# Patient Record
Sex: Male | Born: 1935 | Race: White | Hispanic: No | Smoking: Former smoker
Health system: Southern US, Community
[De-identification: ages and names within clinical notes are randomized; demographics above are authoritative.]

## PROBLEM LIST (undated history)

## (undated) DIAGNOSIS — Z87442 Personal history of urinary calculi: Secondary | ICD-10-CM

## (undated) DIAGNOSIS — M5030 Other cervical disc degeneration, unspecified cervical region: Secondary | ICD-10-CM

## (undated) DIAGNOSIS — J189 Pneumonia, unspecified organism: Secondary | ICD-10-CM

## (undated) DIAGNOSIS — Z9289 Personal history of other medical treatment: Secondary | ICD-10-CM

## (undated) DIAGNOSIS — I499 Cardiac arrhythmia, unspecified: Secondary | ICD-10-CM

## (undated) DIAGNOSIS — T4145XA Adverse effect of unspecified anesthetic, initial encounter: Secondary | ICD-10-CM

## (undated) DIAGNOSIS — R0981 Nasal congestion: Secondary | ICD-10-CM

## (undated) DIAGNOSIS — M1990 Unspecified osteoarthritis, unspecified site: Secondary | ICD-10-CM

## (undated) DIAGNOSIS — K5903 Drug induced constipation: Secondary | ICD-10-CM

## (undated) DIAGNOSIS — Z8719 Personal history of other diseases of the digestive system: Secondary | ICD-10-CM

## (undated) DIAGNOSIS — G5600 Carpal tunnel syndrome, unspecified upper limb: Secondary | ICD-10-CM

## (undated) DIAGNOSIS — T402X5A Adverse effect of other opioids, initial encounter: Secondary | ICD-10-CM

## (undated) DIAGNOSIS — E785 Hyperlipidemia, unspecified: Secondary | ICD-10-CM

## (undated) DIAGNOSIS — G629 Polyneuropathy, unspecified: Secondary | ICD-10-CM

## (undated) DIAGNOSIS — K219 Gastro-esophageal reflux disease without esophagitis: Secondary | ICD-10-CM

## (undated) DIAGNOSIS — N400 Enlarged prostate without lower urinary tract symptoms: Secondary | ICD-10-CM

## (undated) DIAGNOSIS — C801 Malignant (primary) neoplasm, unspecified: Secondary | ICD-10-CM

## (undated) DIAGNOSIS — M8580 Other specified disorders of bone density and structure, unspecified site: Secondary | ICD-10-CM

## (undated) DIAGNOSIS — I10 Essential (primary) hypertension: Secondary | ICD-10-CM

## (undated) DIAGNOSIS — M353 Polymyalgia rheumatica: Secondary | ICD-10-CM

## (undated) DIAGNOSIS — D649 Anemia, unspecified: Secondary | ICD-10-CM

## (undated) HISTORY — PX: FINGER SURGERY: SHX640

## (undated) HISTORY — PX: TONSILLECTOMY: SUR1361

## (undated) HISTORY — PX: HERNIA REPAIR: SHX51

## (undated) HISTORY — PX: EYE SURGERY: SHX253

## (undated) HISTORY — PX: CARPAL TUNNEL RELEASE: SHX101

## (undated) HISTORY — PX: COLONOSCOPY W/ POLYPECTOMY: SHX1380

## (undated) HISTORY — PX: CARDIAC CATHETERIZATION: SHX172

## (undated) HISTORY — PX: BACK SURGERY: SHX140

---
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
## 1972-05-02 HISTORY — PX: KNEE SURGERY: SHX244

## 2004-05-02 HISTORY — PX: REPLACEMENT TOTAL KNEE: SUR1224

## 2009-05-02 DIAGNOSIS — T8859XA Other complications of anesthesia, initial encounter: Secondary | ICD-10-CM

## 2009-05-02 HISTORY — PX: REPLACEMENT TOTAL KNEE: SUR1224

## 2009-05-02 HISTORY — DX: Other complications of anesthesia, initial encounter: T88.59XA

## 2015-03-09 ENCOUNTER — Other Ambulatory Visit (HOSPITAL_COMMUNITY): Payer: Self-pay | Admitting: Neurosurgery

## 2015-03-17 ENCOUNTER — Encounter (HOSPITAL_COMMUNITY): Payer: Self-pay

## 2015-03-17 ENCOUNTER — Encounter (HOSPITAL_COMMUNITY)
Admission: RE | Admit: 2015-03-17 | Discharge: 2015-03-17 | Disposition: A | Discharge status: Home / Self Care | Payer: Medicare Other | Source: Ambulatory Visit | Attending: Neurosurgery | Admitting: Neurosurgery

## 2015-03-17 HISTORY — DX: Essential (primary) hypertension: I10

## 2015-03-17 HISTORY — DX: Drug induced constipation: K59.03

## 2015-03-17 HISTORY — DX: Adverse effect of unspecified anesthetic, initial encounter: T41.45XA

## 2015-03-17 HISTORY — DX: Nasal congestion: R09.81

## 2015-03-17 HISTORY — DX: Personal history of other diseases of the digestive system: Z87.19

## 2015-03-17 HISTORY — DX: Carpal tunnel syndrome, unspecified upper limb: G56.00

## 2015-03-17 HISTORY — DX: Other cervical disc degeneration, unspecified cervical region: M50.30

## 2015-03-17 HISTORY — DX: Benign prostatic hyperplasia without lower urinary tract symptoms: N40.0

## 2015-03-17 HISTORY — DX: Gastro-esophageal reflux disease without esophagitis: K21.9

## 2015-03-17 HISTORY — DX: Unspecified osteoarthritis, unspecified site: M19.90

## 2015-03-17 HISTORY — DX: Anemia, unspecified: D64.9

## 2015-03-17 HISTORY — DX: Cardiac arrhythmia, unspecified: I49.9

## 2015-03-17 HISTORY — DX: Malignant (primary) neoplasm, unspecified: C80.1

## 2015-03-17 HISTORY — DX: Hyperlipidemia, unspecified: E78.5

## 2015-03-17 HISTORY — DX: Adverse effect of other opioids, initial encounter: T40.2X5A

## 2015-03-17 HISTORY — DX: Polyneuropathy, unspecified: G62.9

## 2015-03-17 HISTORY — DX: Other specified disorders of bone density and structure, unspecified site: M85.80

## 2015-03-17 HISTORY — DX: Polymyalgia rheumatica: M35.3

## 2015-03-17 LAB — BASIC METABOLIC PANEL
ANION GAP: 6 (ref 5–15)
BUN: 14 mg/dL (ref 6–20)
CO2: 27 mmol/L (ref 22–32)
Calcium: 9.4 mg/dL (ref 8.9–10.3)
Chloride: 107 mmol/L (ref 101–111)
Creatinine, Ser: 1.06 mg/dL (ref 0.61–1.24)
GFR calc non Af Amer: >60 mL/min (ref >60)
Glucose, Bld: 95 mg/dL (ref 65–99)
POTASSIUM: 4.4 mmol/L (ref 3.5–5.1)
SODIUM: 140 mmol/L (ref 135–145)

## 2015-03-17 LAB — CBC
HCT: 39.5 % (ref 39.0–52.0)
Hemoglobin: 13 g/dL (ref 13.0–17.0)
MCH: 33.5 pg (ref 26.0–34.0)
MCHC: 32.9 g/dL (ref 30.0–36.0)
MCV: 101.8 fL — AB (ref 78.0–100.0)
PLATELETS: 198 10*3/uL (ref 150–400)
RBC: 3.88 MIL/uL — AB (ref 4.22–5.81)
RDW: 15.8 % — ABNORMAL HIGH (ref 11.5–15.5)
WBC: 10.9 10*3/uL — ABNORMAL HIGH (ref 4.0–10.5)

## 2015-03-17 LAB — SURGICAL PCR SCREEN
MRSA, PCR: NEGATIVE
STAPHYLOCOCCUS AUREUS: NEGATIVE

## 2015-03-17 NOTE — Pre-Procedure Instructions (Signed)
    Kevin Carlson.  03/17/2015    Your procedure is scheduled on Friday, November 18.  Report to [facility] Admitting at 9:30 A.M.  Call this number if you have problems the morning of surgery:  [phone]   Remember:  Do not eat food or drink liquids after midnight Thursday, November 17.  Take these medicines the morning of surgery with A SIP OF WATER : gabapentin (NEURONTIN), lansoprazole (PREVACID), tamsulosin (FLOMAX).                  May take if needed: HYDROcodone-acetaminophen [facility])   Do not wear jewelry, make-up or nail polish.   Do not wear lotions, powders, or perfumes.               Men may shave face and neck.   Do not bring valuables to the hospital.   [facility] is not responsible for any belongings or valuables.  Contacts, dentures or bridgework may not be worn into surgery.  Leave your suitcase in the car.  After surgery it may be brought to your room.  For patients admitted to the hospital, discharge time will be determined by your treatment team.  Patients discharged the day of surgery will not be allowed to drive home.   Name and phone number of your driver:    Special instructions:  Review  [place] - Preparing For Surgery.  Please read over the following fact sheets that you were given. Pain Booklet, Coughing and Deep Breathing and Surgical Site Infection Prevention

## 2015-03-17 NOTE — Progress Notes (Signed)
Kevin Carlson reports that he had bleeding from esophagus after "having tube taken out of my throat in 2011 at [facility]."  I requested anesthesia records and D/C notes from that surgery.  I also requested EKG, Stress Test, Echo from Dr Alroy Dust, patients cardiologist.

## 2015-03-19 MED ORDER — CEFAZOLIN SODIUM-DEXTROSE 2-3 GM-% IV SOLR
2.0000 g | INTRAVENOUS | Status: AC
  Administered 2015-03-20: 2 g via INTRAVENOUS
  Filled 2015-03-19: qty 50

## 2015-03-19 NOTE — Discharge Instructions (Signed)

## 2015-03-20 ENCOUNTER — Ambulatory Visit (HOSPITAL_COMMUNITY): Payer: Medicare Other

## 2015-03-20 ENCOUNTER — Inpatient Hospital Stay (HOSPITAL_COMMUNITY)
Admission: AD | Admit: 2015-03-20 | Discharge: 2015-03-21 | DRG: 515 | Disposition: A | Discharge status: Home / Self Care | Payer: Medicare Other | Source: Ambulatory Visit | Attending: Neurosurgery | Admitting: Neurosurgery

## 2015-03-20 ENCOUNTER — Encounter (HOSPITAL_COMMUNITY): Payer: Self-pay | Admitting: Neurosurgery

## 2015-03-20 ENCOUNTER — Ambulatory Visit (HOSPITAL_COMMUNITY): Payer: Medicare Other | Admitting: Anesthesiology

## 2015-03-20 ENCOUNTER — Encounter (HOSPITAL_COMMUNITY)
Admission: AD | Disposition: A | Discharge status: Home / Self Care | Payer: Self-pay | Source: Ambulatory Visit | Attending: Neurosurgery

## 2015-03-20 DIAGNOSIS — M353 Polymyalgia rheumatica: Secondary | ICD-10-CM | POA: Diagnosis present

## 2015-03-20 DIAGNOSIS — R5082 Postprocedural fever: Secondary | ICD-10-CM | POA: Diagnosis not present

## 2015-03-20 DIAGNOSIS — M8580 Other specified disorders of bone density and structure, unspecified site: Secondary | ICD-10-CM | POA: Diagnosis present

## 2015-03-20 DIAGNOSIS — Z7952 Long term (current) use of systemic steroids: Secondary | ICD-10-CM | POA: Diagnosis not present

## 2015-03-20 DIAGNOSIS — I482 Chronic atrial fibrillation: Secondary | ICD-10-CM | POA: Diagnosis not present

## 2015-03-20 DIAGNOSIS — M1990 Unspecified osteoarthritis, unspecified site: Secondary | ICD-10-CM | POA: Diagnosis present

## 2015-03-20 DIAGNOSIS — M4806 Spinal stenosis, lumbar region: Secondary | ICD-10-CM | POA: Diagnosis present

## 2015-03-20 DIAGNOSIS — M5116 Intervertebral disc disorders with radiculopathy, lumbar region: Secondary | ICD-10-CM | POA: Diagnosis present

## 2015-03-20 DIAGNOSIS — Z885 Allergy status to narcotic agent status: Secondary | ICD-10-CM | POA: Diagnosis not present

## 2015-03-20 DIAGNOSIS — Z79899 Other long term (current) drug therapy: Secondary | ICD-10-CM | POA: Diagnosis not present

## 2015-03-20 DIAGNOSIS — M48061 Spinal stenosis, lumbar region without neurogenic claudication: Secondary | ICD-10-CM | POA: Diagnosis present

## 2015-03-20 DIAGNOSIS — Z85828 Personal history of other malignant neoplasm of skin: Secondary | ICD-10-CM | POA: Diagnosis not present

## 2015-03-20 DIAGNOSIS — I10 Essential (primary) hypertension: Secondary | ICD-10-CM | POA: Diagnosis present

## 2015-03-20 DIAGNOSIS — D62 Acute posthemorrhagic anemia: Secondary | ICD-10-CM | POA: Diagnosis not present

## 2015-03-20 DIAGNOSIS — K219 Gastro-esophageal reflux disease without esophagitis: Secondary | ICD-10-CM | POA: Diagnosis present

## 2015-03-20 DIAGNOSIS — M5030 Other cervical disc degeneration, unspecified cervical region: Secondary | ICD-10-CM | POA: Diagnosis present

## 2015-03-20 DIAGNOSIS — M47896 Other spondylosis, lumbar region: Secondary | ICD-10-CM | POA: Diagnosis present

## 2015-03-20 DIAGNOSIS — K5909 Other constipation: Secondary | ICD-10-CM | POA: Diagnosis present

## 2015-03-20 DIAGNOSIS — E882 Lipomatosis, not elsewhere classified: Secondary | ICD-10-CM | POA: Diagnosis present

## 2015-03-20 DIAGNOSIS — N400 Enlarged prostate without lower urinary tract symptoms: Secondary | ICD-10-CM | POA: Diagnosis present

## 2015-03-20 DIAGNOSIS — Z8249 Family history of ischemic heart disease and other diseases of the circulatory system: Secondary | ICD-10-CM | POA: Diagnosis not present

## 2015-03-20 DIAGNOSIS — Z96653 Presence of artificial knee joint, bilateral: Secondary | ICD-10-CM | POA: Diagnosis present

## 2015-03-20 DIAGNOSIS — K5900 Constipation, unspecified: Secondary | ICD-10-CM | POA: Diagnosis not present

## 2015-03-20 DIAGNOSIS — I4891 Unspecified atrial fibrillation: Secondary | ICD-10-CM | POA: Diagnosis not present

## 2015-03-20 DIAGNOSIS — Z79891 Long term (current) use of opiate analgesic: Secondary | ICD-10-CM | POA: Diagnosis not present

## 2015-03-20 DIAGNOSIS — E785 Hyperlipidemia, unspecified: Secondary | ICD-10-CM | POA: Diagnosis present

## 2015-03-20 DIAGNOSIS — G629 Polyneuropathy, unspecified: Secondary | ICD-10-CM | POA: Diagnosis present

## 2015-03-20 DIAGNOSIS — A419 Sepsis, unspecified organism: Secondary | ICD-10-CM | POA: Diagnosis not present

## 2015-03-20 DIAGNOSIS — Z419 Encounter for procedure for purposes other than remedying health state, unspecified: Secondary | ICD-10-CM

## 2015-03-20 HISTORY — PX: LUMBAR LAMINECTOMY/DECOMPRESSION MICRODISCECTOMY: SHX5026

## 2015-03-20 SURGERY — LUMBAR LAMINECTOMY/DECOMPRESSION MICRODISCECTOMY 2 LEVELS
Anesthesia: General

## 2015-03-20 MED ORDER — ONDANSETRON HCL 4 MG/2ML IJ SOLN
INTRAMUSCULAR | Status: DC | PRN
  Administered 2015-03-20: 4 mg via INTRAVENOUS

## 2015-03-20 MED ORDER — BUPIVACAINE HCL (PF) 0.5 % IJ SOLN
INTRAMUSCULAR | Status: DC | PRN
  Administered 2015-03-20: 5 mL
  Administered 2015-03-20: 10 mL

## 2015-03-20 MED ORDER — SODIUM CHLORIDE 0.9 % IJ SOLN
3.0000 mL | Freq: Two times a day (BID) | INTRAMUSCULAR | Status: DC
  Administered 2015-03-20 (×2): 3 mL via INTRAVENOUS

## 2015-03-20 MED ORDER — SUGAMMADEX SODIUM 200 MG/2ML IV SOLN
INTRAVENOUS | Status: AC
  Filled 2015-03-20: qty 2

## 2015-03-20 MED ORDER — ONDANSETRON HCL 4 MG/2ML IJ SOLN
INTRAMUSCULAR | Status: AC
  Filled 2015-03-20: qty 2

## 2015-03-20 MED ORDER — 0.9 % SODIUM CHLORIDE (POUR BTL) OPTIME
TOPICAL | Status: DC | PRN
Start: 2015-03-20 — End: 2015-03-20
  Administered 2015-03-20: 1000 mL

## 2015-03-20 MED ORDER — KCL IN DEXTROSE-NACL 20-5-0.45 MEQ/L-%-% IV SOLN
INTRAVENOUS | Status: DC
  Filled 2015-03-20 (×3): qty 1000

## 2015-03-20 MED ORDER — GLYCOPYRROLATE 0.2 MG/ML IJ SOLN
INTRAMUSCULAR | Status: DC | PRN
  Administered 2015-03-20: 0.2 mg via INTRAVENOUS

## 2015-03-20 MED ORDER — HYDROMORPHONE HCL 1 MG/ML IJ SOLN
INTRAMUSCULAR | Status: AC
  Filled 2015-03-20: qty 1

## 2015-03-20 MED ORDER — BENAZEPRIL HCL 10 MG PO TABS
10.0000 mg | ORAL_TABLET | Freq: Every day | ORAL | Status: DC
  Administered 2015-03-20: 10 mg via ORAL
  Filled 2015-03-20 (×2): qty 1

## 2015-03-20 MED ORDER — ARTIFICIAL TEARS OP OINT
TOPICAL_OINTMENT | OPHTHALMIC | Status: DC | PRN
  Administered 2015-03-20: 1 via OPHTHALMIC

## 2015-03-20 MED ORDER — POLYETHYLENE GLYCOL 3350 17 G PO PACK: 17.0000 g | PACK | Freq: Every day | ORAL | Status: DC | PRN

## 2015-03-20 MED ORDER — LACTATED RINGERS IV SOLN
INTRAVENOUS | Status: DC
  Administered 2015-03-20: 12:00:00 via INTRAVENOUS
  Administered 2015-03-20: 50 mL/h via INTRAVENOUS

## 2015-03-20 MED ORDER — PHENYLEPHRINE HCL 10 MG/ML IJ SOLN
INTRAMUSCULAR | Status: AC
  Filled 2015-03-20: qty 1

## 2015-03-20 MED ORDER — ALUM & MAG HYDROXIDE-SIMETH 200-200-20 MG/5ML PO SUSP
30.0000 mL | Freq: Four times a day (QID) | ORAL | Status: DC | PRN
  Administered 2015-03-21: 30 mL via ORAL
  Filled 2015-03-20: qty 30

## 2015-03-20 MED ORDER — SIMVASTATIN 40 MG PO TABS
40.0000 mg | ORAL_TABLET | Freq: Every day | ORAL | Status: DC
  Administered 2015-03-20: 40 mg via ORAL
  Filled 2015-03-20 (×2): qty 1

## 2015-03-20 MED ORDER — OXYCODONE-ACETAMINOPHEN 5-325 MG PO TABS
ORAL_TABLET | ORAL | Status: AC
  Filled 2015-03-20: qty 2

## 2015-03-20 MED ORDER — FLEET ENEMA 7-19 GM/118ML RE ENEM: 1.0000 | ENEMA | Freq: Once | RECTAL | Status: DC | PRN

## 2015-03-20 MED ORDER — SUGAMMADEX SODIUM 200 MG/2ML IV SOLN
INTRAVENOUS | Status: DC | PRN
  Administered 2015-03-20: 200 mg via INTRAVENOUS

## 2015-03-20 MED ORDER — HYDROCODONE-ACETAMINOPHEN 10-325 MG PO TABS: 1.0000 | ORAL_TABLET | Freq: Four times a day (QID) | ORAL | Status: DC | PRN

## 2015-03-20 MED ORDER — METOPROLOL TARTRATE 12.5 MG HALF TABLET
ORAL_TABLET | ORAL | Status: AC
  Administered 2015-03-20: 25 mg
  Filled 2015-03-20: qty 2

## 2015-03-20 MED ORDER — ACETAMINOPHEN 325 MG PO TABS: 650.0000 mg | ORAL_TABLET | ORAL | Status: DC | PRN

## 2015-03-20 MED ORDER — TIZANIDINE HCL 4 MG PO CAPS: 4.0000 mg | ORAL_CAPSULE | Freq: Every day | ORAL | Status: DC

## 2015-03-20 MED ORDER — PHENYLEPHRINE 40 MCG/ML (10ML) SYRINGE FOR IV PUSH (FOR BLOOD PRESSURE SUPPORT)
PREFILLED_SYRINGE | INTRAVENOUS | Status: AC
  Filled 2015-03-20: qty 10

## 2015-03-20 MED ORDER — GABAPENTIN 600 MG PO TABS
600.0000 mg | ORAL_TABLET | Freq: Two times a day (BID) | ORAL | Status: DC
  Administered 2015-03-20 – 2015-03-21 (×2): 600 mg via ORAL
  Filled 2015-03-20 (×2): qty 1

## 2015-03-20 MED ORDER — OXYCODONE-ACETAMINOPHEN 5-325 MG PO TABS
1.0000 | ORAL_TABLET | ORAL | Status: DC | PRN
  Administered 2015-03-20 – 2015-03-21 (×4): 2 via ORAL
  Filled 2015-03-20 (×3): qty 2

## 2015-03-20 MED ORDER — PANTOPRAZOLE SODIUM 20 MG PO TBEC: 20.0000 mg | DELAYED_RELEASE_TABLET | Freq: Every day | ORAL | Status: DC

## 2015-03-20 MED ORDER — ROCURONIUM BROMIDE 100 MG/10ML IV SOLN
INTRAVENOUS | Status: DC | PRN
  Administered 2015-03-20: 40 mg via INTRAVENOUS

## 2015-03-20 MED ORDER — METHOCARBAMOL 500 MG PO TABS
ORAL_TABLET | ORAL | Status: AC
  Filled 2015-03-20: qty 1

## 2015-03-20 MED ORDER — TAMSULOSIN HCL 0.4 MG PO CAPS
0.4000 mg | ORAL_CAPSULE | Freq: Every day | ORAL | Status: DC
  Administered 2015-03-20: 0.4 mg via ORAL
  Filled 2015-03-20 (×2): qty 1

## 2015-03-20 MED ORDER — FENTANYL CITRATE (PF) 250 MCG/5ML IJ SOLN
INTRAMUSCULAR | Status: AC
  Filled 2015-03-20: qty 5

## 2015-03-20 MED ORDER — LIDOCAINE-EPINEPHRINE 1 %-1:100000 IJ SOLN
INTRAMUSCULAR | Status: DC | PRN
  Administered 2015-03-20: 5 mL
  Administered 2015-03-20: 10 mL

## 2015-03-20 MED ORDER — MENTHOL 3 MG MT LOZG
1.0000 | LOZENGE | OROMUCOSAL | Status: DC | PRN
Start: 2015-03-20 — End: 2015-03-21

## 2015-03-20 MED ORDER — HYDROMORPHONE HCL 1 MG/ML IJ SOLN
0.2500 mg | INTRAMUSCULAR | Status: DC | PRN
  Administered 2015-03-20 (×4): 0.5 mg via INTRAVENOUS

## 2015-03-20 MED ORDER — SODIUM CHLORIDE 0.9 % IJ SOLN: 3.0000 mL | INTRAMUSCULAR | Status: DC | PRN

## 2015-03-20 MED ORDER — TIZANIDINE HCL 4 MG PO TABS
4.0000 mg | ORAL_TABLET | Freq: Every day | ORAL | Status: DC
  Administered 2015-03-20: 4 mg via ORAL
  Filled 2015-03-20 (×2): qty 1

## 2015-03-20 MED ORDER — AMLODIPINE BESYLATE 5 MG PO TABS
5.0000 mg | ORAL_TABLET | Freq: Every day | ORAL | Status: DC
  Administered 2015-03-20: 5 mg via ORAL
  Filled 2015-03-20 (×2): qty 1

## 2015-03-20 MED ORDER — AMLODIPINE BESY-BENAZEPRIL HCL 5-10 MG PO CAPS: 1.0000 | ORAL_CAPSULE | Freq: Every day | ORAL | Status: DC

## 2015-03-20 MED ORDER — LIDOCAINE HCL (CARDIAC) 20 MG/ML IV SOLN
INTRAVENOUS | Status: DC | PRN
  Administered 2015-03-20: 100 mg via INTRAVENOUS

## 2015-03-20 MED ORDER — PROPOFOL 10 MG/ML IV BOLUS
INTRAVENOUS | Status: DC | PRN
  Administered 2015-03-20: 140 mg via INTRAVENOUS

## 2015-03-20 MED ORDER — FENTANYL CITRATE (PF) 100 MCG/2ML IJ SOLN
INTRAMUSCULAR | Status: DC | PRN
  Administered 2015-03-20 (×2): 50 ug via INTRAVENOUS

## 2015-03-20 MED ORDER — SUCCINYLCHOLINE CHLORIDE 20 MG/ML IJ SOLN
INTRAMUSCULAR | Status: AC
  Filled 2015-03-20: qty 1

## 2015-03-20 MED ORDER — ONDANSETRON HCL 4 MG/2ML IJ SOLN: 4.0000 mg | INTRAMUSCULAR | Status: DC | PRN

## 2015-03-20 MED ORDER — TAMSULOSIN HCL 0.4 MG PO CAPS
0.4000 mg | ORAL_CAPSULE | Freq: Once | ORAL | Status: AC
  Administered 2015-03-20: 0.4 mg via ORAL

## 2015-03-20 MED ORDER — CEFAZOLIN SODIUM 1-5 GM-% IV SOLN
1.0000 g | Freq: Three times a day (TID) | INTRAVENOUS | Status: AC
  Administered 2015-03-20 – 2015-03-21 (×2): 1 g via INTRAVENOUS
  Filled 2015-03-20 (×2): qty 50

## 2015-03-20 MED ORDER — HYDROMORPHONE HCL 1 MG/ML IJ SOLN
0.5000 mg | INTRAMUSCULAR | Status: DC | PRN
  Administered 2015-03-20: 1 mg via INTRAVENOUS
  Filled 2015-03-20: qty 1

## 2015-03-20 MED ORDER — PHENYLEPHRINE HCL 10 MG/ML IJ SOLN
INTRAMUSCULAR | Status: DC | PRN
  Administered 2015-03-20 (×2): 80 ug via INTRAVENOUS
  Administered 2015-03-20: 120 ug via INTRAVENOUS

## 2015-03-20 MED ORDER — MEPERIDINE HCL 25 MG/ML IJ SOLN: 6.2500 mg | INTRAMUSCULAR | Status: DC | PRN

## 2015-03-20 MED ORDER — METHOCARBAMOL 1000 MG/10ML IJ SOLN
500.0000 mg | Freq: Four times a day (QID) | INTRAMUSCULAR | Status: DC | PRN
  Filled 2015-03-20: qty 5

## 2015-03-20 MED ORDER — PROMETHAZINE HCL 25 MG/ML IJ SOLN: 6.2500 mg | INTRAMUSCULAR | Status: DC | PRN

## 2015-03-20 MED ORDER — LIDOCAINE HCL (CARDIAC) 20 MG/ML IV SOLN
INTRAVENOUS | Status: AC
  Filled 2015-03-20: qty 5

## 2015-03-20 MED ORDER — HYDROCODONE-ACETAMINOPHEN 5-325 MG PO TABS: 1.0000 | ORAL_TABLET | ORAL | Status: DC | PRN

## 2015-03-20 MED ORDER — THROMBIN 5000 UNITS EX SOLR
CUTANEOUS | Status: DC | PRN
  Administered 2015-03-20 (×2): 5000 [IU] via TOPICAL

## 2015-03-20 MED ORDER — PANTOPRAZOLE SODIUM 40 MG IV SOLR
40.0000 mg | Freq: Every day | INTRAVENOUS | Status: DC
  Administered 2015-03-20: 40 mg via INTRAVENOUS
  Filled 2015-03-20: qty 40

## 2015-03-20 MED ORDER — BISACODYL 10 MG RE SUPP: 10.0000 mg | Freq: Every day | RECTAL | Status: DC | PRN

## 2015-03-20 MED ORDER — DEXAMETHASONE SODIUM PHOSPHATE 4 MG/ML IJ SOLN
INTRAMUSCULAR | Status: AC
  Filled 2015-03-20: qty 1

## 2015-03-20 MED ORDER — METHOCARBAMOL 500 MG PO TABS
500.0000 mg | ORAL_TABLET | Freq: Four times a day (QID) | ORAL | Status: DC | PRN
Start: 2015-03-20 — End: 2015-03-21
  Administered 2015-03-20 – 2015-03-21 (×3): 500 mg via ORAL
  Filled 2015-03-20 (×2): qty 1

## 2015-03-20 MED ORDER — HEMOSTATIC AGENTS (NO CHARGE) OPTIME
TOPICAL | Status: DC | PRN
  Administered 2015-03-20: 1 via TOPICAL

## 2015-03-20 MED ORDER — ARTIFICIAL TEARS OP OINT
TOPICAL_OINTMENT | OPHTHALMIC | Status: AC
  Filled 2015-03-20: qty 3.5

## 2015-03-20 MED ORDER — EPHEDRINE SULFATE 50 MG/ML IJ SOLN
INTRAMUSCULAR | Status: DC | PRN
  Administered 2015-03-20: 5 mg via INTRAVENOUS

## 2015-03-20 MED ORDER — METOPROLOL TARTRATE 25 MG PO TABS
25.0000 mg | ORAL_TABLET | Freq: Every day | ORAL | Status: DC
  Administered 2015-03-21: 25 mg via ORAL
  Filled 2015-03-20 (×2): qty 1

## 2015-03-20 MED ORDER — SODIUM CHLORIDE 0.9 % IV SOLN
10.0000 mg | INTRAVENOUS | Status: DC | PRN
  Administered 2015-03-20: 10 ug/min via INTRAVENOUS

## 2015-03-20 MED ORDER — PHENOL 1.4 % MT LIQD: 1.0000 | OROMUCOSAL | Status: DC | PRN

## 2015-03-20 MED ORDER — ACETAMINOPHEN 650 MG RE SUPP: 650.0000 mg | RECTAL | Status: DC | PRN

## 2015-03-20 MED ORDER — PROPOFOL 10 MG/ML IV BOLUS
INTRAVENOUS | Status: AC
  Filled 2015-03-20: qty 20

## 2015-03-20 MED ORDER — DOCUSATE SODIUM 100 MG PO CAPS
100.0000 mg | ORAL_CAPSULE | Freq: Two times a day (BID) | ORAL | Status: DC
  Administered 2015-03-21: 100 mg via ORAL
  Filled 2015-03-20 (×2): qty 1

## 2015-03-20 MED ORDER — PREDNISONE 5 MG PO TABS
5.0000 mg | ORAL_TABLET | Freq: Every day | ORAL | Status: DC
  Administered 2015-03-21: 5 mg via ORAL
  Filled 2015-03-20 (×2): qty 1

## 2015-03-20 SURGICAL SUPPLY — 55 items
BENZOIN TINCTURE PRP APPL 2/3 (GAUZE/BANDAGES/DRESSINGS) IMPLANT
BIT DRILL NEURO 2X3.1 SFT TUCH (MISCELLANEOUS) ×1 IMPLANT
BLADE CLIPPER SURG (BLADE) IMPLANT
BUR ROUND FLUTED 5 RND (BURR) ×2 IMPLANT
BUR ROUND FLUTED 5MM RND (BURR) ×1
CANISTER SUCT 3000ML PPV (MISCELLANEOUS) ×3 IMPLANT
CLOSURE WOUND 1/2 X4 (GAUZE/BANDAGES/DRESSINGS)
DECANTER SPIKE VIAL GLASS SM (MISCELLANEOUS) ×3 IMPLANT
DERMABOND ADVANCED (GAUZE/BANDAGES/DRESSINGS) ×2
DERMABOND ADVANCED .7 DNX12 (GAUZE/BANDAGES/DRESSINGS) ×1 IMPLANT
DRAPE LAPAROTOMY 100X72X124 (DRAPES) ×3 IMPLANT
DRAPE MICROSCOPE LEICA (MISCELLANEOUS) ×3 IMPLANT
DRAPE POUCH INSTRU U-SHP 10X18 (DRAPES) ×3 IMPLANT
DRAPE SURG 17X23 STRL (DRAPES) ×3 IMPLANT
DRILL NEURO 2X3.1 SOFT TOUCH (MISCELLANEOUS) ×3
DRSG OPSITE POSTOP 4X6 (GAUZE/BANDAGES/DRESSINGS) ×3 IMPLANT
DURAPREP 26ML APPLICATOR (WOUND CARE) ×3 IMPLANT
ELECT REM PT RETURN 9FT ADLT (ELECTROSURGICAL) ×3
ELECTRODE REM PT RTRN 9FT ADLT (ELECTROSURGICAL) ×1 IMPLANT
GAUZE SPONGE 4X4 12PLY STRL (GAUZE/BANDAGES/DRESSINGS) IMPLANT
GAUZE SPONGE 4X4 16PLY XRAY LF (GAUZE/BANDAGES/DRESSINGS) IMPLANT
GLOVE BIO SURGEON STRL SZ8 (GLOVE) ×6 IMPLANT
GLOVE BIOGEL PI IND STRL 8 (GLOVE) ×2 IMPLANT
GLOVE BIOGEL PI IND STRL 8.5 (GLOVE) ×2 IMPLANT
GLOVE BIOGEL PI INDICATOR 8 (GLOVE) ×4
GLOVE BIOGEL PI INDICATOR 8.5 (GLOVE) ×4
GLOVE ECLIPSE 8.0 STRL XLNG CF (GLOVE) ×6 IMPLANT
GLOVE EXAM NITRILE LRG STRL (GLOVE) IMPLANT
GLOVE EXAM NITRILE MD LF STRL (GLOVE) IMPLANT
GLOVE EXAM NITRILE XL STR (GLOVE) IMPLANT
GLOVE EXAM NITRILE XS STR PU (GLOVE) IMPLANT
GOWN STRL REUS W/ TWL LRG LVL3 (GOWN DISPOSABLE) IMPLANT
GOWN STRL REUS W/ TWL XL LVL3 (GOWN DISPOSABLE) ×1 IMPLANT
GOWN STRL REUS W/TWL 2XL LVL3 (GOWN DISPOSABLE) ×3 IMPLANT
GOWN STRL REUS W/TWL LRG LVL3 (GOWN DISPOSABLE)
GOWN STRL REUS W/TWL XL LVL3 (GOWN DISPOSABLE) ×2
KIT BASIN OR (CUSTOM PROCEDURE TRAY) ×3 IMPLANT
KIT ROOM TURNOVER OR (KITS) ×3 IMPLANT
NEEDLE HYPO 18GX1.5 BLUNT FILL (NEEDLE) IMPLANT
NEEDLE HYPO 25X1 1.5 SAFETY (NEEDLE) ×3 IMPLANT
NS IRRIG 1000ML POUR BTL (IV SOLUTION) ×3 IMPLANT
PACK LAMINECTOMY NEURO (CUSTOM PROCEDURE TRAY) ×3 IMPLANT
PAD ARMBOARD 7.5X6 YLW CONV (MISCELLANEOUS) ×9 IMPLANT
RUBBERBAND STERILE (MISCELLANEOUS) ×6 IMPLANT
SPONGE SURGIFOAM ABS GEL SZ50 (HEMOSTASIS) ×3 IMPLANT
STAPLER SKIN PROX WIDE 3.9 (STAPLE) IMPLANT
STRIP CLOSURE SKIN 1/2X4 (GAUZE/BANDAGES/DRESSINGS) IMPLANT
SUT VIC AB 0 CT1 18XCR BRD8 (SUTURE) ×1 IMPLANT
SUT VIC AB 0 CT1 8-18 (SUTURE) ×2
SUT VIC AB 2-0 CT1 18 (SUTURE) ×3 IMPLANT
SUT VIC AB 3-0 SH 8-18 (SUTURE) ×6 IMPLANT
SYR 5ML LL (SYRINGE) IMPLANT
TOWEL OR 17X24 6PK STRL BLUE (TOWEL DISPOSABLE) ×3 IMPLANT
TOWEL OR 17X26 10 PK STRL BLUE (TOWEL DISPOSABLE) ×3 IMPLANT
WATER STERILE IRR 1000ML POUR (IV SOLUTION) ×3 IMPLANT

## 2015-03-20 NOTE — Progress Notes (Signed)
Awake, alert, conversant.  MAEW with full strength.  No numbness.  Doing well.   

## 2015-03-20 NOTE — Op Note (Signed)
03/20/2015  12:51 PM  PATIENT:  Kevin Carlson.  81 y.o. 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 levels  PROCEDURE:  Procedure(s): Decompressive lumbar laminectomy L3-L5  (N/A) with decompression of bilateral L 3, L 4, L 5, S 1 nerve roots  SURGEON:  Surgeon(s) and Role:    * Erline Levine, MD - Primary    * Leeroy Cha, MD - Assisting  PHYSICIAN ASSISTANT:   ASSISTANTS: Poteat, RN   ANESTHESIA:   local and general  EBL:  Total I/O In: 56 [I.V.:1025] Out: 100 [Blood:100]  BLOOD ADMINISTERED:none  DRAINS: none   LOCAL MEDICATIONS USED:  MARCAINE    and LIDOCAINE   SPECIMEN:  No Specimen  DISPOSITION OF SPECIMEN:  N/A  COUNTS:  YES  TOURNIQUET:  * No tourniquets in log *  DICTATION: Patient has severe spinal stenosis at L 3 - 5 with significant leg pain and neurogenic claudication. It was elected to take him to surgery for L3 through L5 laminectomy and decompression of both L 3, L 4, L5 , S 1 nerve roots.  Procedure: Patient was brought to the operating room and following the smooth and uncomplicated induction of general endotracheal anesthesia he was placed in a prone position on the Wilson frame. Low back was prepped and draped in the usual sterile fashion with betadine scrub and DuraPrep. Area of planned incision was infiltrated with local lidocaine. Incision was made in the midline and carried to the lumbodorsal fascia which was incised bilaterally. Subperiosteal dissection was performed exposing what was felt to be L 3 - 5 levels. Intraoperative x-ray demonstrated marker probes at L 45.  A total laminectomy of L 3, L 4 and L5 was performed with leksell, then a high-speed drill and completed with Kerrison rongeurs and generous foraminotomies were  performed to decompress the L 3, L 4, L 5, and S 1 nerves bilaterally. Ligamentum flavum was detached and removed in a piecemeal fashion.  Angled curettes were used to detatch and then remove thickened compressive ligamentous material.  At this point it was felt that all neural elements were well decompressed. The wound was then irrigated with copious saline.  The lumbodorsal fascia was closed with 0 Vicryl sutures the subcutaneous tissues reapproximated 2-0 Vicryl inverted sutures and the skin edges were reapproximated with 3-0 Vicryl subcuticular stitch. The wound is dressed with Dermabond and an occlusive dressing. Patient was extubated in the operating room and taken to recovery in stable and satisfactory condition having tolerated his operation well counts were correct at the end of the case.    PLAN OF CARE: Admit to inpatient   PATIENT DISPOSITION:  PACU - hemodynamically stable.   Delay start of Pharmacological VTE agent (>24hrs) due to surgical blood loss or risk of bleeding: yes

## 2015-03-20 NOTE — Anesthesia Procedure Notes (Signed)
Procedure Name: Intubation Date/Time: 03/20/2015 11:00 AM Performed by: Rogers Blocker Pre-anesthesia Checklist: Patient identified, Timeout performed, Emergency Drugs available, Suction available and Patient being monitored Patient Re-evaluated:Patient Re-evaluated prior to inductionOxygen Delivery Method: Circle system utilized Preoxygenation: Pre-oxygenation with 100% oxygen Intubation Type: IV induction Ventilation: Mask ventilation without difficulty Laryngoscope Size: Mac and 4 Grade View: Grade I Tube type: Oral Tube size: 7.0 mm Number of attempts: 1 Airway Equipment and Method: Stylet Placement Confirmation: ETT inserted through vocal cords under direct vision,  breath sounds checked- equal and bilateral,  positive ETCO2 and CO2 detector Secured at: 22 cm Tube secured with: Tape Dental Injury: Teeth and Oropharynx as per pre-operative assessment

## 2015-03-20 NOTE — Anesthesia Preprocedure Evaluation (Signed)
Anesthesia Evaluation  Patient identified by MRN, date of birth, ID band Patient awake    Reviewed: Allergy & Precautions, NPO status , Patient's Chart, lab work & pertinent test results, reviewed documented beta blocker date and time   Airway Mallampati: II  TM Distance: >3 FB Neck ROM: Full    Dental no notable dental hx.    Pulmonary neg pulmonary ROS, Current Smoker,    Pulmonary exam normal breath sounds clear to auscultation       Cardiovascular hypertension, Pt. on medications and Pt. on home beta blockers Normal cardiovascular exam+ dysrhythmias  Rhythm:Regular Rate:Normal     Neuro/Psych  Neuromuscular disease negative psych ROS   GI/Hepatic Neg liver ROS, hiatal hernia, GERD  ,  Endo/Other  negative endocrine ROS  Renal/GU negative Renal ROS     Musculoskeletal  (+) Arthritis ,   Abdominal   Peds  Hematology  (+) anemia ,   Anesthesia Other Findings   Reproductive/Obstetrics                             Anesthesia Physical Anesthesia Plan  ASA: III  Anesthesia Plan: General   Post-op Pain Management:    Induction: Intravenous  Airway Management Planned: Oral ETT  Additional Equipment:   Intra-op Plan:   Post-operative Plan: Extubation in OR  Informed Consent: I have reviewed the patients History and Physical, chart, labs and discussed the procedure including the risks, benefits and alternatives for the proposed anesthesia with the patient or authorized representative who has indicated his/her understanding and acceptance.   Dental advisory given  Plan Discussed with: CRNA  Anesthesia Plan Comments:         Anesthesia Quick Evaluation

## 2015-03-20 NOTE — Interval H&P Note (Signed)
History and Physical Interval Note:  03/20/2015 8:00 AM  Kevin Carlson.  has presented today for surgery, with the diagnosis of Stenosis  The various methods of treatment have been discussed with the patient and family. After consideration of risks, benefits and other options for treatment, the patient has consented to  Procedure(s): Decompressive lumbar laminectomy L3-L5  (N/A) as a surgical intervention .  The patient's history has been reviewed, patient examined, no change in status, stable for surgery.  I have reviewed the patient's chart and labs.  Questions were answered to the patient's satisfaction.     Lynleigh Kovack D

## 2015-03-20 NOTE — Brief Op Note (Signed)
03/20/2015  12:51 PM  PATIENT:  Kevin Carlson.  81 y.o. 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 levels  PROCEDURE:  Procedure(s): Decompressive lumbar laminectomy L3-L5  (N/A) with decompression of bilateral L 3, L 4, L 5, S 1 nerve roots  SURGEON:  Surgeon(s) and Role:    * Erline Levine, MD - Primary    * Leeroy Cha, MD - Assisting  PHYSICIAN ASSISTANT:   ASSISTANTS: Poteat, RN   ANESTHESIA:   local and general  EBL:  Total I/O In: 75 [I.V.:1025] Out: 100 [Blood:100]  BLOOD ADMINISTERED:none  DRAINS: none   LOCAL MEDICATIONS USED:  MARCAINE    and LIDOCAINE   SPECIMEN:  No Specimen  DISPOSITION OF SPECIMEN:  N/A  COUNTS:  YES  TOURNIQUET:  * No tourniquets in log *  DICTATION: Patient has severe spinal stenosis at L 3 - 5 with significant leg pain and neurogenic claudication. It was elected to take him to surgery for L3 through L5 laminectomy and decompression of both L 3, L 4, L5 , S 1 nerve roots.  Procedure: Patient was brought to the operating room and following the smooth and uncomplicated induction of general endotracheal anesthesia he was placed in a prone position on the Wilson frame. Low back was prepped and draped in the usual sterile fashion with betadine scrub and DuraPrep. Area of planned incision was infiltrated with local lidocaine. Incision was made in the midline and carried to the lumbodorsal fascia which was incised bilaterally. Subperiosteal dissection was performed exposing what was felt to be L 3 - 5 levels. Intraoperative x-ray demonstrated marker probes at L 45.  A total laminectomy of L 3, L 4 and L5 was performed with leksell, then a high-speed drill and completed with Kerrison rongeurs and generous foraminotomies were  performed to decompress the L 3, L 4, L 5, and S 1 nerves bilaterally. Ligamentum flavum was detached and removed in a piecemeal fashion.  Angled curettes were used to detatch and then remove thickened compressive ligamentous material.  At this point it was felt that all neural elements were well decompressed. The wound was then irrigated with copious saline.  The lumbodorsal fascia was closed with 0 Vicryl sutures the subcutaneous tissues reapproximated 2-0 Vicryl inverted sutures and the skin edges were reapproximated with 3-0 Vicryl subcuticular stitch. The wound is dressed with Dermabond and an occlusive dressing. Patient was extubated in the operating room and taken to recovery in stable and satisfactory condition having tolerated his operation well counts were correct at the end of the case.    PLAN OF CARE: Admit to inpatient   PATIENT DISPOSITION:  PACU - hemodynamically stable.   Delay start of Pharmacological VTE agent (>24hrs) due to surgical blood loss or risk of bleeding: yes

## 2015-03-20 NOTE — Anesthesia Postprocedure Evaluation (Signed)
Anesthesia Post Note  Patient: Kevin Carlson.  Procedure(s) Performed: Procedure(s) (LRB): Decompressive lumbar laminectomy L3-L5  (N/A)  Anesthesia type: General  Patient location: PACU  Post pain: Pain level controlled  Post assessment: Post-op Vital signs reviewed  Last Vitals: BP 126/68 mmHg  Pulse 73  Temp(Src) 36.4 C (Oral)  Resp 18  Ht 6\' 2"  (1.88 m)  Wt 195 lb 4.8 oz (88.587 kg)  BMI 25.06 kg/m2  SpO2 73%  Post vital signs: Reviewed  Level of consciousness: sedated  Complications: No apparent anesthesia complications

## 2015-03-20 NOTE — Transfer of Care (Signed)
Immediate Anesthesia Transfer of Care Note  Patient: Kevin Carlson.  Procedure(s) Performed: Procedure(s): Decompressive lumbar laminectomy L3-L5  (N/A)  Patient Location: PACU  Anesthesia Type:General  Level of Consciousness: awake, alert , oriented and patient cooperative  Airway & Oxygen Therapy: Patient Spontanous Breathing and Patient connected to face mask oxygen  Post-op Assessment: Report given to RN, Post -op Vital signs reviewed and stable and Patient moving all extremities X 4  Post vital signs: Reviewed and stable  Last Vitals:  Filed Vitals:   03/20/15 0838  BP: 139/99  Pulse: 78  Temp: 36.5 C  Resp: 20    Complications: No apparent anesthesia complications

## 2015-03-20 NOTE — H&P (Signed)
Patient ID:   [id number] Patient: Kevin Carlson  Date of Birth: 02/13/1929 Visit Type: Office Visit   Date: 03/04/2015 03:30 PM Provider: Marchia Meiers. Vertell Limber MD   This 81 year old male presents for back pain and Leg pain.  History of Present Illness: 1.  back pain  2.  Leg pain  Kevin Carlson, 81 year old retired male visits reporting active and bilateral lower extremity pain 3 months.  Patient notes pain increases with standing or walking.  ESI 3 in July offered no relief  Norco 10/325 taken twice daily Gabapentin 600 mg twice a day Tizanidine 4 mg daily at bedtime  History: HTN, CTS, GERD, stomach rupture 1940 12/18/49 Surgical history: Carpal tunnel release right two years ago, left total knee replacement 2006, right total knee replacement 2011, tonsillectomy 1949  MRI uploaded to [facility]  The MRI demonstrates significant spinal stenosis L3 through L5 levels.  Lumbar radiographs were obtained with flexion and extension views which do not demonstrate significant instability.  There does not appear to be significant spondylolisthesis at any level.  The patient complains of low back pain and bilateral lower extremity pain and says that it is zero out of 10 with sitting and 9 out of 10 with standing.  Her much worse over the last 3 months.  She cannot walk more than 2 minutes before he developed significant pain in both of his legs radiating all the way into his calf.  He says he has to sit after being upright for about 5-10 minutes.        PAST MEDICAL/SURGICAL HISTORY   (Detailed)  Disease/disorder Onset Date Management Date Comments    Addominal surgery  CRR 02/20/2015 -    Cataract extraction      Hernia repair      Fixation of knee joint  CRR 02/20/2015 -    Coronary angioplasty  CRR 02/20/2015 -    Tonsillectomy      Knee replacement 2011     Knee replacement 2006   Arthritis      GERD      Hyperlipidemia    CRR 02/20/2015 -  Hypertension         PAST MEDICAL  HISTORY, SURGICAL HISTORY, FAMILY HISTORY, SOCIAL HISTORY AND REVIEW OF SYSTEMS I have reviewed the patient's past medical, surgical, family and social history as well as the comprehensive review of systems as included on the [facility] history form dated 03/04/2015, which I have signed.  Family History  (Detailed) Relationship Family Member Name Deceased Age at Death Condition Onset Age Cause of Death      Family history of Hypertension  N      Family history of Lung disease  N      Family history of Heart disease  N      Family history of Arthritis  N    SOCIAL HISTORY  (Detailed) Tobacco use reviewed. Preferred language is Unknown.   Smoking status: Never smoker.  SMOKING STATUS Use Status Type Smoking Status Usage Per Day Years Used Total Pack Years  no/never  Never smoker       HOME ENVIRONMENT/SAFETY The patient has not fallen in the last year.        MEDICATIONS(added, continued or stopped this visit): Started Medication Directions Instruction Stopped   amlodipine 5 mg-atorvastatin 10 mg tablet take 1 tablet by oral route  every day  03/04/2015   amlodipine 5 mg-benazepril 10 mg capsule take 1 capsule by oral route  every  day     Calcium 500 + D 500 mg (1,250 mg)-200 unit tablet take 1 tablet by mouth twice a day  03/04/2015   Fish oil 1400 BUCCAL take 1 tablet by mouth daily  03/04/2015   lansoprazole 30 mg capsule,delayed release take 1 capsule by oral route  every day before a meal     metoprolol succinate ER 25 mg tablet,extended release 24 hr take 1 tablet by oral route 2 times every day  03/04/2015   metoprolol tartrate 25 mg tablet take 1 tablet by oral route 2 times every day     multivitamin tablet take 1 by oral route  every day     Norco 10 mg-325 mg tablet take 1 tablet by oral route 3 times every day as needed for pain     prednisone 5 mg tablet take 1 tablet by oral route  every day     simvastatin 40 mg tablet take 1 tablet  by oral route  every day in the evening     tamsulosin 0.4 mg capsule take 1 capsule by oral route  every day     tizanidine 4 mg tablet take 1 tablet by oral route  every day at bedtime       ALLERGIES: Ingredient Reaction Medication Name Comment  NO KNOWN ALLERGIES     No known allergies.   REVIEW OF SYSTEMS System Neg/Pos Details  Constitutional Negative Chills, fatigue, fever, malaise, night sweats, weight gain and weight loss.  ENMT Negative Ear drainage, hearing loss, nasal drainage, otalgia, sinus pressure and sore throat.  Eyes Negative Eye discharge, eye pain and vision changes.  Respiratory Negative Chronic cough, cough, dyspnea, known TB exposure and wheezing.  Cardio Negative Chest pain, claudication, edema and irregular heartbeat/palpitations.  GI Negative Abdominal pain, blood in stool, change in stool pattern, constipation, decreased appetite, diarrhea, heartburn, nausea and vomiting.  GU Negative Dribbling, dysuria, erectile dysfunction, hematuria, polyuria, slow stream, urinary frequency, urinary incontinence and urinary retention.  Endocrine Negative Cold intolerance, heat intolerance, polydipsia and polyphagia.  Neuro Negative Dizziness, extremity weakness, gait disturbance, headache, memory impairment, numbness in extremity, seizures and tremors.  Psych Negative Anxiety, depression and insomnia.  Integumentary Negative Brittle hair, brittle nails, change in shape/size of mole(s), hair loss, hirsutism, hives, pruritus, rash and skin lesion.  MS Positive Back pain.  Hema/Lymph Negative Easy bleeding, easy bruising and lymphadenopathy.  Allergic/Immuno Negative Contact allergy, environmental allergies, food allergies and seasonal allergies.  Reproductive Negative Penile discharge and sexual dysfunction.     Vitals Date Temp F BP Pulse Ht In Wt Lb BMI BSA Pain Score  03/04/2015  133/62 77 74 195 25.04  5/10     PHYSICAL EXAM General Level of Distress: no acute  distress Overall Appearance: normal  Head and Face  Right Left  Fundoscopic Exam:  normal normal    Cardiovascular Cardiac: regular rate and rhythm without murmur  Right Left  Carotid Pulses: normal normal  Respiratory Lungs: clear to auscultation  Neurological Orientation: normal Recent and Remote Memory: normal Attention Span and Concentration:   normal Language: normal Fund of Knowledge: normal  Right Left Sensation: normal normal Upper Extremity Coordination: normal normal  Lower Extremity Coordination: normal normal  Musculoskeletal Gait and Station: normal  Right Left Upper Extremity Muscle Strength: normal normal Lower Extremity Muscle Strength: normal normal Upper Extremity Muscle Tone:  normal normal Lower Extremity Muscle Tone: normal normal  Motor Strength Upper and lower extremity motor strength was tested in the clinically  pertinent muscles.     Deep Tendon Reflexes  Right Left Biceps: normal normal Triceps: normal normal Brachiloradialis: normal normal Patellar: normal normal Achilles: normal normal  Sensory Sensation was tested at L1 to S1.   Cranial Nerves II. Optic Nerve/Visual Fields: normal III. Oculomotor: normal IV. Trochlear: normal V. Trigeminal: normal VI. Abducens: normal VII. Facial: normal VIII. Acoustic/Vestibular: normal IX. Glossopharyngeal: normal X. Vagus: normal XI. Spinal Accessory: normal XII. Hypoglossal: normal  Motor and other Tests Lhermittes: negative Rhomberg: negative Pronator drift: absent     Right Left Hoffman's: normal normal Clonus: normal normal Babinski: normal normal SLR: negative negative Patrick's Corky Sox): negative negative Toe Walk: normal normal Toe Lift: normal normal Heel Walk: normal normal SI Joint: nontender nontender   Additional Findings:  Patient complains of numbness and pain radiating into his legs and down both sciatic nerves with standing.  He has positive sciatic  notch discomfort with standing and his pain is worse with extension than with flexion.    IMPRESSION The patient has significant lumbar spinal stenosis.  He has a component of epidural lipomatosis as well.  I suspect this may relate to the multiple epidural injections he's had no past, none of which gave him any significant relief.  The patient is quite uncomfortable and is now not able to do many of his normal activities of daily living.  He has significant symptomatic spinal stenosis for which she is having to take Norco and gabapentin along with tizanidine.  I have recommended that he undergo decompressive lumbar laminectomy L3 through L5 levels for spinal stenosis.  Completed Orders (this encounter) Order Details Reason Side Interpretation Result Initial Treatment Date Region  Lifestyle education regarding diet Encouraged to eat a well balanced diet and follow up with primary care physician.        Lumbar Spine- AP/Lat/Flex/Ex      03/04/2015 All Levels to All Levels   Assessment/Plan # Detail Type Description   1. Assessment Low back pain, unspecified back pain laterality, with sciatica presence unspecified (M54.5).       2. Assessment Radiculopathy, lumbar region (M54.16).       3. Assessment Spinal stenosis of lumbar region (M48.06).       4. Assessment Body mass index (BMI) 25.0-25.9, adult [id number]).   Plan Orders Today's instructions / counseling include(s) Lifestyle education regarding diet.         Pain Assessment/Treatment Pain Scale: 5/10. Method: Numeric Pain Intensity Scale. Location: back/legs. Onset: 09/01/2014. Duration: varies. Quality: discomforting. Pain Assessment/Treatment follow-up plan of care: Patient is taking medications as prescribed..  Fall Risk Plan The patient has not fallen in the last year.  The patient wishes to proceed with surgery.  He is tired of hurting and is not getting any relief.  We went over the specifics of the recommended surgical  procedure as well as attendant risks and benefits and he wishes to proceed.  this will consist of decompressive lumbar laminectomy L3 through L5 for spinal stenosis.  No additional stabilization or fusion procedure is warranted given that his spine has normal alignment without evidence of spondylolisthesis.  Orders: Diagnostic Procedures: Assessment Procedure  M48.06 decompressive lumbar laminectomy L3-L5  M48.06 Lumbar Spine- AP/Lat/Flex/Ex  Instruction(s)/Education: Assessment Instruction  [phone] Lifestyle education regarding diet             Provider:  Marchia Meiers. Vertell Limber MD  03/07/2015 01:26 PM Dictation edited by: Marchia Meiers. Vertell Limber    CC Providers: Otho Darner [place], [place]  [id number]-              Electronically signed by Marchia Meiers. Vertell Limber MD on 03/07/2015 01:27 PM

## 2015-03-20 NOTE — Progress Notes (Signed)
Utilization review completed.  

## 2015-03-21 ENCOUNTER — Encounter (HOSPITAL_COMMUNITY): Payer: Self-pay | Admitting: *Deleted

## 2015-03-21 ENCOUNTER — Emergency Department (HOSPITAL_COMMUNITY): Payer: Medicare Other

## 2015-03-21 ENCOUNTER — Inpatient Hospital Stay (HOSPITAL_COMMUNITY)
Admission: EM | Admit: 2015-03-21 | Discharge: 2015-03-25 | Disposition: A | Discharge status: Home / Self Care | Payer: Medicare Other | Source: Home / Self Care | Attending: Neurosurgery | Admitting: Neurosurgery

## 2015-03-21 DIAGNOSIS — D62 Acute posthemorrhagic anemia: Secondary | ICD-10-CM | POA: Diagnosis present

## 2015-03-21 DIAGNOSIS — G44009 Cluster headache syndrome, unspecified, not intractable: Secondary | ICD-10-CM | POA: Insufficient documentation

## 2015-03-21 DIAGNOSIS — K5909 Other constipation: Secondary | ICD-10-CM | POA: Diagnosis present

## 2015-03-21 DIAGNOSIS — M542 Cervicalgia: Secondary | ICD-10-CM | POA: Insufficient documentation

## 2015-03-21 DIAGNOSIS — K219 Gastro-esophageal reflux disease without esophagitis: Secondary | ICD-10-CM | POA: Diagnosis present

## 2015-03-21 DIAGNOSIS — E785 Hyperlipidemia, unspecified: Secondary | ICD-10-CM | POA: Diagnosis present

## 2015-03-21 DIAGNOSIS — A419 Sepsis, unspecified organism: Secondary | ICD-10-CM | POA: Diagnosis present

## 2015-03-21 DIAGNOSIS — I482 Chronic atrial fibrillation, unspecified: Secondary | ICD-10-CM | POA: Diagnosis present

## 2015-03-21 DIAGNOSIS — R5082 Postprocedural fever: Secondary | ICD-10-CM | POA: Insufficient documentation

## 2015-03-21 DIAGNOSIS — I10 Essential (primary) hypertension: Secondary | ICD-10-CM | POA: Diagnosis present

## 2015-03-21 DIAGNOSIS — D649 Anemia, unspecified: Secondary | ICD-10-CM | POA: Diagnosis present

## 2015-03-21 DIAGNOSIS — M353 Polymyalgia rheumatica: Secondary | ICD-10-CM | POA: Diagnosis present

## 2015-03-21 LAB — COMPREHENSIVE METABOLIC PANEL
ALT: 12 U/L — ABNORMAL LOW (ref 17–63)
AST: 24 U/L (ref 15–41)
Albumin: 3.8 g/dL (ref 3.5–5.0)
Alkaline Phosphatase: 45 U/L (ref 38–126)
Anion gap: 9 (ref 5–15)
BUN: 16 mg/dL (ref 6–20)
CHLORIDE: 100 mmol/L — AB (ref 101–111)
CO2: 27 mmol/L (ref 22–32)
Calcium: 9.6 mg/dL (ref 8.9–10.3)
Creatinine, Ser: 1.2 mg/dL (ref 0.61–1.24)
GFR calc Af Amer: >60 mL/min (ref >60)
GFR, EST NON AFRICAN AMERICAN: 54 mL/min — AB (ref >60)
Glucose, Bld: 123 mg/dL — ABNORMAL HIGH (ref 65–99)
POTASSIUM: 4.4 mmol/L (ref 3.5–5.1)
SODIUM: 136 mmol/L (ref 135–145)
TOTAL PROTEIN: 6.4 g/dL — AB (ref 6.5–8.1)
Total Bilirubin: 0.8 mg/dL (ref 0.3–1.2)

## 2015-03-21 LAB — URINALYSIS, ROUTINE W REFLEX MICROSCOPIC
BILIRUBIN URINE: NEGATIVE
GLUCOSE, UA: NEGATIVE mg/dL
HGB URINE DIPSTICK: NEGATIVE
KETONES UR: NEGATIVE mg/dL
LEUKOCYTES UA: NEGATIVE
Nitrite: NEGATIVE
PROTEIN: NEGATIVE mg/dL
Specific Gravity, Urine: 1.013 (ref 1.005–1.030)
pH: 6.5 (ref 5.0–8.0)

## 2015-03-21 LAB — CBC WITH DIFFERENTIAL/PLATELET
BASOS ABS: 0 10*3/uL (ref 0.0–0.1)
BASOS PCT: 0 %
EOS ABS: 0 10*3/uL (ref 0.0–0.7)
EOS PCT: 0 %
HCT: 37.7 % — ABNORMAL LOW (ref 39.0–52.0)
Hemoglobin: 12.4 g/dL — ABNORMAL LOW (ref 13.0–17.0)
Lymphocytes Relative: 9 %
Lymphs Abs: 1.7 10*3/uL (ref 0.7–4.0)
MCH: 33.3 pg (ref 26.0–34.0)
MCHC: 32.9 g/dL (ref 30.0–36.0)
MCV: 101.3 fL — ABNORMAL HIGH (ref 78.0–100.0)
MONO ABS: 1.6 10*3/uL — AB (ref 0.1–1.0)
MONOS PCT: 9 %
Neutro Abs: 15.1 10*3/uL — ABNORMAL HIGH (ref 1.7–7.7)
Neutrophils Relative %: 82 %
PLATELETS: 179 10*3/uL (ref 150–400)
RBC: 3.72 MIL/uL — ABNORMAL LOW (ref 4.22–5.81)
RDW: 16.3 % — AB (ref 11.5–15.5)
WBC: 18.3 10*3/uL — ABNORMAL HIGH (ref 4.0–10.5)

## 2015-03-21 LAB — CG4 I-STAT (LACTIC ACID): Lactic Acid, Venous: 2.64 mmol/L (ref 0.5–2.0)

## 2015-03-21 LAB — I-STAT CG4 LACTIC ACID, ED: LACTIC ACID, VENOUS: 0.99 mmol/L (ref 0.5–2.0)

## 2015-03-21 MED ORDER — AMLODIPINE BESYLATE 5 MG PO TABS
5.0000 mg | ORAL_TABLET | Freq: Every day | ORAL | Status: DC
  Administered 2015-03-22 – 2015-03-25 (×4): 5 mg via ORAL
  Filled 2015-03-21 (×4): qty 1

## 2015-03-21 MED ORDER — FAMOTIDINE 20 MG PO TABS
40.0000 mg | ORAL_TABLET | Freq: Once | ORAL | Status: AC
  Administered 2015-03-21: 40 mg via ORAL
  Filled 2015-03-21: qty 2

## 2015-03-21 MED ORDER — PIPERACILLIN-TAZOBACTAM 3.375 G IVPB 30 MIN
3.3750 g | Freq: Once | INTRAVENOUS | Status: AC
  Administered 2015-03-21: 3.375 g via INTRAVENOUS
  Filled 2015-03-21: qty 50

## 2015-03-21 MED ORDER — GABAPENTIN 600 MG PO TABS
600.0000 mg | ORAL_TABLET | Freq: Two times a day (BID) | ORAL | Status: DC
  Administered 2015-03-21 – 2015-03-25 (×8): 600 mg via ORAL
  Filled 2015-03-21 (×9): qty 1

## 2015-03-21 MED ORDER — ACETAMINOPHEN 325 MG PO TABS
650.0000 mg | ORAL_TABLET | Freq: Once | ORAL | Status: AC
  Administered 2015-03-21: 650 mg via ORAL
  Filled 2015-03-21: qty 2

## 2015-03-21 MED ORDER — VANCOMYCIN HCL IN DEXTROSE 1-5 GM/200ML-% IV SOLN
1000.0000 mg | Freq: Two times a day (BID) | INTRAVENOUS | Status: DC
  Administered 2015-03-22 – 2015-03-24 (×5): 1000 mg via INTRAVENOUS
  Filled 2015-03-21 (×7): qty 200

## 2015-03-21 MED ORDER — BENAZEPRIL HCL 20 MG PO TABS
10.0000 mg | ORAL_TABLET | Freq: Every day | ORAL | Status: DC
  Administered 2015-03-22 – 2015-03-25 (×4): 10 mg via ORAL
  Filled 2015-03-21 (×4): qty 1

## 2015-03-21 MED ORDER — HYDROCODONE-ACETAMINOPHEN 10-325 MG PO TABS
1.0000 | ORAL_TABLET | Freq: Four times a day (QID) | ORAL | Status: DC | PRN
  Administered 2015-03-22: 1 via ORAL
  Filled 2015-03-21 (×2): qty 1

## 2015-03-21 MED ORDER — SODIUM CHLORIDE 0.9 % IV BOLUS (SEPSIS)
1000.0000 mL | INTRAVENOUS | Status: AC
  Administered 2015-03-21 (×3): 1000 mL via INTRAVENOUS

## 2015-03-21 MED ORDER — TIZANIDINE HCL 4 MG PO TABS
4.0000 mg | ORAL_TABLET | Freq: Every day | ORAL | Status: DC
  Administered 2015-03-21 – 2015-03-24 (×4): 4 mg via ORAL
  Filled 2015-03-21 (×6): qty 1

## 2015-03-21 MED ORDER — TAMSULOSIN HCL 0.4 MG PO CAPS
0.4000 mg | ORAL_CAPSULE | Freq: Every day | ORAL | Status: DC
  Administered 2015-03-22: 0.4 mg via ORAL
  Filled 2015-03-21: qty 1

## 2015-03-21 MED ORDER — PIPERACILLIN-TAZOBACTAM 3.375 G IVPB
3.3750 g | Freq: Three times a day (TID) | INTRAVENOUS | Status: DC
  Administered 2015-03-22 – 2015-03-25 (×10): 3.375 g via INTRAVENOUS
  Filled 2015-03-21 (×13): qty 50

## 2015-03-21 MED ORDER — SIMVASTATIN 40 MG PO TABS
40.0000 mg | ORAL_TABLET | Freq: Every day | ORAL | Status: DC
  Administered 2015-03-22: 40 mg via ORAL
  Filled 2015-03-21: qty 1

## 2015-03-21 MED ORDER — PANTOPRAZOLE SODIUM 20 MG PO TBEC
20.0000 mg | DELAYED_RELEASE_TABLET | Freq: Every day | ORAL | Status: DC
  Administered 2015-03-22 – 2015-03-25 (×4): 20 mg via ORAL
  Filled 2015-03-21 (×4): qty 1

## 2015-03-21 MED ORDER — VANCOMYCIN HCL IN DEXTROSE 1-5 GM/200ML-% IV SOLN
1000.0000 mg | Freq: Once | INTRAVENOUS | Status: AC
  Administered 2015-03-21: 1000 mg via INTRAVENOUS
  Filled 2015-03-21: qty 200

## 2015-03-21 MED ORDER — AMLODIPINE BESY-BENAZEPRIL HCL 5-10 MG PO CAPS: 1.0000 | ORAL_CAPSULE | Freq: Every day | ORAL | Status: DC

## 2015-03-21 MED ORDER — HYDROMORPHONE HCL 1 MG/ML IJ SOLN
1.0000 mg | INTRAMUSCULAR | Status: DC | PRN
  Administered 2015-03-21 – 2015-03-23 (×3): 1 mg via INTRAVENOUS
  Filled 2015-03-21 (×3): qty 1

## 2015-03-21 MED ORDER — KETOROLAC TROMETHAMINE 30 MG/ML IJ SOLN
30.0000 mg | Freq: Once | INTRAMUSCULAR | Status: AC
  Administered 2015-03-21: 30 mg via INTRAVENOUS
  Filled 2015-03-21: qty 1

## 2015-03-21 MED ORDER — POLYETHYLENE GLYCOL 3350 17 G PO PACK: 17.0000 g | PACK | Freq: Every day | ORAL | Status: DC | PRN

## 2015-03-21 MED ORDER — ONDANSETRON HCL 4 MG/2ML IJ SOLN
4.0000 mg | Freq: Four times a day (QID) | INTRAMUSCULAR | Status: DC | PRN
  Administered 2015-03-23: 4 mg via INTRAVENOUS
  Filled 2015-03-21: qty 2

## 2015-03-21 MED ORDER — METOPROLOL TARTRATE 25 MG PO TABS
25.0000 mg | ORAL_TABLET | Freq: Every day | ORAL | Status: DC
  Administered 2015-03-22 – 2015-03-25 (×4): 25 mg via ORAL
  Filled 2015-03-21 (×4): qty 1

## 2015-03-21 MED ORDER — PREDNISONE 5 MG PO TABS
5.0000 mg | ORAL_TABLET | Freq: Every day | ORAL | Status: DC
  Administered 2015-03-22 – 2015-03-25 (×4): 5 mg via ORAL
  Filled 2015-03-21 (×4): qty 1

## 2015-03-21 NOTE — Progress Notes (Signed)
Pt. discharged home accompanied by family. Prescriptions and discharge instructions given with verbalization of understanding. Incision site on back with no s/s of infection - no swelling, redness, bleeding, and/or drainage noted. Opportunity given to ask questions but no question asked. h

## 2015-03-21 NOTE — Consult Note (Signed)
ANTIBIOTIC CONSULT NOTE - INITIAL  Pharmacy Consult for Vancomycin and Zosyn Indication: rule out sepsis  Allergies  Allergen Reactions  . Codeine Other (See Comments)    Not Specified    Vital Signs: Temp: 102 F (38.9 C) (11/19 1850) Temp Source: Oral (11/19 1850) BP: 107/51 mmHg (11/19 1820) Pulse Rate: 93 (11/19 1820) Intake/Output from previous day:   Intake/Output from this shift:    Labs: No results for input(s): WBC, HGB, PLT, LABCREA, CREATININE in the last 72 hours. Estimated Creatinine Clearance: 61.4 mL/min (by C-G formula based on Cr of 1.06).   Microbiology: Recent Results (from the past 720 hour(s))  Surgical pcr screen     Status: None   Collection Time: 03/17/15  3:03 PM  Result Value Ref Range Status   MRSA, PCR NEGATIVE NEGATIVE Final   Staphylococcus aureus NEGATIVE NEGATIVE Final    Comment:        The Xpert SA Assay (FDA approved for NASAL specimens in patients over 23 years of age), is one component of a comprehensive surveillance program.  Test performance has been validated by [facility] for patients greater than or equal to 42 year old. It is not intended to diagnose infection nor to guide or monitor treatment.     Medical History: Past Medical History  Diagnosis Date  . Hypertension   . Osteopenia   . Arthritis   . Hyperlipemia   . GERD (gastroesophageal reflux disease)   . DDD (degenerative disc disease), cervical   . Polymyalgia rheumatica ([place])   . Constipation due to opioid therapy   . Nasal congestion   . Anemia   . Carpal tunnel syndrome   . Neuropathy ([place])   . Dysrhythmia   . Cancer ([place])     Skin Cancer-   . History of hiatal hernia   . BPH (benign prostatic hyperplasia)   . Complication of anesthesia 2011    " bleeding from Esophagus after extubated, had to have an endo   Assessment: 81yom s/p spinal surgery yesterday and discharged this morning returns to the ED with fever and severe pain. He will begin  vancomycin and zosyn for possible sepsis. Labs for this admit pending but were wnl from 11/15. First doses vancomycin 1g and zosyn 3.375g ordered to be given in ED.  Goal of Therapy:  Vancomycin trough level 15-20 mcg/ml  Plan:  1) Vancomycin 1g IV q12 2) Zosyn 3.375g IV q8 (4 hour infusion) 3) Follow renal function, cultures, LOT, level if needed  Kevin Carlson 03/21/2015,6:53 PM

## 2015-03-21 NOTE — H&P (Signed)
Kevin Carlson. is an 81 y.o. male.   Chief Complaint: fever HPI: patient who had lumbar decompression yesterday and went home this morning but in the early afternoon he started to develop chills with increase of temperature. Family was advsed to bring [facility] to [facility].   Past Medical History  Diagnosis Date  . Hypertension   . Osteopenia   . Arthritis   . Hyperlipemia   . GERD (gastroesophageal reflux disease)   . DDD (degenerative disc disease), cervical   . Polymyalgia rheumatica ([place])   . Constipation due to opioid therapy   . Nasal congestion   . Anemia   . Carpal tunnel syndrome   . Neuropathy ([place])   . Dysrhythmia   . Cancer ([place])     Skin Cancer-   . History of hiatal hernia   . BPH (benign prostatic hyperplasia)   . Complication of anesthesia 2011    " bleeding from Esophagus after extubated, had to have an endo    Past Surgical History  Procedure Laterality Date  . Hernia repair Right   . Colonoscopy w/ polypectomy    . Replacement total knee Left 2011  . Replacement total knee Right 2006  . Knee surgery Right 1974    "cleaned it out"  . Eye surgery Bilateral     cataract  . Finger surgery Right     index- pinned  . Appendectomy  1951  . Colon resection  1951    after appendectomy  . Tonsillectomy    . Hernia repair Right 1948    History reviewed. No pertinent family history. Social History:  reports that he quit smoking about 40 years ago. He does not have any smokeless tobacco history on file. He reports that he does not drink alcohol or use illicit drugs.  Allergies:  Allergies  Allergen Reactions  . Codeine Other (See Comments)    Not Specified     (Not in a hospital admission)  Results for orders placed or performed during the hospital encounter of 03/21/15 (from the past 48 hour(s))  Comprehensive metabolic panel     Status: Abnormal   Collection Time: 03/21/15  6:44 PM  Result Value Ref Range   Sodium 136 135 - 145 mmol/L   Potassium 4.4  3.5 - 5.1 mmol/L   Chloride 100 (L) 101 - 111 mmol/L   CO2 27 22 - 32 mmol/L   Glucose, Bld 123 (H) 65 - 99 mg/dL   BUN 16 6 - 20 mg/dL   Creatinine, Ser 1.20 0.61 - 1.24 mg/dL   Calcium 9.6 8.9 - 10.3 mg/dL   Total Protein 6.4 (L) 6.5 - 8.1 g/dL   Albumin 3.8 3.5 - 5.0 g/dL   AST 24 15 - 41 U/L   ALT 12 (L) 17 - 63 U/L   Alkaline Phosphatase 45 38 - 126 U/L   Total Bilirubin 0.8 0.3 - 1.2 mg/dL   GFR calc non Af Amer 54 (L) >60 mL/min   GFR calc Af Amer >60 >60 mL/min    Comment: (NOTE) The eGFR has been calculated using the CKD EPI equation. This calculation has not been validated in all clinical situations. eGFR's persistently <60 mL/min signify possible Chronic Kidney Disease.    Anion gap 9 5 - 15  CBC with Differential     Status: Abnormal   Collection Time: 03/21/15  6:44 PM  Result Value Ref Range   WBC 18.3 (H) 4.0 - 10.5 K/uL   RBC 3.72 (L)  4.22 - 5.81 MIL/uL   Hemoglobin 12.4 (L) 13.0 - 17.0 g/dL   HCT 37.7 (L) 39.0 - 52.0 %   MCV 101.3 (H) 78.0 - 100.0 fL   MCH 33.3 26.0 - 34.0 pg   MCHC 32.9 30.0 - 36.0 g/dL   RDW 16.3 (H) 11.5 - 15.5 %   Platelets 179 150 - 400 K/uL   Neutrophils Relative % 82 %   Neutro Abs 15.1 (H) 1.7 - 7.7 K/uL   Lymphocytes Relative 9 %   Lymphs Abs 1.7 0.7 - 4.0 K/uL   Monocytes Relative 9 %   Monocytes Absolute 1.6 (H) 0.1 - 1.0 K/uL   Eosinophils Relative 0 %   Eosinophils Absolute 0.0 0.0 - 0.7 K/uL   Basophils Relative 0 %   Basophils Absolute 0.0 0.0 - 0.1 K/uL  I-Stat CG4 Lactic Acid, ED  (not at [facility])     Status: None   Collection Time: 03/21/15  6:58 PM  Result Value Ref Range   Lactic Acid, Venous 0.99 0.5 - 2.0 mmol/L   Dg Lumbar Spine 1 View  03/20/2015  CLINICAL DATA:  L3-5 decompression and laminectomy EXAM: LUMBAR SPINE - 1 VIEW COMPARISON:  None. FINDINGS: Single lateral radiograph demonstrates a surgical probe at L4-5. IMPRESSION: Lumbar localization as above. Electronically Signed   By: Julian Hy M.D.    On: 03/20/2015 12:32    Review of Systems  Constitutional: Positive for fever, chills, malaise/fatigue and diaphoresis.  HENT: Negative.   Eyes: Negative.   Respiratory: Negative.   Cardiovascular: Negative.   Gastrointestinal: Negative.   Genitourinary: Negative.   Musculoskeletal: Positive for back pain.  Skin: Positive for itching.  Neurological: Positive for weakness.  Endo/Heme/Allergies: Negative.   Psychiatric/Behavioral: Negative.     Blood pressure 126/83, pulse 96, temperature 102 F (38.9 C), temperature source Oral, resp. rate 14, SpO2 99 %. Physical Exam shaking all over. C/o generalized weakness. Hent, nl. Neck, no stiffness. Cv, nl. Lungs mild ronchiis, abdomen, soft. Extremities, nl. Wound dry, no redness, no drainage. Neuron , nl . Cbc ,increase of wbc. Chest ray pending, blood cultures taken  Assessment/Plan W/u for post op fever. Will get the hospitalists involved  [place] 03/21/2015, 8:09 PM

## 2015-03-21 NOTE — Evaluation (Signed)
Occupational Therapy Evaluation and Discharge Patient Details Name: Kevin Carlson. MRN: [id number] DOB: [birth year] Today's Date: 03/21/2015    History of Present Illness s/p lumbar decompression with laminectomy L3-S1   Clinical Impression   Pt educated in back precautions related to mobility, ADL and IADL with handout provided and daughter and significant other also present.  Pt verbalizing understanding of all.  No further OT needs.      Follow Up Recommendations  No OT follow up    Equipment Recommendations  None recommended by OT    Recommendations for Other Services       Precautions / Restrictions Precautions Precautions: Back Precaution Booklet Issued: Yes (comment) Precaution Comments: reviewed back precautions Restrictions Weight Bearing Restrictions: No      Mobility Bed Mobility Overal bed mobility: Needs Assistance Bed Mobility: Rolling;Sidelying to Sit;Sit to Sidelying Rolling: Supervision Sidelying to sit: Supervision     Sit to sidelying: Supervision General bed mobility comments: verbal cues for log roll technique  Transfers Overall transfer level: Modified independent Equipment used: None                  Balance                                            ADL Overall ADL's : Modified independent                                       General ADL Comments: Pt able to cross foot over opposite knee to donn socks. Educated pt in back precautions related to ADL and IADL. Daughter does pt's laundry and housekeeping currently.     Vision     Perception     Praxis      Pertinent Vitals/Pain Pain Assessment: Faces Faces Pain Scale: Hurts a little bit Pain Location: incision Pain Descriptors / Indicators: Grimacing Pain Intervention(s): Monitored during session     Hand Dominance Right   Extremity/Trunk Assessment Upper Extremity Assessment Upper Extremity Assessment: Overall WFL for  tasks assessed   Lower Extremity Assessment Lower Extremity Assessment: Defer to PT evaluation       Communication Communication Communication: No difficulties   Cognition Arousal/Alertness: Awake/alert Behavior During Therapy: WFL for tasks assessed/performed Overall Cognitive Status: Within Functional Limits for tasks assessed                     General Comments       Exercises       Shoulder Instructions      Home Living Family/patient expects to be discharged to:: Private residence Living Arrangements: Alone Available Help at Discharge: Family;Available 24 hours/day Type of Home: House Home Access: Level entry     Home Layout: One level (shower in basement)     Bathroom Shower/Tub: Occupational psychologist: Handicapped height     Home Equipment: Financial controller: [employer]        Prior Functioning/Environment Level of Independence: Independent             OT Diagnosis: Generalized weakness;Acute pain   OT Problem List:     OT Treatment/Interventions:      OT Goals(Current goals can be found in the care plan section) Acute Rehab OT Goals Patient Stated Goal: Go  home.  OT Frequency:     Barriers to D/C:            Co-evaluation              End of Session    Activity Tolerance: Patient tolerated treatment well Patient left:  (in hall with PT)   [id number] OT Time Calculation (min): 17 min Charges:  OT General Charges $OT Visit: 1 Procedure OT Evaluation $Initial OT Evaluation Tier I: 1 Procedure G-Codes:    Malka So 03/21/2015, 9:05 AM  [phone]

## 2015-03-21 NOTE — Progress Notes (Signed)
Physical Therapy Evaluation & Discharge Patient Details Name: Kevin Carlson. MRN: [id number] DOB: 07-Jun-1929 Today's Date: 03/21/2015   History of Present Illness  s/p lumbar decompression with laminectomy L3-S1  Clinical Impression  81 yo male presents with post surgical pain, no radicular pain any longer, and Supervision to Independent for all mobility including stairs.  Patient educated to be conservative with activity initially, use good technique for bed mobility, and follow up with MD on release interval for spinal precautions.  Warned that pain may be elevated on post op day 2-3, is expected if not too severe.  Patient cleared from PT services, ok to return home with daughter and girlfriend to assist.     Follow Up Recommendations No PT follow up    Equipment Recommendations  None recommended by PT    Recommendations for Other Services       Precautions / Restrictions Precautions Precautions: Back Precaution Booklet Issued: Yes (comment) Precaution Comments: reviewed back precautions Restrictions Weight Bearing Restrictions: No      Mobility  Bed Mobility Overal bed mobility: Needs Assistance Bed Mobility: Rolling;Sidelying to Sit;Sit to Sidelying Rolling: Supervision Sidelying to sit: Supervision     Sit to sidelying: Supervision General bed mobility comments: verbal cues for log roll technique  Transfers Overall transfer level: Modified independent Equipment used: None                Ambulation/Gait Ambulation/Gait assistance: Modified independent (Device/Increase time) Ambulation Distance (Feet): 150 Feet Assistive device: None Gait Pattern/deviations: Step-through pattern;Decreased stride length   Gait velocity interpretation: Below normal speed for age/gender (due to acute pain.)    Stairs Stairs: Yes Stairs assistance: Supervision Stair Management: One rail Right Number of Stairs: 16    Wheelchair Mobility    Modified Rankin  (Stroke Patients Only)       Balance Overall balance assessment: Independent                                           Pertinent Vitals/Pain Pain Assessment: 0-10 Pain Score: 6  Faces Pain Scale: Hurts a little bit Pain Location: back incision Pain Descriptors / Indicators: Sore Pain Intervention(s): Monitored during session    Home Living Family/patient expects to be discharged to:: Private residence Living Arrangements: Alone Available Help at Discharge: Family;Available 24 hours/day Type of Home: House Home Access: Level entry     Home Layout: One level (shower in basement) Home Equipment: Adaptive equipment      Prior Function Level of Independence: Independent               Hand Dominance   Dominant Hand: Right    Extremity/Trunk Assessment   Upper Extremity Assessment: Defer to OT evaluation;Overall WFL for tasks assessed           Lower Extremity Assessment: Overall WFL for tasks assessed         Communication   Communication: No difficulties  Cognition Arousal/Alertness: Awake/alert Behavior During Therapy: WFL for tasks assessed/performed Overall Cognitive Status: Within Functional Limits for tasks assessed                      General Comments      Exercises        Assessment/Plan    PT Assessment Patent does not need any further PT services  PT Diagnosis Acute pain   PT Problem  List    PT Treatment Interventions     PT Goals (Current goals can be found in the Care Plan section) Acute Rehab PT Goals Patient Stated Goal: Go home. PT Goal Formulation: All assessment and education complete, DC therapy    Frequency     Barriers to discharge        Co-evaluation               End of Session Equipment Utilized During Treatment: Gait belt Activity Tolerance: Patient tolerated treatment well;No increased pain Patient left: in bed;with call bell/phone within reach;with family/visitor  present Nurse Communication: Mobility status         Time: 0900-0910 PT Time Calculation (min) (ACUTE ONLY): 10 min   Charges:   PT Evaluation $Initial PT Evaluation Tier I: 1 Procedure     PT G CodesZenia Resides, Jaylaa Gallion L Mar 22, 2015, 9:34 AM

## 2015-03-21 NOTE — Consult Note (Signed)
[facility] Medical Consultation  Cleophas Verderame. [id number] DOB: 11-24-1929 DOA: 03/21/2015 PCP: Kennieth Rad, MD   Requesting physician: Tomma Rakers, M.D. Date of consultation: 03/21/2015 Reason for consultation: Fever.  Impression/Recommendations Principal Problem:   Sepsis due to undetermined organism [facility]) Admit to the stepdown for closer monitoring. No obvious source of infection at this time. Continue IV vancomycin and Zosyn. Continue IV fluids. Follow-up WBC.  Active Problems:   Hypertension Continue amlodipine, benazepril and metoprolol. Monitor blood pressure.    Hyperlipidemia Continue simvastatin 40 mg by mouth daily. Monitor LFTs periodically.    Atrial fibrillation (HCC) Continue metoprolol for rate control. Check echocardiogram.    GERD (gastroesophageal reflux disease) Continue proton pump inhibitor. I will order Protonix 40 mg by mouth daily    Polymyalgia rheumatica (HCC) Continue prednisone 5 mg by mouth daily.    Anemia Monitor hematocrit and hemoglobin.    Chronic constipation Continue MiraLAX as needed. Consider other agents to treat constipation if necessary.   I will followup again tomorrow. Please contact me if I can be of assistance in the meanwhile. Thank you for this consultation.  Chief Complaint: Fever.  HPI: 81 year old male with a past medical history hypertension, osteopenia, arthritis, hyperlipidemia, GERD, polymyalgia rheumatica, chronic constipation secondary to opiates, anemia, spinal stenosis who underwent lumbar decompression yesterday and was discharged home this morning, but returned to the emergency department due to fever of more than 10 41F and confusion as per family members. He denies headache, pleuritic chest pain, cough, nausea, emesis, diarrhea, dysuria or hematuria. His surgical wound looks clear.  Review of Systems:  General: Positive fever, chills, rigors and fatigue. Eyes: Denies blurred  vision. ENT: Had a sore throat yesterday, but denies further symptoms today. Cardiovascular: No chest pain, palpitations, dizziness, diaphoresis, dyspnea, PND or orthopnea. Respiratory: No dyspnea, no cough, no wheezing. Gastrointestinal: Positive history of constipation, positive decreased appetite, denies abdominal pain, nausea, emesis, diarrhea, melena or hematochezia. Genitourinary: Denies dysuria, frequency or hematuria. Endocrine: No polyuria, no polydipsia. Musculoskeletal: Positive back pain. Skin: Denies itching or skin rashes. Psych: Per daughter, the patient was confused and delirious earlier when he was febrile. Neurological: He denies sensorial or motor deficits. No fecal or urinary incontinence.  Past Medical History  Diagnosis Date  . Hypertension   . Osteopenia   . Arthritis   . Hyperlipemia   . GERD (gastroesophageal reflux disease)   . DDD (degenerative disc disease), cervical   . Polymyalgia rheumatica ([place])   . Constipation due to opioid therapy   . Nasal congestion   . Anemia   . Carpal tunnel syndrome   . Neuropathy ([place])   . Dysrhythmia   . Cancer ([place])     Skin Cancer-   . History of hiatal hernia   . BPH (benign prostatic hyperplasia)   . Complication of anesthesia 2011    " bleeding from Esophagus after extubated, had to have an endo   Past Surgical History  Procedure Laterality Date  . Hernia repair Right   . Colonoscopy w/ polypectomy    . Replacement total knee Left 2011  . Replacement total knee Right 2006  . Knee surgery Right 1974    "cleaned it out"  . Eye surgery Bilateral     cataract  . Finger surgery Right     index- pinned  . Appendectomy  1951  . Colon resection  1951    after appendectomy  . Tonsillectomy    . Hernia repair Right 1948   Social  History:  reports that he quit smoking about 40 years ago. He does not have any smokeless tobacco history on file. He reports that he does not drink alcohol or use illicit  drugs.  Allergies  Allergen Reactions  . Codeine Other (See Comments)    Not Specified   History reviewed. No pertinent family history.  Prior to Admission medications   Medication Sig Start Date End Date Taking? Authorizing Provider  amLODipine-benazepril (LOTREL) 5-10 MG capsule Take 1 capsule by mouth daily.    Historical Provider, MD  gabapentin (NEURONTIN) 600 MG tablet Take 600 mg by mouth 2 (two) times daily.    Historical Provider, MD  HYDROcodone-acetaminophen (NORCO) 10-325 MG tablet Take 1 tablet by mouth every 6 (six) hours as needed for moderate pain.    Historical Provider, MD  lansoprazole (PREVACID) 30 MG capsule Take 30 mg by mouth daily at 12 noon.    Historical Provider, MD  metoprolol tartrate (LOPRESSOR) 25 MG tablet Take 25 mg by mouth daily.     Historical Provider, MD  polyethylene glycol (MIRALAX / GLYCOLAX) packet Take 17 g by mouth daily as needed.     Historical Provider, MD  predniSONE (DELTASONE) 5 MG tablet Take 5 mg by mouth daily with breakfast.    Historical Provider, MD  simvastatin (ZOCOR) 40 MG tablet Take 40 mg by mouth daily.    Historical Provider, MD  tamsulosin (FLOMAX) 0.4 MG CAPS capsule Take 0.4 mg by mouth.    Historical Provider, MD  tiZANidine (ZANAFLEX) 4 MG capsule Take 4 mg by mouth at bedtime.    Historical Provider, MD   Physical Exam: Blood pressure 120/65, pulse 98, temperature 100.4 F (38 C), temperature source Oral, resp. rate 17, SpO2 94 %. Filed Vitals:   03/21/15 2128  BP:   Pulse:   Temp: 100.4 F (38 C)  Resp:      General:  Febrile, but in no acute distress.  Eyes: PERRLA, EOMI  ENT: Lips and oral mucosa were dry, throat was clear.  Neck: Supple, no JVD, no thyromegaly.  Cardiovascular: S1-S2, irregularly irregular  Respiratory: CTA bilaterally.  Abdomen: Bowel sounds positive soft nontender.  Skin: No rashes on gross exam.  Musculoskeletal: Normal muscle tone.  Psychiatric: Mood and affect are  normal.  Neurologic: Awake alert oriented 3, no deficits on gross neurological exam  Labs on Admission:  Basic Metabolic Panel:  Recent Labs Lab 03/17/15 1503 03/21/15 1844  NA 140 136  K 4.4 4.4  CL 107 100*  CO2 27 27  GLUCOSE 95 123*  BUN 14 16  CREATININE 1.06 1.20  CALCIUM 9.4 9.6   Liver Function Tests:  Recent Labs Lab 03/21/15 1844  AST 24  ALT 12*  ALKPHOS 45  BILITOT 0.8  PROT 6.4*  ALBUMIN 3.8   CBC:  Recent Labs Lab 03/17/15 1503 03/21/15 1844  WBC 10.9* 18.3*  NEUTROABS  --  15.1*  HGB 13.0 12.4*  HCT 39.5 37.7*  MCV 101.8* 101.3*  PLT 198 179    Radiological Exams on Admission: Dg Chest 2 View  03/21/2015  CLINICAL DATA:  Fever. Hx HTN. EXAM: CHEST  2 VIEW COMPARISON:  None. FINDINGS: Lateral view degraded by patient arm position. Midline trachea. Mild cardiomegaly with transverse aortic atherosclerosis. No pleural effusion or pneumothorax. Clear lungs. IMPRESSION: Mild cardiomegaly and atherosclerosis.  No acute findings. Electronically Signed   By: Abigail Miyamoto M.D.   On: 03/21/2015 20:36   Dg Lumbar Spine 1 View  03/20/2015  CLINICAL DATA:  L3-5 decompression and laminectomy EXAM: LUMBAR SPINE - 1 VIEW COMPARISON:  None. FINDINGS: Single lateral radiograph demonstrates a surgical probe at L4-5. IMPRESSION: Lumbar localization as above. Electronically Signed   By: Julian Hy M.D.   On: 03/20/2015 12:32    EKG: Independently reviewed. Vent. rate 71 BPM PR interval * ms QRS duration 80 ms QT/QTc 362/393 ms P-R-T axes * 69 74 Atrial fibrillation Abnormal ECG No old tracing to compare   Time spent: Over 70 minutes were spent during the process of this consultation.  Reubin Milan [facility] Pager [phone].  If 7PM-7AM, please contact night-coverage [email or web address] Password [facility] 03/21/2015, 9:37 PM

## 2015-03-21 NOTE — ED Provider Notes (Signed)
CSN: [id number]     Arrival date & time 03/21/15  1805 History   First MD Initiated Contact with Patient 03/21/15 1834     Chief Complaint  Patient presents with  . Post-op Problem  . Fever   Patient is a 81 y.o. male presenting with fever. The history is provided by the patient and a relative.  Fever Max temp prior to arrival:  102 Temp source:  Oral Onset quality:  Sudden Duration:  1 day Timing:  Constant Progression:  Unchanged Chronicity:  New Relieved by:  Nothing Associated symptoms: no chest pain, no chills, no confusion, no cough, no diarrhea, no dysuria, no headaches, no nausea, no rash, no rhinorrhea, no sore throat and no vomiting   Risk factors: recent surgery     Past Medical History  Diagnosis Date  . Hypertension   . Osteopenia   . Arthritis   . Hyperlipemia   . GERD (gastroesophageal reflux disease)   . DDD (degenerative disc disease), cervical   . Polymyalgia rheumatica ([place])   . Constipation due to opioid therapy   . Nasal congestion   . Anemia   . Carpal tunnel syndrome   . Neuropathy ([place])   . Dysrhythmia   . Cancer ([place])     Skin Cancer-   . History of hiatal hernia   . BPH (benign prostatic hyperplasia)   . Complication of anesthesia 2011    " bleeding from Esophagus after extubated, had to have an endo   Past Surgical History  Procedure Laterality Date  . Hernia repair Right   . Colonoscopy w/ polypectomy    . Replacement total knee Left 2011  . Replacement total knee Right 2006  . Knee surgery Right 1974    "cleaned it out"  . Eye surgery Bilateral     cataract  . Finger surgery Right     index- pinned  . Appendectomy  1951  . Colon resection  1951    after appendectomy  . Tonsillectomy    . Hernia repair Right 1948   History reviewed. No pertinent family history. Social History  Substance Use Topics  . Smoking status: Former Smoker -- 25 years    Quit date: 05/02/1974  . Smokeless tobacco: None     Comment: quit smoking in  1976  . Alcohol Use: No    Review of Systems  Constitutional: Positive for fever. Negative for chills.  HENT: Negative for rhinorrhea and sore throat.   Eyes: Negative for visual disturbance.  Respiratory: Negative for cough and shortness of breath.   Cardiovascular: Negative for chest pain.  Gastrointestinal: Negative for nausea, vomiting, abdominal pain, diarrhea and constipation.  Genitourinary: Negative for dysuria, hematuria and difficulty urinating.  Musculoskeletal: Positive for back pain. Negative for neck pain.  Skin: Negative for rash.  Neurological: Negative for syncope, weakness and headaches.  Psychiatric/Behavioral: Negative for confusion.  All other systems reviewed and are negative.  Allergies  Codeine  Home Medications   Prior to Admission medications   Medication Sig Start Date End Date Taking? Authorizing Provider  amLODipine-benazepril (LOTREL) 5-10 MG capsule Take 1 capsule by mouth daily.    Historical Provider, MD  gabapentin (NEURONTIN) 600 MG tablet Take 600 mg by mouth 2 (two) times daily.    Historical Provider, MD  HYDROcodone-acetaminophen (NORCO) 10-325 MG tablet Take 1 tablet by mouth every 6 (six) hours as needed for moderate pain.    Historical Provider, MD  lansoprazole (PREVACID) 30 MG capsule Take 30 mg by  mouth daily at 12 noon.    Historical Provider, MD  metoprolol tartrate (LOPRESSOR) 25 MG tablet Take 25 mg by mouth daily.     Historical Provider, MD  polyethylene glycol (MIRALAX / GLYCOLAX) packet Take 17 g by mouth daily as needed.     Historical Provider, MD  predniSONE (DELTASONE) 5 MG tablet Take 5 mg by mouth daily with breakfast.    Historical Provider, MD  simvastatin (ZOCOR) 40 MG tablet Take 40 mg by mouth daily.    Historical Provider, MD  tamsulosin (FLOMAX) 0.4 MG CAPS capsule Take 0.4 mg by mouth.    Historical Provider, MD  tiZANidine (ZANAFLEX) 4 MG capsule Take 4 mg by mouth at bedtime.    Historical Provider, MD   BP  107/51 mmHg  Pulse 93  Temp(Src) 100.7 F (38.2 C) (Oral)  Resp 16  SpO2 97% Physical Exam  Constitutional: He is oriented to person, place, and time. He appears well-developed and well-nourished. No distress.  HENT:  Head: Normocephalic and atraumatic.  Mouth/Throat: Oropharynx is clear and moist.  Eyes: EOM are normal.  Neck: Neck supple. No JVD present.  Cardiovascular: Normal rate, regular rhythm, normal heart sounds and intact distal pulses.   Pulmonary/Chest: Effort normal and breath sounds normal.  Abdominal: Soft. He exhibits no distension. There is no tenderness.  Musculoskeletal: Normal range of motion. He exhibits no edema.  Midline lumbar surgical site healing well, no erythema or drainage  Neurological: He is alert and oriented to person, place, and time. He has normal strength. No cranial nerve deficit or sensory deficit. GCS eye subscore is 4. GCS verbal subscore is 5. GCS motor subscore is 6.  Reflex Scores:      Bicep reflexes are 2+ on the right side and 2+ on the left side.      Brachioradialis reflexes are 2+ on the right side and 2+ on the left side.      Patellar reflexes are 2+ on the right side and 2+ on the left side. Gait deferred  Skin: Skin is warm and dry.  Psychiatric: His behavior is normal.    ED Course  Procedures none   Labs Review Labs Reviewed  COMPREHENSIVE METABOLIC PANEL - Abnormal; Notable for the following:    Chloride 100 (*)    Glucose, Bld 123 (*)    Total Protein 6.4 (*)    ALT 12 (*)    GFR calc non Af Amer 54 (*)    All other components within normal limits  CBC WITH DIFFERENTIAL/PLATELET - Abnormal; Notable for the following:    WBC 18.3 (*)    RBC 3.72 (*)    Hemoglobin 12.4 (*)    HCT 37.7 (*)    MCV 101.3 (*)    RDW 16.3 (*)    Neutro Abs 15.1 (*)    Monocytes Absolute 1.6 (*)    All other components within normal limits  URINE CULTURE  CULTURE, BLOOD (ROUTINE X 2)  CULTURE, BLOOD (ROUTINE X 2)  URINALYSIS,  ROUTINE W REFLEX MICROSCOPIC (NOT AT [facility])  MAGNESIUM  PHOSPHORUS  I-STAT CG4 LACTIC ACID, ED  I-STAT CG4 LACTIC ACID, ED    Imaging Review Dg Chest 2 View  03/21/2015  CLINICAL DATA:  Fever. Hx HTN. EXAM: CHEST  2 VIEW COMPARISON:  None. FINDINGS: Lateral view degraded by patient arm position. Midline trachea. Mild cardiomegaly with transverse aortic atherosclerosis. No pleural effusion or pneumothorax. Clear lungs. IMPRESSION: Mild cardiomegaly and atherosclerosis.  No acute findings. Electronically  Signed   By: Abigail Miyamoto M.D.   On: 03/21/2015 20:36   Dg Lumbar Spine 1 View  03/20/2015  CLINICAL DATA:  L3-5 decompression and laminectomy EXAM: LUMBAR SPINE - 1 VIEW COMPARISON:  None. FINDINGS: Single lateral radiograph demonstrates a surgical probe at L4-5. IMPRESSION: Lumbar localization as above. Electronically Signed   By: Julian Hy M.D.   On: 03/20/2015 12:32   I have personally reviewed and evaluated these images and lab results as part of my medical decision-making.  MDM   Final diagnoses:  Post op fever    Patient is an 81 year old male with history of degenerative disc disease and lumbar radiculopathy status post L3-L5 laminectomy who presents with fever of 102 at home today. Patient denies urinary symptoms, lower extremity numbness or weakness. He denies cough or shortness of breath. Patient's daughter gave him one of his hydrocodone for the fever. Patient's temp is 100.7 here with a heart rate of 93 blood pressure 107/51. Surgical site appears well healing without surrounding erythema or purulence. Doubt that this is a surgical site infection. We'll obtain blood cultures, labs and provide fluids and obtain chest x-ray. WBC 18. CXR without focal consolidation to suggest PNA. UA negative. I spoke with neurosurgery about the case. Dr. Joya Salm evaluated the patient and we will admit to the hospitalist for post-op fever. Stable for the floor. vanc and zosyn given for empiric  treatment.  Dicussed with Dr. Reather Converse.    Gustavus Bryant, MD 03/22/15 [id number]  Elnora Morrison, MD 03/22/15 2202

## 2015-03-21 NOTE — ED Notes (Signed)
Pt reports having lumbar surgery yesterday. Began having fever and severe pain last night.

## 2015-03-21 NOTE — ED Notes (Signed)
MD in room

## 2015-03-21 NOTE — Progress Notes (Signed)
Pt arrived to [facility] at 2220.  Vital signs are all WNL, pt is AOx4, complains of back pain but pain has improved with dilaudid given in the ED.  Pt is afebrile.  Family is at the bedside, pt is resting comfortably.. RN will continue to monitor.

## 2015-03-21 NOTE — Progress Notes (Signed)
Patient ID: Kevin Holms., male   DOB: [birth year], 81 y.o.   MRN: [id number] Chest ray negative. i will call the hospitalist service for help

## 2015-03-21 NOTE — Discharge Summary (Signed)
Physician Discharge Summary  Patient ID: Kevin Carlson. MRN: [id number] DOB/AGE: [birth year] 81 y.o.  Admit date: 03/20/2015 Discharge date: 03/21/2015  Admission Diagnoses:Lumbar spinal stenosis with radiculopathy  Discharge Diagnoses: Same Active Problems:   Lumbar spinal stenosis   Discharged Condition: good  Hospital Course: Patient underwent decompressive lumbar laminectomy L 3 - S 1 levels, from which he did well.   Consults: None  Significant Diagnostic Studies: None  Treatments: surgery:decompressive lumbar laminectomy L 3 - S 1 levels  Discharge Exam: Blood pressure 119/68, pulse 75, temperature 98.5 F (36.9 C), temperature source Oral, resp. rate 18, height 6\' 2"  (1.88 m), weight 88.587 kg (195 lb 4.8 oz), SpO2 99 %. Neurologic: Alert and oriented X 3, normal strength and tone. Normal symmetric reflexes. Normal coordination and gait Wound:CDI  Disposition: Home     Medication List    TAKE these medications        amLODipine-benazepril 5-10 MG capsule  Commonly known as:  LOTREL  Take 1 capsule by mouth daily.     gabapentin 600 MG tablet  Commonly known as:  NEURONTIN  Take 600 mg by mouth 2 (two) times daily.     HYDROcodone-acetaminophen 10-325 MG tablet  Commonly known as:  NORCO  Take 1 tablet by mouth every 6 (six) hours as needed for moderate pain.     lansoprazole 30 MG capsule  Commonly known as:  PREVACID  Take 30 mg by mouth daily at 12 noon.     metoprolol tartrate 25 MG tablet  Commonly known as:  LOPRESSOR  Take 25 mg by mouth daily.     polyethylene glycol packet  Commonly known as:  MIRALAX / GLYCOLAX  Take 17 g by mouth daily as needed.     predniSONE 5 MG tablet  Commonly known as:  DELTASONE  Take 5 mg by mouth daily with breakfast.     simvastatin 40 MG tablet  Commonly known as:  ZOCOR  Take 40 mg by mouth daily.     tamsulosin 0.4 MG Caps capsule  Commonly known as:  FLOMAX  Take 0.4 mg by mouth.     tiZANidine 4 MG capsule  Commonly known as:  ZANAFLEX  Take 4 mg by mouth at bedtime.           Follow-up Information    Follow up with [name], MD.   Specialty:  Neurosurgery   Contact information:   [address] [place] [address] [phone]       Signed: [name], MD 03/21/2015, 6:35 AM

## 2015-03-21 NOTE — Progress Notes (Signed)
Subjective: Patient reports feels great  Objective: Vital signs in last 24 hours: Temp:  [97.5 F (36.4 C)-98.7 F (37.1 C)] 98.5 F (36.9 C) (11/19 0400) Pulse Rate:  [66-95] 75 (11/19 0400) Resp:  [11-20] 18 (11/19 0400) BP: (102-156)/(54-99) 119/68 mmHg (11/19 0400) SpO2:  [73 %-100 %] 99 % (11/19 0400) Weight:  [88.587 kg (195 lb 4.8 oz)] 88.587 kg (195 lb 4.8 oz) (11/18 [id number])  Intake/Output from previous day: 11/18 0701 - 11/19 0700 In: [id number] [P.O.:240; I.V.:1025] Out: 700 [Urine:600; Blood:100] Intake/Output this shift: Total I/O In: -  Out: 600 [Urine:600]  Physical Exam: Strength full.  Dressing CDI.  Walking without pain or numbness  Lab Results: No results for input(s): WBC, HGB, HCT, PLT in the last 72 hours. BMET No results for input(s): NA, K, CL, CO2, GLUCOSE, BUN, CREATININE, CALCIUM in the last 72 hours.  Studies/Results: Dg Lumbar Spine 1 View  03/20/2015  CLINICAL DATA:  L3-5 decompression and laminectomy EXAM: LUMBAR SPINE - 1 VIEW COMPARISON:  None. FINDINGS: Single lateral radiograph demonstrates a surgical probe at L4-5. IMPRESSION: Lumbar localization as above. Electronically Signed   By: Julian Hy M.D.   On: 03/20/2015 12:32    Assessment/Plan: D/C home.  Doing well.    LOS: 1 day    [name], MD 03/21/2015, 6:34 AM

## 2015-03-22 DIAGNOSIS — E785 Hyperlipidemia, unspecified: Secondary | ICD-10-CM

## 2015-03-22 DIAGNOSIS — K219 Gastro-esophageal reflux disease without esophagitis: Secondary | ICD-10-CM

## 2015-03-22 DIAGNOSIS — I482 Chronic atrial fibrillation: Secondary | ICD-10-CM

## 2015-03-22 DIAGNOSIS — K5900 Constipation, unspecified: Secondary | ICD-10-CM

## 2015-03-22 DIAGNOSIS — D62 Acute posthemorrhagic anemia: Secondary | ICD-10-CM | POA: Diagnosis present

## 2015-03-22 DIAGNOSIS — M353 Polymyalgia rheumatica: Secondary | ICD-10-CM

## 2015-03-22 LAB — COMPREHENSIVE METABOLIC PANEL
ALT: 9 U/L — ABNORMAL LOW (ref 17–63)
AST: 17 U/L (ref 15–41)
Albumin: 2.8 g/dL — ABNORMAL LOW (ref 3.5–5.0)
Alkaline Phosphatase: 35 U/L — ABNORMAL LOW (ref 38–126)
Anion gap: 6 (ref 5–15)
BUN: 18 mg/dL (ref 6–20)
CHLORIDE: 107 mmol/L (ref 101–111)
CO2: 25 mmol/L (ref 22–32)
Calcium: 8.3 mg/dL — ABNORMAL LOW (ref 8.9–10.3)
Creatinine, Ser: 1.21 mg/dL (ref 0.61–1.24)
GFR, EST NON AFRICAN AMERICAN: 54 mL/min — AB (ref >60)
Glucose, Bld: 96 mg/dL (ref 65–99)
POTASSIUM: 4.5 mmol/L (ref 3.5–5.1)
SODIUM: 138 mmol/L (ref 135–145)
Total Bilirubin: 0.8 mg/dL (ref 0.3–1.2)
Total Protein: 4.9 g/dL — ABNORMAL LOW (ref 6.5–8.1)

## 2015-03-22 LAB — MAGNESIUM: Magnesium: 1.8 mg/dL (ref 1.7–2.4)

## 2015-03-22 LAB — CBC WITH DIFFERENTIAL/PLATELET
Basophils Absolute: 0 10*3/uL (ref 0.0–0.1)
Basophils Relative: 0 %
Eosinophils Absolute: 0 10*3/uL (ref 0.0–0.7)
Eosinophils Relative: 0 %
HCT: 29.7 % — ABNORMAL LOW (ref 39.0–52.0)
HEMOGLOBIN: 9.8 g/dL — AB (ref 13.0–17.0)
LYMPHS ABS: 1.7 10*3/uL (ref 0.7–4.0)
LYMPHS PCT: 11 %
MCH: 33.7 pg (ref 26.0–34.0)
MCHC: 33 g/dL (ref 30.0–36.0)
MCV: 102.1 fL — AB (ref 78.0–100.0)
Monocytes Absolute: 1.6 10*3/uL — ABNORMAL HIGH (ref 0.1–1.0)
Monocytes Relative: 10 %
NEUTROS PCT: 79 %
Neutro Abs: 12.4 10*3/uL — ABNORMAL HIGH (ref 1.7–7.7)
Platelets: 151 10*3/uL (ref 150–400)
RBC: 2.91 MIL/uL — AB (ref 4.22–5.81)
RDW: 16.5 % — ABNORMAL HIGH (ref 11.5–15.5)
WBC: 15.7 10*3/uL — AB (ref 4.0–10.5)

## 2015-03-22 LAB — LACTIC ACID, PLASMA
Lactic Acid, Venous: 1 mmol/L (ref 0.5–2.0)
Lactic Acid, Venous: 1.2 mmol/L (ref 0.5–2.0)

## 2015-03-22 LAB — PHOSPHORUS: PHOSPHORUS: 3.8 mg/dL (ref 2.5–4.6)

## 2015-03-22 LAB — MRSA PCR SCREENING: MRSA BY PCR: NEGATIVE

## 2015-03-22 MED ORDER — ATORVASTATIN CALCIUM 10 MG PO TABS
20.0000 mg | ORAL_TABLET | Freq: Every day | ORAL | Status: DC
  Administered 2015-03-23 – 2015-03-24 (×2): 20 mg via ORAL
  Filled 2015-03-22: qty 1
  Filled 2015-03-22: qty 2

## 2015-03-22 MED ORDER — ENOXAPARIN SODIUM 40 MG/0.4ML ~~LOC~~ SOLN
40.0000 mg | SUBCUTANEOUS | Status: DC
  Administered 2015-03-22 – 2015-03-23 (×2): 40 mg via SUBCUTANEOUS
  Filled 2015-03-22 (×2): qty 0.4

## 2015-03-22 MED ORDER — HYDROCODONE-ACETAMINOPHEN 10-325 MG PO TABS
1.0000 | ORAL_TABLET | ORAL | Status: DC | PRN
  Administered 2015-03-22 – 2015-03-24 (×6): 1 via ORAL
  Filled 2015-03-22 (×7): qty 1

## 2015-03-22 MED ORDER — KETOROLAC TROMETHAMINE 30 MG/ML IJ SOLN
30.0000 mg | Freq: Once | INTRAMUSCULAR | Status: AC
  Administered 2015-03-22: 30 mg via INTRAVENOUS
  Filled 2015-03-22: qty 1

## 2015-03-22 NOTE — Progress Notes (Signed)
Patient ID: Kevin Holms., male   DOB: December 07, 1929, 81 y.o.   MRN: [id number] Much better, wbc down. C/o incisional pain. Urine cultures pending. Spoke with him and family

## 2015-03-22 NOTE — Evaluation (Addendum)
Physical Therapy Evaluation Patient Details Name: Kevin Carlson. MRN: [id number] DOB: 10-13-1929 Today's Date: 03/22/2015   History of Present Illness  Kevin Carlson. is an 81 y.o. 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  Clinical Impression  Pt admitted with  The above complications. Pt currently with functional limitations due to the deficits listed below (see PT Problem List). Mr. Thieu was ambulating at a supervision level up to 150 feet prior to discharge 11/19. Currently demonstrates instability requiring min guard to min assist with mobility. Ambulates 85 feet this afternoon, using a rolling walker and close guard for safety. Feeling better but not back to baseline. Daughter to stay with patient at d/c. Pt will benefit from skilled PT to increase their independence and safety with mobility to allow discharge to the venue listed below.       Follow Up Recommendations Home health PT (Pending progress)    Equipment Recommendations  None recommended by PT    Recommendations for Other Services OT consult     Precautions / Restrictions Precautions Precautions: Back Precaution Booklet Issued: No Precaution Comments: reviewed precautions Restrictions Weight Bearing Restrictions: No      Mobility  Bed Mobility Overal bed mobility: Needs Assistance Bed Mobility: Rolling;Sidelying to Sit;Sit to Sidelying Rolling: Supervision Sidelying to sit: Min assist     Sit to sidelying: Supervision General bed mobility comments: Supervision for safety with log roll and transition from sit to sidelying with VC for technique and safety. Min assist for truncal support to rise to EOB using  rail.  Transfers Overall transfer level: Needs assistance Equipment used: Rolling walker (2 wheeled) Transfers: Sit to/from Stand Sit to Stand: Min guard;Min assist         General transfer comment: Min assist for boost and balance from lowest bed setting. Took 2 attempts. LEs weak but able to support self well once up and holding RW.  Ambulation/Gait Ambulation/Gait assistance: Min guard Ambulation Distance (Feet): 85 Feet Assistive device: Rolling walker (2 wheeled) Gait Pattern/deviations: Step-through pattern;Decreased stride length;Trunk flexed Gait velocity: decreased Gait velocity interpretation: Below normal speed for age/gender General Gait Details: VC for upright posture and walker placement for proximity. Some difficulty sequencing at times with turns. VC for technique. Spasms with jerky movements noted at times but held self up with RW. Close guard at all times  Stairs            Wheelchair Mobility    Modified Rankin (Stroke Patients Only)       Balance Overall balance assessment: Needs assistance Sitting-balance support: No upper extremity supported;Feet supported Sitting balance-Leahy Scale: Good     Standing balance support: No upper extremity supported Standing balance-Leahy Scale: Fair                               Pertinent Vitals/Pain Pain Assessment: 0-10 Pain Score:  ("A little" no value given) Pain Location: head Pain Descriptors / Indicators: Aching Pain Intervention(s): Monitored during session;Repositioned    Home Living Family/patient expects to be discharged to:: Private residence Living Arrangements: Alone Available Help at Discharge: Family;Available 24 hours/day (daughter will be staying with pt) Type of Home: House Home Access: Level entry  Home Layout: One level (shower in basement) Home Equipment: Adaptive equipment;Walker - 2 wheels      Prior Function Level of Independence: Independent          Comments: Ambulating at a mod I level at d/c on 11/19. Admitted with inability to stand per family     Hand Dominance   Dominant Hand: Right    Extremity/Trunk Assessment   Upper Extremity Assessment: Defer to OT evaluation           Lower Extremity Assessment: Generalized weakness (Some tremors noted throughout)         Communication   Communication: No difficulties  Cognition Arousal/Alertness: Awake/alert Behavior During Therapy: WFL for tasks assessed/performed Overall Cognitive Status: Impaired/Different from baseline Area of Impairment: Problem solving             Problem Solving: Slow processing;Requires verbal cues;Difficulty sequencing      General Comments General comments (skin integrity, edema, etc.): SpO2 98% on room air with ambulation. Daughter present and supportive    Exercises        Assessment/Plan    PT Assessment Patient needs continued PT services  PT Diagnosis Difficulty walking;Abnormality of gait;Generalized weakness;Acute pain   PT Problem List Decreased strength;Decreased range of motion;Decreased activity tolerance;Decreased balance;Decreased mobility;Decreased knowledge of use of DME;Decreased cognition;Pain  PT Treatment Interventions DME instruction;Gait training;Stair training;Functional mobility training;Therapeutic activities;Therapeutic exercise;Balance training;Neuromuscular re-education;Cognitive remediation;Patient/family education;Modalities   PT Goals (Current goals can be found in the Care Plan section) Acute Rehab PT Goals Patient Stated Goal: Go home. PT Goal Formulation: With patient/family Time For Goal Achievement: 04/05/15 Potential to Achieve Goals: Good    Frequency Min 3X/week   Barriers to discharge        Co-evaluation               End of Session Equipment Utilized During Treatment: Gait belt Activity Tolerance: Patient tolerated treatment well Patient left: in bed;with call bell/phone  within reach;with family/visitor present Nurse Communication: Mobility status         Time: [id number] PT Time Calculation (min) (ACUTE ONLY): 24 min   Charges:   PT Evaluation $Initial PT Evaluation Tier I: 1 Procedure PT Treatments $Gait Training: 8-22 mins   PT G CodesEllouise Newer 03/22/2015, 4:07 PM Elayne Snare, [place]

## 2015-03-22 NOTE — Progress Notes (Addendum)
Pt is very shaky and weak this morning.  He was able to stand and walk just fine yesterday and today he cannot even support himself with his legs.

## 2015-03-22 NOTE — Progress Notes (Signed)
Internal Medicine Consult/Progress Note   Kevin Carlson. [id number] DOB: 25-Apr-1930 DOA: 03/21/2015 PCP: Kennieth Rad, MD   Brief Narrative:   Kevin Carlson. is an 81 y.o. male with a PMH of hypertension, osteopenia, arthritis, hyperlipidemia, GERD, polymyalgia rheumatica, chronic constipation secondary to opiates, anemia, and spinal stenosis who underwent lumbar decompression 03/20/15 and was discharged home 03/21/15, but returned to the ED due to fever of more than 1065F and confusion as per family members. Admitted by neurosurgeon.  IM asked to consult for assistance with management.  Assessment/Plan:   Principal Problem: Sepsis due to undetermined organism ([place]) - No obvious source of infection at this time. WBC and lactic acid both elevated on admission with fever to 10 65F. - Continue IV vancomycin and Zosyn. Follow-up blood and urine cultures. Chest x-ray negative. - Continue IV fluids.  Active Problems: Hypertension - Continue amlodipine, benazepril and metoprolol. - Blood pressures 96-147/58-81  Hyperlipidemia - Continue simvastatin 40 mg by mouth daily. - Monitor LFTs periodically.  Atrial fibrillation ([place]) - EKG on admission showed atrial fibrillation with a rate of 71. No old EKGs for comparison. - Continue metoprolol for rate control. Not on chronic anticoagulation.  Followed by Dr. Alroy Dust of cardiology [facility]) who deferred. - F/U echocardiogram. Had a heart cath around 4 years ago, and did not have any blockages per patient.  GERD (gastroesophageal reflux disease) - Continue Pepcid, also on Protonix.   Polymyalgia rheumatica (HCC) - Continue prednisone 5 mg by mouth daily.  Acute anemia secondary to operative blood loss - Hemoglobin was 13 preoperatively. Monitor hematocrit and hemoglobin.  Chronic constipation - Continue MiraLAX as needed.  DVT Prophylaxis - Lovenox ordered.   Family Communication/Anticipated D/C date and  plan/Code Status   Family Communication: Girlfriend and daughter at the bedside. Disposition Plan: Home when afebrile and culture data resulted. Anticipated D/C date:   03/25/15 Code Status: Full code.   IV Access:    Peripheral IV   Procedures and diagnostic studies:   Dg Chest 2 View  03/21/2015  CLINICAL DATA:  Fever. Hx HTN. EXAM: CHEST  2 VIEW COMPARISON:  None. FINDINGS: Lateral view degraded by patient arm position. Midline trachea. Mild cardiomegaly with transverse aortic atherosclerosis. No pleural effusion or pneumothorax. Clear lungs. IMPRESSION: Mild cardiomegaly and atherosclerosis.  No acute findings. Electronically Signed   By: Abigail Miyamoto M.D.   On: 03/21/2015 20:36    Anti-Infectives:   Anti-infectives    Start     Dose/Rate Route Frequency Ordered Stop   03/22/15 0700  vancomycin (VANCOCIN) IVPB 1000 mg/200 mL premix     1,000 mg 200 mL/hr over 60 Minutes Intravenous Every 12 hours 03/21/15 1859     03/22/15 0300  piperacillin-tazobactam (ZOSYN) IVPB 3.375 g     3.375 g 12.5 mL/hr over 240 Minutes Intravenous 3 times per day 03/21/15 1859     03/21/15 1900  piperacillin-tazobactam (ZOSYN) IVPB 3.375 g     3.375 g 100 mL/hr over 30 Minutes Intravenous  Once 03/21/15 1852 03/21/15 2039   03/21/15 1900  vancomycin (VANCOCIN) IVPB 1000 mg/200 mL premix     1,000 mg 200 mL/hr over 60 Minutes Intravenous  Once 03/21/15 1852 03/21/15 2109      Subjective:   [name]. reports a headache and some back pain. No chest pain. No dyspnea/cough.  No nausea or vomiting.  Objective:    Filed Vitals:   03/22/15 0400 03/22/15 0502 03/22/15 0600 03/22/15 [id number]  BP: 111/58 121/66 147/81 96/58  Pulse: 80 72 103 107  Temp:  98.8 F (37.1 C)  98.4 F (36.9 C)  TempSrc:  Oral  Oral  Resp: 15 14 17 14   Height:      Weight:      SpO2: 99% 100% 100% 97%    Intake/Output Summary (Last 24 hours) at 03/22/15 0924 Last data filed at 03/22/15 [id number]  Gross per  24 hour  Intake   6250 ml  Output   1150 ml  Net   5100 ml   Filed Weights   03/21/15 2237  Weight: 88.542 kg (195 lb 3.2 oz)    Exam: Gen:  NAD Cardiovascular:  HSIR, No M/R/G Respiratory:  Lungs CTAB Gastrointestinal:  Abdomen soft, NT/ND, + BS Extremities:  No C/E/C Back: Incision C,D,I lower lumbar area, no wound dehiscence or signs of infection   Data Reviewed:    Labs: Basic Metabolic Panel:  Recent Labs Lab 03/17/15 1503 03/21/15 1844 03/21/15 2310 03/22/15 0150  NA 140 136  --  138  K 4.4 4.4  --  4.5  CL 107 100*  --  107  CO2 27 27  --  25  GLUCOSE 95 123*  --  96  BUN 14 16  --  18  CREATININE 1.06 1.20  --  1.21  CALCIUM 9.4 9.6  --  8.3*  MG  --   --  1.8  --   PHOS  --   --  3.8  --    GFR Estimated Creatinine Clearance: 53.8 mL/min (by C-G formula based on Cr of 1.21). Liver Function Tests:  Recent Labs Lab 03/21/15 1844 03/22/15 0150  AST 24 17  ALT 12* 9*  ALKPHOS 45 35*  BILITOT 0.8 0.8  PROT 6.4* 4.9*  ALBUMIN 3.8 2.8*   CBC:  Recent Labs Lab 03/17/15 1503 03/21/15 1844 03/22/15 0150  WBC 10.9* 18.3* 15.7*  NEUTROABS  --  15.1* 12.4*  HGB 13.0 12.4* 9.8*  HCT 39.5 37.7* 29.7*  MCV 101.8* 101.3* 102.1*  PLT 198 179 151   Sepsis Labs:  Recent Labs Lab 03/17/15 1503 03/21/15 1844 03/21/15 1858 03/21/15 2319 03/22/15 0150 03/22/15 0710  WBC 10.9* 18.3*  --   --  15.7*  --   LATICACIDVEN  --   --  0.99 2.64*  --  1.0   Microbiology Recent Results (from the past 240 hour(s))  Surgical pcr screen     Status: None   Collection Time: 03/17/15  3:03 PM  Result Value Ref Range Status   MRSA, PCR NEGATIVE NEGATIVE Final   Staphylococcus aureus NEGATIVE NEGATIVE Final    Comment:        The Xpert SA Assay (FDA approved for NASAL specimens in patients over 79 years of age), is one component of a comprehensive surveillance program.  Test performance has been validated by [facility] for patients greater than or  equal to 1 year old. It is not intended to diagnose infection nor to guide or monitor treatment.   MRSA PCR Screening     Status: None   Collection Time: 03/21/15 10:51 PM  Result Value Ref Range Status   MRSA by PCR NEGATIVE NEGATIVE Final    Comment:        The GeneXpert MRSA Assay (FDA approved for NASAL specimens only), is one component of a comprehensive MRSA colonization surveillance program. It is not intended to diagnose MRSA infection nor to guide or monitor treatment for MRSA  infections.      Medications:   . amLODipine  5 mg Oral Daily   And  . benazepril  10 mg Oral Daily  . gabapentin  600 mg Oral BID  . metoprolol tartrate  25 mg Oral Daily  . pantoprazole  20 mg Oral Daily  . piperacillin-tazobactam (ZOSYN)  IV  3.375 g Intravenous 3 times per day  . predniSONE  5 mg Oral Q breakfast  . simvastatin  40 mg Oral Daily  . tamsulosin  0.4 mg Oral QPC supper  . tiZANidine  4 mg Oral QHS  . vancomycin  1,000 mg Intravenous Q12H   Continuous Infusions:   Time spent: 35 minutes with > 50% of time discussing current diagnostic test results, clinical impression and plan of care.   LOS: 1 day   Maygen Sirico  [facility] Pager [phone]. If unable to reach me by pager, please call my cell phone at [phone].  *Please refer to [email or web address], password [email or web address] to get updated schedule on who will round on this patient, as hospitalists switch teams weekly. If 7PM-7AM, please contact night-coverage at [email or web address], password [email or web address] for any overnight needs.  03/22/2015, 9:24 AM

## 2015-03-22 NOTE — Progress Notes (Signed)
Utilization Review Completed.Kevin Carlson T11/20/2016  

## 2015-03-23 ENCOUNTER — Encounter (HOSPITAL_COMMUNITY): Payer: Self-pay | Admitting: Neurosurgery

## 2015-03-23 DIAGNOSIS — R5082 Postprocedural fever: Secondary | ICD-10-CM

## 2015-03-23 DIAGNOSIS — M542 Cervicalgia: Secondary | ICD-10-CM

## 2015-03-23 DIAGNOSIS — G44009 Cluster headache syndrome, unspecified, not intractable: Secondary | ICD-10-CM

## 2015-03-23 LAB — CBC WITH DIFFERENTIAL/PLATELET
BASOS ABS: 0 10*3/uL (ref 0.0–0.1)
BASOS PCT: 0 %
EOS ABS: 0 10*3/uL (ref 0.0–0.7)
EOS PCT: 0 %
HCT: 29.1 % — ABNORMAL LOW (ref 39.0–52.0)
HEMOGLOBIN: 9.5 g/dL — AB (ref 13.0–17.0)
LYMPHS ABS: 1.4 10*3/uL (ref 0.7–4.0)
Lymphocytes Relative: 11 %
MCH: 33.1 pg (ref 26.0–34.0)
MCHC: 32.6 g/dL (ref 30.0–36.0)
MCV: 101.4 fL — ABNORMAL HIGH (ref 78.0–100.0)
Monocytes Absolute: 1 10*3/uL (ref 0.1–1.0)
Monocytes Relative: 8 %
NEUTROS PCT: 81 %
Neutro Abs: 10.3 10*3/uL — ABNORMAL HIGH (ref 1.7–7.7)
PLATELETS: 141 10*3/uL — AB (ref 150–400)
RBC: 2.87 MIL/uL — AB (ref 4.22–5.81)
RDW: 16.3 % — ABNORMAL HIGH (ref 11.5–15.5)
WBC: 12.7 10*3/uL — AB (ref 4.0–10.5)

## 2015-03-23 LAB — COMPREHENSIVE METABOLIC PANEL
ALK PHOS: 40 U/L (ref 38–126)
ALT: 8 U/L — AB (ref 17–63)
AST: 17 U/L (ref 15–41)
Albumin: 2.8 g/dL — ABNORMAL LOW (ref 3.5–5.0)
Anion gap: 8 (ref 5–15)
BILIRUBIN TOTAL: 0.6 mg/dL (ref 0.3–1.2)
BUN: 21 mg/dL — AB (ref 6–20)
CALCIUM: 8.4 mg/dL — AB (ref 8.9–10.3)
CHLORIDE: 102 mmol/L (ref 101–111)
CO2: 24 mmol/L (ref 22–32)
CREATININE: 1.22 mg/dL (ref 0.61–1.24)
GFR, EST NON AFRICAN AMERICAN: 53 mL/min — AB (ref >60)
Glucose, Bld: 118 mg/dL — ABNORMAL HIGH (ref 65–99)
Potassium: 3.8 mmol/L (ref 3.5–5.1)
Sodium: 134 mmol/L — ABNORMAL LOW (ref 135–145)
TOTAL PROTEIN: 5.2 g/dL — AB (ref 6.5–8.1)

## 2015-03-23 LAB — URINE CULTURE: CULTURE: NO GROWTH

## 2015-03-23 MED ORDER — CALCIUM CARBONATE ANTACID 500 MG PO CHEW
1.0000 | CHEWABLE_TABLET | Freq: Three times a day (TID) | ORAL | Status: DC | PRN
  Administered 2015-03-23: 400 mg via ORAL
  Filled 2015-03-23: qty 2

## 2015-03-23 MED ORDER — TAMSULOSIN HCL 0.4 MG PO CAPS
0.4000 mg | ORAL_CAPSULE | Freq: Every day | ORAL | Status: DC
  Administered 2015-03-23 – 2015-03-25 (×3): 0.4 mg via ORAL
  Filled 2015-03-23 (×3): qty 1

## 2015-03-23 MED ORDER — SODIUM CHLORIDE 0.9 % IV BOLUS (SEPSIS)
1000.0000 mL | Freq: Once | INTRAVENOUS | Status: AC
  Administered 2015-03-23: 1000 mL via INTRAVENOUS

## 2015-03-23 MED ORDER — ACETAMINOPHEN 325 MG PO TABS
650.0000 mg | ORAL_TABLET | Freq: Four times a day (QID) | ORAL | Status: DC | PRN
  Administered 2015-03-23 – 2015-03-24 (×3): 650 mg via ORAL
  Filled 2015-03-23 (×3): qty 2

## 2015-03-23 NOTE — Progress Notes (Signed)
Physical Therapy Treatment Patient Details Name: Kevin Carlson. MRN: [id number] DOB: 22-Sep-1929 Today's Date: 03/23/2015    History of Present Illness Kevin Carlson. is an 81 y.o. 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    PT Comments    Pt admitted with above diagnosis. Pt currently with functional limitations due to balance and endurance deficits as well as limited by nausea and vomiting today.  Pt having a difficult time today as he is in incr pain and vomiting.  Will continue as able.  Pt will benefit from skilled PT to increase their independence and safety with mobility to allow discharge to the venue listed below.    Follow Up Recommendations  Home health PT (Pending progress)     Equipment Recommendations  None recommended by PT    Recommendations for Other Services OT consult     Precautions / Restrictions Precautions Precautions: Back Precaution Booklet Issued: No Precaution Comments: reviewed precautions; pt currently on bedrest with bathroom privileges. Restrictions Weight Bearing Restrictions: No    Mobility  Bed Mobility Overal bed mobility: Needs Assistance;+2 for physical assistance Bed Mobility: Rolling;Sidelying to Sit;Sit to Sidelying Rolling: Supervision Sidelying to sit: Min assist       General bed mobility comments: Supervision for safety with log roll and transition from sit to sidelying with VC for technique and safety. Min assist for truncal support to rise to EOB using rail.  Once pt at EOB sitting, pt c/o nausea and stated he felt sick.  Obtained pan and pt began throwing up.  MD came in as pt throwing up and talked with pt family.  This PT  ensured that pt was finished with vomiting and laid him down in comfortable position.  nurse made aware that pt vomited.  IV team coming shortly to restart IV so pt can get nausea med.  Pt fell sleep prior to PT leaving the room.    Transfers                    Ambulation/Gait                 Stairs            Wheelchair Mobility    Modified Rankin (Stroke Patients Only)       Balance Overall balance assessment: Needs assistance Sitting-balance support: No upper extremity supported;Feet supported Sitting balance-Leahy Scale: Good         Standing balance comment: unable to stand due to vomiting                    Cognition Arousal/Alertness: Awake/alert Behavior During Therapy: WFL for tasks assessed/performed Overall Cognitive Status: Impaired/Different from baseline Area of Impairment: Problem solving             Problem Solving: Slow processing;Requires verbal cues;Difficulty sequencing      Exercises      General Comments General comments (skin integrity, edema, etc.): Daughters came in as pt was vomiting.  MD also came in and spoke with daughters as pt was vomiting.       Pertinent Vitals/Pain Pain Assessment: Faces Faces Pain Scale: Hurts little more Pain Location: back Pain Descriptors / Indicators: Aching;Sore Pain Intervention(s): Limited activity  within patient's tolerance;Monitored during session;Premedicated before session;Repositioned  VSS    Home Living                      Prior Function            PT Goals (current goals can now be found in the care plan section) Progress towards PT goals: Not progressing toward goals - comment (vomiting)    Frequency  Min 3X/week    PT Plan Current plan remains appropriate    Co-evaluation             End of Session   Activity Tolerance: Patient limited by fatigue (limited by nausea and vomiting) Patient left: in bed;with call bell/phone within  reach;with family/visitor present     Time: [id number] PT Time Calculation (min) (ACUTE ONLY): 10 min  Charges:  $Therapeutic Activity: 8-22 mins                    G CodesIrwin Brakeman F 2015-03-29, 2:36 PM [employer] [facility] [phone] (pager)

## 2015-03-23 NOTE — Consult Note (Signed)
TEAM 1 - Stepdown/ICU TEAM CONSULT F/U NOTE  Internal Medicine Consult F/U Note   Kevin Carlson. [id number] DOB: Jul 22, 1929 DOA: 03/21/2015 PCP: Kennieth Rad, MD   Brief Narrative:   Kevin Carlson. is an 81 y.o. male with a PMH of hypertension, osteopenia, arthritis, hyperlipidemia, GERD, polymyalgia rheumatica, chronic constipation secondary to opiates, anemia, and spinal stenosis who underwent lumbar decompression 03/20/15 and was discharged home 03/21/15, but returned to the ED due to fever of more than 1029F and confusion as per family members. Admitted by Neurosurgeon.  IM asked to consult for assistance with management.  Assessment/Plan:   Sepsis due to undetermined organism  - No obvious source of infection at this time. WBC and lactic acid both elevated on admission with fever to 10 29F. - Continue IV vancomycin and Zosyn. Follow-up blood and urine cultures. Chest x-ray negative. - ID has been consulted by the primary service   Hypertension - Continue amlodipine, benazepril and metoprolol - Blood pressures reasonably controlled - follow w/o change today given some dips into the 1-teens  Hyperlipidemia - Continue simvastatin 40 mg by mouth daily  Atrial fibrillation  - EKG on admission showed atrial fibrillation with a rate of 71. No old EKGs for comparison. - Continue metoprolol for rate control. Not on chronic anticoagulation.  Followed by Dr. Alroy Dust of cardiology [facility]) who deferred. - Had a heart cath around 4 years ago, and did not have any blockages per patient.  GERD  - Continue Pepcid  Polymyalgia rheumatica - Continue prednisone 5 mg by mouth daily  Acute anemia secondary to operative blood loss - Hemoglobin was 13 preoperatively. Monitor hematocrit and hemoglobin.  Chronic constipation - Continue MiraLAX as needed.  DVT Prophylaxis - at discretion of primary team    Family Communication/Anticipated D/C date and plan/Code  Status   Family Communication:  Code Status: Full code.   IV Access:    Peripheral IV   Procedures and diagnostic studies:   Dg Chest 2 View  03/21/2015  CLINICAL DATA:  Fever. Hx HTN. EXAM: CHEST  2 VIEW COMPARISON:  None. FINDINGS: Lateral view degraded by patient arm position. Midline trachea. Mild cardiomegaly with transverse aortic atherosclerosis. No pleural effusion or pneumothorax. Clear lungs. IMPRESSION: Mild cardiomegaly and atherosclerosis.  No acute findings. Electronically Signed   By: Abigail Miyamoto M.D.   On: 03/21/2015 20:36    Anti-Infectives:   Anti-infectives    Start     Dose/Rate Route Frequency Ordered Stop   03/22/15 0700  vancomycin (VANCOCIN) IVPB 1000 mg/200 mL premix     1,000 mg 200 mL/hr over 60 Minutes Intravenous Every 12 hours 03/21/15 1859     03/22/15 0300  piperacillin-tazobactam (ZOSYN) IVPB 3.375 g     3.375 g 12.5 mL/hr over 240 Minutes Intravenous 3 times per day 03/21/15 1859     03/21/15 1900  piperacillin-tazobactam (ZOSYN) IVPB 3.375 g     3.375 g 100 mL/hr over 30 Minutes Intravenous  Once 03/21/15 1852 03/21/15 2039   03/21/15 1900  vancomycin (VANCOCIN) IVPB 1000 mg/200 mL premix     1,000 mg 200 mL/hr over 60 Minutes Intravenous  Once 03/21/15 1852 03/21/15 2109      Subjective:  ID being consulted to evaluate FUO per primary service.  Other medical issues appear stable at this time.  No exam from [facility] today.  We will continue to follow w/ you.     Objective:    Filed Vitals:  03/23/15 0930 03/23/15 1100 03/23/15 1300 03/23/15 1518  BP: 139/64 117/83 149/78 151/95  Pulse: 109 73 113 101  Temp:   99 F (37.2 C) 99.4 F (37.4 C)  TempSrc:   Oral Oral  Resp:  13 15 15   Height:      Weight:      SpO2:  97% 92% 94%    Intake/Output Summary (Last 24 hours) at 03/23/15 1657 Last data filed at 03/23/15 1500  Gross per 24 hour  Intake   1650 ml  Output   3700 ml  Net  -2050 ml   Filed Weights   03/21/15 2237    Weight: 88.542 kg (195 lb 3.2 oz)    Exam: No exam from [facility] today   Data Reviewed:    Labs: Basic Metabolic Panel:  Recent Labs Lab 03/17/15 1503 03/21/15 1844 03/21/15 2310 03/22/15 0150 03/23/15 0205  NA 140 136  --  138 134*  K 4.4 4.4  --  4.5 3.8  CL 107 100*  --  107 102  CO2 27 27  --  25 24  GLUCOSE 95 123*  --  96 118*  BUN 14 16  --  18 21*  CREATININE 1.06 1.20  --  1.21 1.22  CALCIUM 9.4 9.6  --  8.3* 8.4*  MG  --   --  1.8  --   --   PHOS  --   --  3.8  --   --    Liver Function Tests:  Recent Labs Lab 03/21/15 1844 03/22/15 0150 03/23/15 0205  AST 24 17 17   ALT 12* 9* 8*  ALKPHOS 45 35* 40  BILITOT 0.8 0.8 0.6  PROT 6.4* 4.9* 5.2*  ALBUMIN 3.8 2.8* 2.8*   CBC:  Recent Labs Lab 03/17/15 1503 03/21/15 1844 03/22/15 0150 03/23/15 0205  WBC 10.9* 18.3* 15.7* 12.7*  NEUTROABS  --  15.1* 12.4* 10.3*  HGB 13.0 12.4* 9.8* 9.5*  HCT 39.5 37.7* 29.7* 29.1*  MCV 101.8* 101.3* 102.1* 101.4*  PLT 198 179 151 141*   Sepsis Labs:  Recent Labs Lab 03/17/15 1503 03/21/15 1844 03/21/15 1858 03/21/15 2319 03/22/15 0150 03/22/15 0710 03/22/15 1015 03/23/15 0205  WBC 10.9* 18.3*  --   --  15.7*  --   --  12.7*  LATICACIDVEN  --   --  0.99 2.64*  --  1.0 1.2  --    Microbiology Recent Results (from the past 240 hour(s))  Surgical pcr screen     Status: None   Collection Time: 03/17/15  3:03 PM  Result Value Ref Range Status   MRSA, PCR NEGATIVE NEGATIVE Final   Staphylococcus aureus NEGATIVE NEGATIVE Final    Comment:        The Xpert SA Assay (FDA approved for NASAL specimens in patients over 77 years of age), is one component of a comprehensive surveillance program.  Test performance has been validated by [facility] for patients greater than or equal to 44 year old. It is not intended to diagnose infection nor to guide or monitor treatment.   Culture, blood (routine x 2)     Status: None (Preliminary result)   Collection  Time: 03/21/15  6:45 PM  Result Value Ref Range Status   Specimen Description BLOOD RIGHT ARM  Final   Special Requests BOTTLES DRAWN AEROBIC AND ANAEROBIC 10ML  Final   Culture NO GROWTH 2 DAYS  Final   Report Status PENDING  Incomplete  Culture, blood (routine x  2)     Status: None (Preliminary result)   Collection Time: 03/21/15  7:07 PM  Result Value Ref Range Status   Specimen Description BLOOD LEFT ARM  Final   Special Requests BOTTLES DRAWN AEROBIC AND ANAEROBIC 10ML  Final   Culture NO GROWTH 2 DAYS  Final   Report Status PENDING  Incomplete  Urine culture     Status: None   Collection Time: 03/21/15  7:50 PM  Result Value Ref Range Status   Specimen Description URINE, CLEAN CATCH  Final   Special Requests NONE  Final   Culture NO GROWTH 2 DAYS  Final   Report Status 03/23/2015 FINAL  Final  MRSA PCR Screening     Status: None   Collection Time: 03/21/15 10:51 PM  Result Value Ref Range Status   MRSA by PCR NEGATIVE NEGATIVE Final    Comment:        The GeneXpert MRSA Assay (FDA approved for NASAL specimens only), is one component of a comprehensive MRSA colonization surveillance program. It is not intended to diagnose MRSA infection nor to guide or monitor treatment for MRSA infections.      Medications:   . amLODipine  5 mg Oral Daily   And  . benazepril  10 mg Oral Daily  . atorvastatin  20 mg Oral q1800  . gabapentin  600 mg Oral BID  . metoprolol tartrate  25 mg Oral Daily  . pantoprazole  20 mg Oral Daily  . piperacillin-tazobactam (ZOSYN)  IV  3.375 g Intravenous 3 times per day  . predniSONE  5 mg Oral Q breakfast  . tamsulosin  0.4 mg Oral Daily  . tiZANidine  4 mg Oral QHS  . vancomycin  1,000 mg Intravenous Q12H    Time spent: no charge - chart and full hx reviewed but pt not examined    LOS: 2 days    Cherene Altes, MD [facility] For Consults/Admissions - Flow Manager - [phone] Office  [phone]  Contact MD  directly via text page:      [email or web address]      password [email or web address]   03/23/2015, 4:57 PM

## 2015-03-23 NOTE — Consult Note (Signed)
[facility]  Total days of antibiotics 3        Day 3 vanco/piptazo               Reason for Consult: post-op fever   Referring Physician: botero  Principal Problem:   Sepsis due to undetermined organism [facility]) Active Problems:   Hypertension   Hyperlipidemia   Atrial fibrillation (HCC)   GERD (gastroesophageal reflux disease)   Polymyalgia rheumatica (HCC)   Chronic constipation   Postoperative anemia due to acute blood loss    HPI: Kevin Carlson. is a 81 y.o. male  with a PMH of hypertension, osteopenia, arthritis, hyperlipidemia, GERD, polymyalgia rheumatica, chronic constipation secondary to opiates, anemia, who had symptomatic spinal stenosis with neurogenic claudication and radiculopathy. He underwent lumbar decompression of L3-L5  On 03/20/15 without difficulty. He was discharged the following day, felt fine walking at home. Later that evening started to feel poorly with headache, neck pain, high fevers and mental confusion. He was brought back to the ED for evaluation. He was found to have high wbc, LA of 2.6. No hypotension. His infectious work up showed a negative UA, clear CXR. Blood cx at 40 hr still NGTD. He was empirically started on vancomycin and piptazo. His daughter, and girlfriend are at his bedside commenting that this is not his baseline. He denies any recent illness. On pre op visit on 11/15 his WBC was 10K. He denies any recent sick contacts. ID consulted for recs. He still complains of headache, neck pain. But no photophobia. He suffers for bloating and reflux   Past Medical History  Diagnosis Date  . Hypertension   . Osteopenia   . Arthritis   . Hyperlipemia   . GERD (gastroesophageal reflux disease)   . DDD (degenerative disc disease), cervical   . Polymyalgia rheumatica ([place])   . Constipation due to opioid therapy   . Nasal congestion   . Anemia   . Carpal tunnel syndrome   . Neuropathy ([place])   . Dysrhythmia   . Cancer  ([place])     Skin Cancer-   . History of hiatal hernia   . BPH (benign prostatic hyperplasia)   . Complication of anesthesia 2011    " bleeding from Esophagus after extubated, had to have an endo    Allergies:  Allergies  Allergen Reactions  . Codeine Other (See Comments)    fidgety    MEDICATIONS: . amLODipine  5 mg Oral Daily   And  . benazepril  10 mg Oral Daily  . atorvastatin  20 mg Oral q1800  . gabapentin  600 mg Oral BID  . metoprolol tartrate  25 mg Oral Daily  . pantoprazole  20 mg Oral Daily  . piperacillin-tazobactam (ZOSYN)  IV  3.375 g Intravenous 3 times per day  . predniSONE  5 mg Oral Q breakfast  . tamsulosin  0.4 mg Oral Daily  . tiZANidine  4 mg Oral QHS  . vancomycin  1,000 mg Intravenous Q12H    Social History  Substance Use Topics  . Smoking status: Former Smoker -- 25 years    Quit date: 05/02/1974  . Smokeless tobacco: None     Comment: quit smoking in 1976  . Alcohol Use: No    History reviewed. No pertinent family history.   Review of Systems  Constitutional: positive for fever, chills, diaphoresis, activity change, appetite change, fatigue and unexpected weight change.  HENT: Negative for congestion, sore throat,  rhinorrhea, sneezing, trouble swallowing and sinus pressure.  Eyes: Negative for photophobia and visual disturbance.  Respiratory: Negative for cough, chest tightness, shortness of breath, wheezing and stridor.  Cardiovascular: Negative for chest pain, palpitations and leg swelling.  Gastrointestinal: positive for GERD, bloating. Negative for nausea, vomiting, abdominal pain, diarrhea, constipation, blood in stool, abdominal distention and anal bleeding.  Genitourinary: Negative for dysuria, hematuria, flank pain and difficulty urinating.  Musculoskeletal: Negative for myalgias, back pain, joint swelling, arthralgias and gait problem.  Skin: Negative for color change, pallor, rash and wound.  Neurological: + headache. Negative for  dizziness, tremors, weakness and light-headedness.  Hematological: Negative for adenopathy. Does not bruise/bleed easily.  Psychiatric/Behavioral: Negative for behavioral problems, confusion, sleep disturbance, dysphoric mood, decreased concentration and agitation.     OBJECTIVE: Temp:  [98.1 F (36.7 C)-103.3 F (39.6 C)] 99 F (37.2 C) (11/21 1300) Pulse Rate:  [61-113] 113 (11/21 1300) Resp:  [13-22] 15 (11/21 1300) BP: (86-159)/(46-92) 149/78 mmHg (11/21 1300) SpO2:  [92 %-100 %] 92 % (11/21 1300) Physical Exam  Constitutional: He is oriented to person, place, and time. He appears well-developed and well-nourished. No distress.  HENT:  Mouth/Throat: Oropharynx is clear and moist. No oropharyngeal exudate.  Cardiovascular: Normal rate, regular rhythm and normal heart sounds. Exam reveals no gallop and no friction rub.  No murmur heard.  Pulmonary/Chest: Effort normal and breath sounds normal. No respiratory distress. He has no wheezes.  Abdominal: Soft. Bowel sounds are normal. He exhibits no distension. There is no tenderness. Both flanks have ecchymosis possibly from straps? Lymphadenopathy:  He has no cervical adenopathy.  Back: slight bruising of tissue next but no erythema, fluctuance or drainage at incision site Neurological: He is alert and oriented to person, place, and time.  Skin: Skin is warm and dry. No rash noted. No erythema.  Psychiatric: He has a normal mood and affect. His behavior is normal.    LABS: Results for orders placed or performed during the hospital encounter of 03/21/15 (from the past 48 hour(s))  Comprehensive metabolic panel     Status: Abnormal   Collection Time: 03/21/15  6:44 PM  Result Value Ref Range   Sodium 136 135 - 145 mmol/L   Potassium 4.4 3.5 - 5.1 mmol/L   Chloride 100 (L) 101 - 111 mmol/L   CO2 27 22 - 32 mmol/L   Glucose, Bld 123 (H) 65 - 99 mg/dL   BUN 16 6 - 20 mg/dL   Creatinine, Ser 1.20 0.61 - 1.24 mg/dL   Calcium 9.6 8.9  - 10.3 mg/dL   Total Protein 6.4 (L) 6.5 - 8.1 g/dL   Albumin 3.8 3.5 - 5.0 g/dL   AST 24 15 - 41 U/L   ALT 12 (L) 17 - 63 U/L   Alkaline Phosphatase 45 38 - 126 U/L   Total Bilirubin 0.8 0.3 - 1.2 mg/dL   GFR calc non Af Amer 54 (L) >60 mL/min   GFR calc Af Amer >60 >60 mL/min    Comment: (NOTE) The eGFR has been calculated using the CKD EPI equation. This calculation has not been validated in all clinical situations. eGFR's persistently <60 mL/min signify possible Chronic Kidney Disease.    Anion gap 9 5 - 15  CBC with Differential     Status: Abnormal   Collection Time: 03/21/15  6:44 PM  Result Value Ref Range   WBC 18.3 (H) 4.0 - 10.5 K/uL   RBC 3.72 (L) 4.22 - 5.81 MIL/uL   Hemoglobin  12.4 (L) 13.0 - 17.0 g/dL   HCT 37.7 (L) 39.0 - 52.0 %   MCV 101.3 (H) 78.0 - 100.0 fL   MCH 33.3 26.0 - 34.0 pg   MCHC 32.9 30.0 - 36.0 g/dL   RDW 16.3 (H) 11.5 - 15.5 %   Platelets 179 150 - 400 K/uL   Neutrophils Relative % 82 %   Neutro Abs 15.1 (H) 1.7 - 7.7 K/uL   Lymphocytes Relative 9 %   Lymphs Abs 1.7 0.7 - 4.0 K/uL   Monocytes Relative 9 %   Monocytes Absolute 1.6 (H) 0.1 - 1.0 K/uL   Eosinophils Relative 0 %   Eosinophils Absolute 0.0 0.0 - 0.7 K/uL   Basophils Relative 0 %   Basophils Absolute 0.0 0.0 - 0.1 K/uL  Culture, blood (routine x 2)     Status: None (Preliminary result)   Collection Time: 03/21/15  6:45 PM  Result Value Ref Range   Specimen Description BLOOD RIGHT ARM    Special Requests BOTTLES DRAWN AEROBIC AND ANAEROBIC 10ML    Culture NO GROWTH 2 DAYS    Report Status PENDING   I-Stat CG4 Lactic Acid, ED  (not at [facility])     Status: None   Collection Time: 03/21/15  6:58 PM  Result Value Ref Range   Lactic Acid, Venous 0.99 0.5 - 2.0 mmol/L  Culture, blood (routine x 2)     Status: None (Preliminary result)   Collection Time: 03/21/15  7:07 PM  Result Value Ref Range   Specimen Description BLOOD LEFT ARM    Special Requests BOTTLES DRAWN AEROBIC AND  ANAEROBIC 10ML    Culture NO GROWTH 2 DAYS    Report Status PENDING   Urinalysis, Routine w reflex microscopic (not at [facility])     Status: None   Collection Time: 03/21/15  7:50 PM  Result Value Ref Range   Color, Urine YELLOW YELLOW   APPearance CLEAR CLEAR   Specific Gravity, Urine 1.013 1.005 - 1.030   pH 6.5 5.0 - 8.0   Glucose, UA NEGATIVE NEGATIVE mg/dL   Hgb urine dipstick NEGATIVE NEGATIVE   Bilirubin Urine NEGATIVE NEGATIVE   Ketones, ur NEGATIVE NEGATIVE mg/dL   Protein, ur NEGATIVE NEGATIVE mg/dL   Nitrite NEGATIVE NEGATIVE   Leukocytes, UA NEGATIVE NEGATIVE    Comment: MICROSCOPIC NOT DONE ON URINES WITH NEGATIVE PROTEIN, BLOOD, LEUKOCYTES, NITRITE, OR GLUCOSE <1000 mg/dL.  Urine culture     Status: None   Collection Time: 03/21/15  7:50 PM  Result Value Ref Range   Specimen Description URINE, CLEAN CATCH    Special Requests NONE    Culture NO GROWTH 2 DAYS    Report Status 03/23/2015 FINAL   MRSA PCR Screening     Status: None   Collection Time: 03/21/15 10:51 PM  Result Value Ref Range   MRSA by PCR NEGATIVE NEGATIVE    Comment:        The GeneXpert MRSA Assay (FDA approved for NASAL specimens only), is one component of a comprehensive MRSA colonization surveillance program. It is not intended to diagnose MRSA infection nor to guide or monitor treatment for MRSA infections.   Magnesium     Status: None   Collection Time: 03/21/15 11:10 PM  Result Value Ref Range   Magnesium 1.8 1.7 - 2.4 mg/dL  Phosphorus     Status: None   Collection Time: 03/21/15 11:10 PM  Result Value Ref Range   Phosphorus 3.8 2.5 - 4.6 mg/dL  CG4 I-STAT (Lactic acid)     Status: Abnormal   Collection Time: 03/21/15 11:19 PM  Result Value Ref Range   Lactic Acid, Venous 2.64 (HH) 0.5 - 2.0 mmol/L   Comment NOTIFIED PHYSICIAN   Comprehensive metabolic panel     Status: Abnormal   Collection Time: 03/22/15  1:50 AM  Result Value Ref Range   Sodium 138 135 - 145 mmol/L    Potassium 4.5 3.5 - 5.1 mmol/L   Chloride 107 101 - 111 mmol/L   CO2 25 22 - 32 mmol/L   Glucose, Bld 96 65 - 99 mg/dL   BUN 18 6 - 20 mg/dL   Creatinine, Ser 1.21 0.61 - 1.24 mg/dL   Calcium 8.3 (L) 8.9 - 10.3 mg/dL   Total Protein 4.9 (L) 6.5 - 8.1 g/dL   Albumin 2.8 (L) 3.5 - 5.0 g/dL   AST 17 15 - 41 U/L   ALT 9 (L) 17 - 63 U/L   Alkaline Phosphatase 35 (L) 38 - 126 U/L   Total Bilirubin 0.8 0.3 - 1.2 mg/dL   GFR calc non Af Amer 54 (L) >60 mL/min   GFR calc Af Amer >60 >60 mL/min    Comment: (NOTE) The eGFR has been calculated using the CKD EPI equation. This calculation has not been validated in all clinical situations. eGFR's persistently <60 mL/min signify possible Chronic Kidney Disease.    Anion gap 6 5 - 15  CBC WITH DIFFERENTIAL     Status: Abnormal   Collection Time: 03/22/15  1:50 AM  Result Value Ref Range   WBC 15.7 (H) 4.0 - 10.5 K/uL   RBC 2.91 (L) 4.22 - 5.81 MIL/uL   Hemoglobin 9.8 (L) 13.0 - 17.0 g/dL    Comment: DELTA CHECK NOTED REPEATED TO VERIFY    HCT 29.7 (L) 39.0 - 52.0 %   MCV 102.1 (H) 78.0 - 100.0 fL   MCH 33.7 26.0 - 34.0 pg   MCHC 33.0 30.0 - 36.0 g/dL   RDW 16.5 (H) 11.5 - 15.5 %   Platelets 151 150 - 400 K/uL   Neutrophils Relative % 79 %   Neutro Abs 12.4 (H) 1.7 - 7.7 K/uL   Lymphocytes Relative 11 %   Lymphs Abs 1.7 0.7 - 4.0 K/uL   Monocytes Relative 10 %   Monocytes Absolute 1.6 (H) 0.1 - 1.0 K/uL   Eosinophils Relative 0 %   Eosinophils Absolute 0.0 0.0 - 0.7 K/uL   Basophils Relative 0 %   Basophils Absolute 0.0 0.0 - 0.1 K/uL  Lactic acid, plasma     Status: None   Collection Time: 03/22/15  7:10 AM  Result Value Ref Range   Lactic Acid, Venous 1.0 0.5 - 2.0 mmol/L  Lactic acid, plasma     Status: None   Collection Time: 03/22/15 10:15 AM  Result Value Ref Range   Lactic Acid, Venous 1.2 0.5 - 2.0 mmol/L  Comprehensive metabolic panel     Status: Abnormal   Collection Time: 03/23/15  2:05 AM  Result Value Ref Range    Sodium 134 (L) 135 - 145 mmol/L   Potassium 3.8 3.5 - 5.1 mmol/L   Chloride 102 101 - 111 mmol/L   CO2 24 22 - 32 mmol/L   Glucose, Bld 118 (H) 65 - 99 mg/dL   BUN 21 (H) 6 - 20 mg/dL   Creatinine, Ser 1.22 0.61 - 1.24 mg/dL   Calcium 8.4 (L) 8.9 - 10.3 mg/dL   Total  Protein 5.2 (L) 6.5 - 8.1 g/dL   Albumin 2.8 (L) 3.5 - 5.0 g/dL   AST 17 15 - 41 U/L   ALT 8 (L) 17 - 63 U/L   Alkaline Phosphatase 40 38 - 126 U/L   Total Bilirubin 0.6 0.3 - 1.2 mg/dL   GFR calc non Af Amer 53 (L) >60 mL/min   GFR calc Af Amer >60 >60 mL/min    Comment: (NOTE) The eGFR has been calculated using the CKD EPI equation. This calculation has not been validated in all clinical situations. eGFR's persistently <60 mL/min signify possible Chronic Kidney Disease.    Anion gap 8 5 - 15  CBC WITH DIFFERENTIAL     Status: Abnormal   Collection Time: 03/23/15  2:05 AM  Result Value Ref Range   WBC 12.7 (H) 4.0 - 10.5 K/uL   RBC 2.87 (L) 4.22 - 5.81 MIL/uL   Hemoglobin 9.5 (L) 13.0 - 17.0 g/dL   HCT 29.1 (L) 39.0 - 52.0 %   MCV 101.4 (H) 78.0 - 100.0 fL   MCH 33.1 26.0 - 34.0 pg   MCHC 32.6 30.0 - 36.0 g/dL   RDW 16.3 (H) 11.5 - 15.5 %   Platelets 141 (L) 150 - 400 K/uL   Neutrophils Relative % 81 %   Neutro Abs 10.3 (H) 1.7 - 7.7 K/uL   Lymphocytes Relative 11 %   Lymphs Abs 1.4 0.7 - 4.0 K/uL   Monocytes Relative 8 %   Monocytes Absolute 1.0 0.1 - 1.0 K/uL   Eosinophils Relative 0 %   Eosinophils Absolute 0.0 0.0 - 0.7 K/uL   Basophils Relative 0 %   Basophils Absolute 0.0 0.0 - 0.1 K/uL    MICRO: 11/19 blood cx NGTD IMAGING: Dg Chest 2 View  03/21/2015  CLINICAL DATA:  Fever. Hx HTN. EXAM: CHEST  2 VIEW COMPARISON:  None. FINDINGS: Lateral view degraded by patient arm position. Midline trachea. Mild cardiomegaly with transverse aortic atherosclerosis. No pleural effusion or pneumothorax. Clear lungs. IMPRESSION: Mild cardiomegaly and atherosclerosis.  No acute findings. Electronically Signed    By: Abigail Miyamoto M.D.   On: 03/21/2015 20:36    Assessment/Plan:  Post operative fever concern for non-infectious vs. Infectious etiology. It would be unusual that he has developed acute post operative infection. Wound site looks clean.   - continue with vancomycin and piptazo for now  - recommend to get LP (if possible) to send CSF for analysis given fever, headache,neck pain. I wonder if he may have a chemical meningitis, possibly allergic to material used from surgery?  - not likely to be adrenal insufficiency since no hypotension.  Will continue to provide feedback.  Elzie Rings [facility] [phone]

## 2015-03-23 NOTE — Progress Notes (Signed)
Subjective: Patient reports "It hurts some" (back pain with turning)  Objective: Vital signs in last 24 hours: Temp:  [98.1 F (36.7 C)-103.3 F (39.6 C)] 98.1 F (36.7 C) (11/21 0800) Pulse Rate:  [61-109] 109 (11/21 0930) Resp:  [15-22] 16 (11/21 0800) BP: (86-159)/(46-92) 139/64 mmHg (11/21 0930) SpO2:  [97 %-100 %] 100 % (11/21 0800)  Intake/Output from previous day: 11/20 0701 - 11/21 0700 In: 1700 [P.O.:600; IV Piggyback:1100] Out: 3200 [Urine:3200] Intake/Output this shift: Total I/O In: 360 [P.O.:360] Out: 1050 [Urine:1050]  Conversant & oriented, but dozes throughout conversation. Family present, reports "This isn't his normal" noting somnolence and fatigue. Pt states legs felt weak when up to chair this morning. Presently, exam reveals good strength BLE, flat incision without erythema or drainage beneath intact honeycomb drsg over Dermabond. No dyspnea reported or observed. Pt using Incentive Spirometer often. BM yesterday & today. No belly tenderness.   Lab Results:  Recent Labs  03/22/15 0150 03/23/15 0205  WBC 15.7* 12.7*  HGB 9.8* 9.5*  HCT 29.7* 29.1*  PLT 151 141*   BMET  Recent Labs  03/22/15 0150 03/23/15 0205  NA 138 134*  K 4.5 3.8  CL 107 102  CO2 25 24  GLUCOSE 96 118*  BUN 18 21*  CREATININE 1.21 1.22  CALCIUM 8.3* 8.4*    Studies/Results: Dg Chest 2 View  03/21/2015  CLINICAL DATA:  Fever. Hx HTN. EXAM: CHEST  2 VIEW COMPARISON:  None. FINDINGS: Lateral view degraded by patient arm position. Midline trachea. Mild cardiomegaly with transverse aortic atherosclerosis. No pleural effusion or pneumothorax. Clear lungs. IMPRESSION: Mild cardiomegaly and atherosclerosis.  No acute findings. Electronically Signed   By: Abigail Miyamoto M.D.   On: 03/21/2015 20:36    Assessment/Plan:   LOS: 2 days  Awaiting culture results. Dr. Vertell Limber requests ID consult. Spoke with Dr. Graylon Good who agrees to visit.    Verdis Prime 03/23/2015, 12:23  PM

## 2015-03-24 LAB — CBC WITH DIFFERENTIAL/PLATELET
Basophils Absolute: 0 10*3/uL (ref 0.0–0.1)
Basophils Relative: 0 %
EOS ABS: 0.1 10*3/uL (ref 0.0–0.7)
EOS PCT: 0 %
HCT: 32.3 % — ABNORMAL LOW (ref 39.0–52.0)
HEMOGLOBIN: 10.6 g/dL — AB (ref 13.0–17.0)
LYMPHS ABS: 1.5 10*3/uL (ref 0.7–4.0)
LYMPHS PCT: 12 %
MCH: 33.1 pg (ref 26.0–34.0)
MCHC: 32.8 g/dL (ref 30.0–36.0)
MCV: 100.9 fL — AB (ref 78.0–100.0)
MONOS PCT: 9 %
Monocytes Absolute: 1 10*3/uL (ref 0.1–1.0)
NEUTROS PCT: 79 %
Neutro Abs: 9.6 10*3/uL — ABNORMAL HIGH (ref 1.7–7.7)
Platelets: 184 10*3/uL (ref 150–400)
RBC: 3.2 MIL/uL — ABNORMAL LOW (ref 4.22–5.81)
RDW: 15.7 % — ABNORMAL HIGH (ref 11.5–15.5)
WBC: 12.1 10*3/uL — ABNORMAL HIGH (ref 4.0–10.5)

## 2015-03-24 LAB — COMPREHENSIVE METABOLIC PANEL
ALK PHOS: 45 U/L (ref 38–126)
ALT: 15 U/L — ABNORMAL LOW (ref 17–63)
ANION GAP: 8 (ref 5–15)
AST: 24 U/L (ref 15–41)
Albumin: 3.2 g/dL — ABNORMAL LOW (ref 3.5–5.0)
BUN: 15 mg/dL (ref 6–20)
CALCIUM: 9.1 mg/dL (ref 8.9–10.3)
CO2: 25 mmol/L (ref 22–32)
Chloride: 102 mmol/L (ref 101–111)
Creatinine, Ser: 0.96 mg/dL (ref 0.61–1.24)
Glucose, Bld: 103 mg/dL — ABNORMAL HIGH (ref 65–99)
Potassium: 4.1 mmol/L (ref 3.5–5.1)
SODIUM: 135 mmol/L (ref 135–145)
TOTAL PROTEIN: 5.9 g/dL — AB (ref 6.5–8.1)
Total Bilirubin: 1 mg/dL (ref 0.3–1.2)

## 2015-03-24 NOTE — Progress Notes (Signed)
I appreciate Dr. Storm Frisk input.  I think that an LP would be ill-advised at this time, given that the patient has had an extensive laminectomy and I would be concerned about potentially creating a CSF leak.  I would favor continuing empiric therapy, rather than proceeding with an LP at this time.

## 2015-03-24 NOTE — Progress Notes (Signed)
Subjective: Patient reports "I'm still just weak. I got up to the bedside commode this morning and my legs just tremble"  Objective: Vital signs in last 24 hours: Temp:  [98.2 F (36.8 C)-99.4 F (37.4 C)] 98.2 F (36.8 C) (11/22 0752) Pulse Rate:  [73-113] 100 (11/22 0752) Resp:  [13-19] 13 (11/22 0752) BP: (117-151)/(64-107) 131/107 mmHg (11/22 0752) SpO2:  [92 %-97 %] 94 % (11/22 0400) Weight:  [88.86 kg (195 lb 14.4 oz)] 88.86 kg (195 lb 14.4 oz) (11/22 0500)  Intake/Output from previous day: 11/21 0701 - 11/22 0700 In: 720 [P.O.:720] Out: 2050 [Urine:2050] Intake/Output this shift: Total I/O In: -  Out: 200 [Urine:200]  Alert, conversant, oriented. More alert this morning than with yesterday's visit. MAEW. Denies headache, neck pain. No nausea or vomiting this morning. Reports lumbar pain with movemnet in bed. Site remains flat, without erythema, swelling, or drainage beneath honeycomb drsg.   Lab Results:  Recent Labs  03/23/15 0205 03/24/15 0436  WBC 12.7* 12.1*  HGB 9.5* 10.6*  HCT 29.1* 32.3*  PLT 141* 184   BMET  Recent Labs  03/23/15 0205 03/24/15 0224  NA 134* 135  K 3.8 4.1  CL 102 102  CO2 24 25  GLUCOSE 118* 103*  BUN 21* 15  CREATININE 1.22 0.96  CALCIUM 8.4* 9.1    Studies/Results: No results found.  Assessment/Plan:   LOS: 3 days  Continue to mobilize as tolerated. Will d/w DrStern possible transfer to floor today.  LP is not planned at present due to associated risks.   Verdis Prime 03/24/2015, 8:38 AM

## 2015-03-24 NOTE — Consult Note (Signed)
[facility] Medical Consultation  Kevin Carlson. [id number] DOB: 10/05/1929 DOA: 03/21/2015 PCP: Kennieth Rad, MD   Requesting physician: Daiva Nakayama (neurosurgery) Date of consultation: 03/22/2015 Reason for consultation: Medical management  Impression/Recommendations Principal Problem:   Sepsis due to undetermined organism [facility]) Active Problems:   Hypertension   Hyperlipidemia   Atrial fibrillation (HCC)   GERD (gastroesophageal reflux disease)   Polymyalgia rheumatica (HCC)   Chronic constipation   Postoperative anemia due to acute blood loss  Sepsis due to undetermined organism ([place]) - No obvious source of infection at this time. WBC and lactic acid both elevated on admission with fever to 10 5F. - Continue IV vancomycin and Zosyn. Follow-up blood and urine cultures. Chest x-ray negative. - Continue IV fluids.  Hypertension - Continue amlodipine, benazepril and metoprolol. - Blood pressures 96-147/58-81  Hyperlipidemia - Continue simvastatin 40 mg by mouth daily. - Monitor LFTs periodically.  Atrial fibrillation ([place]) - EKG on admission showed atrial fibrillation with a rate of 71. No old EKGs for comparison. - Continue metoprolol for rate control. Not on chronic anticoagulation. Followed by Dr. Alroy Dust of cardiology [facility]) who deferred. - F/U echocardiogram. Had a heart cath around 4 years ago, and did not have any blockages per patient.  OSA? -Patient with signs and symptoms consistent with OSA but has never been tested -CPAP per respiratory therapist   GERD (gastroesophageal reflux disease) - Continue Pepcid, also on Protonix.   Polymyalgia rheumatica (HCC) - Continue prednisone 5 mg by mouth daily.  Acute anemia secondary to operative blood loss - Hemoglobin was 13 preoperatively. Monitor hematocrit and hemoglobin.  Chronic constipation - Continue MiraLAX as needed.      Will followup again tomorrow. Please contact me if I  can be of assistance in the meanwhile. Thank you for this consultation.  Chief Complaint: Multiple medical problems  HPI:  Kevin Carlson. is an 81 y.o. WM PMHx  hypertension, osteopenia, arthritis, hyperlipidemia, GERD, polymyalgia rheumatica, chronic constipation secondary to opiates, anemia, and spinal stenosis   Underwent lumbar decompression 03/20/15 and was discharged home 03/21/15, but returned to the ED due to fever of more than 105F and confusion as per family members. Admitted by neurosurgeon. IM asked to consult for assistance with management.  11/22 A/O 4, NAD. Patient's family states there are periods when patient is sleeping on his back where he stops breathing. States has never been tested for sleep apnea   Review of symptoms The patient denies anorexia, fever, weight loss,, vision loss, decreased hearing, hoarseness, chest pain, syncope, dyspnea on exertion, peripheral edema, balance deficits, hemoptysis, abdominal pain, melena, hematochezia, severe indigestion/heartburn, hematuria, incontinence, genital sores, muscle weakness, suspicious skin lesions, transient blindness, difficulty walking, depression, unusual weight change, abnormal bleeding, enlarged lymph nodes, angioedema, and breast masses.   Consultants:   Procedure/Significant Events:    Culture    Antibiotics: Zosyn 11/20>>   DVT prophylaxis: SCD   Past Medical History  Diagnosis Date  . Hypertension   . Osteopenia   . Arthritis   . Hyperlipemia   . GERD (gastroesophageal reflux disease)   . DDD (degenerative disc disease), cervical   . Polymyalgia rheumatica ([place])   . Constipation due to opioid therapy   . Nasal congestion   . Anemia   . Carpal tunnel syndrome   . Neuropathy ([place])   . Dysrhythmia   . Cancer ([place])     Skin Cancer-   . History of hiatal hernia   . BPH (benign prostatic hyperplasia)   .  Complication of anesthesia 2011    " bleeding from Esophagus after extubated, had  to have an endo   Past Surgical History  Procedure Laterality Date  . Hernia repair Right   . Colonoscopy w/ polypectomy    . Replacement total knee Left 2011  . Replacement total knee Right 2006  . Knee surgery Right 1974    "cleaned it out"  . Eye surgery Bilateral     cataract  . Finger surgery Right     index- pinned  . Appendectomy  1951  . Colon resection  1951    after appendectomy  . Tonsillectomy    . Hernia repair Right 1948  . Lumbar laminectomy/decompression microdiscectomy N/A 03/20/2015    Procedure: Decompressive lumbar laminectomy L3-L5 ;  Surgeon: Erline Levine, MD;  Location: [place] NEURO ORS;  Service: Neurosurgery;  Laterality: N/A;   Social History:  reports that he quit smoking about 40 years ago. He does not have any smokeless tobacco history on file. He reports that he does not drink alcohol or use illicit drugs.  Allergies  Allergen Reactions  . Codeine Other (See Comments)    fidgety   History reviewed. No pertinent family history.  Prior to Admission medications   Medication Sig Start Date End Date Taking? Authorizing Provider  amLODipine-benazepril (LOTREL) 5-10 MG capsule Take 1 capsule by mouth daily.   Yes Historical Provider, MD  gabapentin (NEURONTIN) 600 MG tablet Take 600 mg by mouth 3 (three) times daily.    Yes Historical Provider, MD  HYDROcodone-acetaminophen (NORCO) 10-325 MG tablet Take 1 tablet by mouth every 6 (six) hours as needed for moderate pain.   Yes Historical Provider, MD  lansoprazole (PREVACID) 30 MG capsule Take 30 mg by mouth daily.    Yes Historical Provider, MD  metoprolol tartrate (LOPRESSOR) 25 MG tablet Take 25 mg by mouth daily.    Yes Historical Provider, MD  Multiple Vitamin (MULTIVITAMIN WITH MINERALS) TABS tablet Take 1 tablet by mouth daily.   Yes Historical Provider, MD  polyethylene glycol (MIRALAX / GLYCOLAX) packet Take 17 g by mouth daily as needed (constipation).    Yes Historical Provider, MD  predniSONE  (DELTASONE) 5 MG tablet Take 5 mg by mouth daily with breakfast.   Yes Historical Provider, MD  simvastatin (ZOCOR) 40 MG tablet Take 40 mg by mouth daily.   Yes Historical Provider, MD  tamsulosin (FLOMAX) 0.4 MG CAPS capsule Take 0.4 mg by mouth daily.    Yes Historical Provider, MD  tiZANidine (ZANAFLEX) 4 MG capsule Take 4 mg by mouth at bedtime.   Yes Historical Provider, MD   Physical Exam: Blood pressure 140/65, pulse 91, temperature 98.3 F (36.8 C), temperature source Oral, resp. rate 17, height 6\' 2"  (1.88 m), weight 88.86 kg (195 lb 14.4 oz), SpO2 100 %. Filed Vitals:   03/24/15 0752 03/24/15 1153 03/24/15 1252 03/24/15 1739  BP: 131/107 139/95 150/64 140/65  Pulse: 100  86 91  Temp: 98.2 F (36.8 C) 98.6 F (37 C) 98.3 F (36.8 C) 98.3 F (36.8 C)  TempSrc: Oral Oral Oral Oral  Resp: 13 19 16 17   Height:      Weight:      SpO2:   100% 100%     General: A/O 4, NAD, No acute respiratory distress Eyes: Negative headache, eye pain, double vision,negative scleral hemorrhage ENT: Negative Runny nose, negative ear pain, negative gingival bleeding, Neck:  Negative scars, masses, torticollis, lymphadenopathy, JVD Lungs: Clear to auscultation bilaterally without  wheezes or crackles Cardiovascular: Regular rate and rhythm without murmur gallop or rub normal S1 and S2 Abdomen:negative abdominal pain, nondistended, positive soft, bowel sounds, no rebound, no ascites, no appreciable mass   Labs on Admission:  Basic Metabolic Panel:  Recent Labs Lab 03/21/15 1844 03/21/15 2310 03/22/15 0150 03/23/15 0205 03/24/15 0224  NA 136  --  138 134* 135  K 4.4  --  4.5 3.8 4.1  CL 100*  --  107 102 102  CO2 27  --  25 24 25   GLUCOSE 123*  --  96 118* 103*  BUN 16  --  18 21* 15  CREATININE 1.20  --  1.21 1.22 0.96  CALCIUM 9.6  --  8.3* 8.4* 9.1  MG  --  1.8  --   --   --   PHOS  --  3.8  --   --   --    Liver Function Tests:  Recent Labs Lab 03/21/15 1844  03/22/15 0150 03/23/15 0205 03/24/15 0224  AST 24 17 17 24   ALT 12* 9* 8* 15*  ALKPHOS 45 35* 40 45  BILITOT 0.8 0.8 0.6 1.0  PROT 6.4* 4.9* 5.2* 5.9*  ALBUMIN 3.8 2.8* 2.8* 3.2*   No results for input(s): LIPASE, AMYLASE in the last 168 hours. No results for input(s): AMMONIA in the last 168 hours. CBC:  Recent Labs Lab 03/21/15 1844 03/22/15 0150 03/23/15 0205 03/24/15 0436  WBC 18.3* 15.7* 12.7* 12.1*  NEUTROABS 15.1* 12.4* 10.3* 9.6*  HGB 12.4* 9.8* 9.5* 10.6*  HCT 37.7* 29.7* 29.1* 32.3*  MCV 101.3* 102.1* 101.4* 100.9*  PLT 179 151 141* 184   Cardiac Enzymes: No results for input(s): CKTOTAL, CKMB, CKMBINDEX, TROPONINI in the last 168 hours. BNP: Invalid input(s): POCBNP CBG: No results for input(s): GLUCAP in the last 168 hours.  Radiological Exams on Admission: No results found.  EKG: Independently reviewed.   Care during the described time interval was provided by me .  I have reviewed this patient's available data, including medical history, events of note, physical examination, and all test results as part of my evaluation. I have personally reviewed and interpreted all radiology studies.  Time spent: 15 minutes  Allie Bossier [facility] Pager [phone]  If 7PM-7AM, please contact night-coverage [email or web address] Password [facility] 03/24/2015, 7:33 PM

## 2015-03-24 NOTE — Progress Notes (Signed)
[place] for Infectious Disease    Date of Admission:  03/21/2015   Total days of antibiotics 4        Day 4 vanco/piptazo           ID: Kevin Carlson. is a 81 y.o. male with post op fevers  Principal Problem:   Sepsis due to undetermined organism [facility]) Active Problems:   Hypertension   Hyperlipidemia   Atrial fibrillation (HCC)   GERD (gastroesophageal reflux disease)   Polymyalgia rheumatica (HCC)   Chronic constipation   Postoperative anemia due to acute blood loss    Subjective: Afebrile, still feeling weak but improved from the day prior  Medications:  . amLODipine  5 mg Oral Daily   And  . benazepril  10 mg Oral Daily  . atorvastatin  20 mg Oral q1800  . gabapentin  600 mg Oral BID  . metoprolol tartrate  25 mg Oral Daily  . pantoprazole  20 mg Oral Daily  . piperacillin-tazobactam (ZOSYN)  IV  3.375 g Intravenous 3 times per day  . predniSONE  5 mg Oral Q breakfast  . tamsulosin  0.4 mg Oral Daily  . tiZANidine  4 mg Oral QHS  . vancomycin  1,000 mg Intravenous Q12H    Objective: Vital signs in last 24 hours: Temp:  [98.2 F (36.8 C)-98.7 F (37.1 C)] 98.3 F (36.8 C) (11/22 1252) Pulse Rate:  [86-100] 86 (11/22 1252) Resp:  [13-19] 16 (11/22 1252) BP: (131-151)/(64-107) 150/64 mmHg (11/22 1252) SpO2:  [94 %-100 %] 100 % (11/22 1252) Weight:  [195 lb 14.4 oz (88.86 kg)] 195 lb 14.4 oz (88.86 kg) (11/22 0500) Physical Exam  Constitutional: He is oriented to person, place, and time. He appears well-developed and well-nourished. No distress. Sitting on the commode HENT:  Mouth/Throat: Oropharynx is clear and moist. No oropharyngeal exudate.  Cardiovascular: Normal rate, regular rhythm and normal heart sounds. Exam reveals no gallop and no friction rub.  No murmur heard.  Pulmonary/Chest: Effort normal and breath sounds normal. No respiratory distress. He has no wheezes.  Abdominal: Soft. Bowel sounds are normal. He exhibits no distension.  There is no tenderness.  Lymphadenopathy:  He has no cervical adenopathy.  Neurological: He is alert and oriented to person, place, and time.  Skin: Skin is warm and dry. No rash noted. No erythema.  Psychiatric: He has a normal mood and affect. His behavior is normal.     Lab Results  Recent Labs  03/23/15 0205 03/24/15 0224 03/24/15 0436  WBC 12.7*  --  12.1*  HGB 9.5*  --  10.6*  HCT 29.1*  --  32.3*  NA 134* 135  --   K 3.8 4.1  --   CL 102 102  --   CO2 24 25  --   BUN 21* 15  --   CREATININE 1.22 0.96  --    Liver Panel  Recent Labs  03/23/15 0205 03/24/15 0224  PROT 5.2* 5.9*  ALBUMIN 2.8* 3.2*  AST 17 24  ALT 8* 15*  ALKPHOS 40 45  BILITOT 0.6 1.0    Microbiology: 11/19 blood cx ngtd  Assessment/Plan: Post operative fevers = appears to be slowly improving. He is not MRSA colonized. Recommend to discontinue vancomycin at this point since MRSA has not been isolated. Will continue to monitor for his improvement.   Baxter Flattery [facility] for Infectious Diseases Cell: [phone] Pager: [phone]  03/24/2015, 4:34 PM

## 2015-03-24 NOTE — Care Management Important Message (Signed)
Important Message  Patient Details  Name: Kevin Carlson. MRN: [id number] Date of Birth: [birth year]   Medicare Important Message Given:  Yes    Coline Calkin Abena 03/24/2015, 2:00 PM

## 2015-03-24 NOTE — Progress Notes (Signed)
Patient does not wish to wear CPAP tonight. RT will continue to monitor.  

## 2015-03-24 NOTE — Progress Notes (Signed)
NT asked patient to walk in hall, but he refused; he has only walked to bathroom several times.  I instructed him that he needs to walk in the hallways per MD order this evening and will encourage again.

## 2015-03-25 DIAGNOSIS — I10 Essential (primary) hypertension: Secondary | ICD-10-CM

## 2015-03-25 DIAGNOSIS — A419 Sepsis, unspecified organism: Secondary | ICD-10-CM

## 2015-03-25 DIAGNOSIS — R5082 Postprocedural fever: Secondary | ICD-10-CM | POA: Insufficient documentation

## 2015-03-25 DIAGNOSIS — M542 Cervicalgia: Secondary | ICD-10-CM | POA: Insufficient documentation

## 2015-03-25 DIAGNOSIS — G44009 Cluster headache syndrome, unspecified, not intractable: Secondary | ICD-10-CM | POA: Insufficient documentation

## 2015-03-25 NOTE — Progress Notes (Addendum)
PT Note  Patient Details Name: Kevin Carlson. MRN: [id number] DOB: 07/11/1929   Cancelled Treatment:    Reason Eval/Treat Not Completed: PT screened, no needs identified, will sign off   Noted discharge order written. Talked to patient and daughter and they have no mobility concerns. Report he has been getting in/out of bed and walking fine. Does not need HHPT or any DME.    Clayden Withem 03/25/2015, 12:07 PM Pager [phone]

## 2015-03-25 NOTE — Care Management Note (Signed)
Case Management Note  Patient Details  Name: Kevin Carlson. MRN: [id number] Date of Birth: 1929-12-17  Subjective/Objective:                    Action/Plan: Patient being discharged home today with self care. No home needs per MD. No further needs per CM.   Expected Discharge Date:                  Expected Discharge Plan:  Home/Self Care  In-House Referral:     Discharge planning Services     Post Acute Care Choice:    Choice offered to:     DME Arranged:    DME Agency:     HH Arranged:    [place] Agency:     Status of Service:  Completed, signed off  Medicare Important Message Given:  Yes Date Medicare IM Given:    Medicare IM give by:    Date Additional Medicare IM Given:    Additional Medicare Important Message give by:     If discussed at [place] of Stay Meetings, dates discussed:    Additional Comments:  Pollie Friar, RN 03/25/2015, 11:14 AM

## 2015-03-25 NOTE — Progress Notes (Addendum)
    [place] for Infectious Disease    Date of Admission:  03/21/2015   Total days of antibiotics 5        Day 5 vanco/piptazo           ID: Kevin Carlson. is a 81 y.o. male with post op fevers  Principal Problem:   Sepsis due to undetermined organism [facility]) Active Problems:   Hypertension   Hyperlipidemia   Atrial fibrillation (HCC)   GERD (gastroesophageal reflux disease)   Polymyalgia rheumatica (HCC)   Chronic constipation   Postoperative anemia due to acute blood loss    Subjective: Afebrile, feels much improved from yesterday. Walked down hall with walker  Medications:  . amLODipine  5 mg Oral Daily   And  . benazepril  10 mg Oral Daily  . atorvastatin  20 mg Oral q1800  . gabapentin  600 mg Oral BID  . metoprolol tartrate  25 mg Oral Daily  . pantoprazole  20 mg Oral Daily  . predniSONE  5 mg Oral Q breakfast  . tamsulosin  0.4 mg Oral Daily  . tiZANidine  4 mg Oral QHS    Objective: Vital signs in last 24 hours: Temp:  [98.1 F (36.7 C)-99.1 F (37.3 C)] 98.1 F (36.7 C) (11/23 0535) Pulse Rate:  [70-91] 70 (11/23 0535) Resp:  [16-19] 18 (11/23 0535) BP: (106-150)/(54-95) 124/66 mmHg (11/23 0535) SpO2:  [98 %-100 %] 99 % (11/23 0535) Physical Exam  Constitutional: He is oriented to person, place, and time. He appears well-developed and well-nourished. No distress.  HENT:  Mouth/Throat: Oropharynx is clear and moist. No oropharyngeal exudate.  Cardiovascular: Normal rate, regular rhythm and normal heart sounds. Exam reveals no gallop and no friction rub.  No murmur heard.  Pulmonary/Chest: Effort normal and breath sounds normal. No respiratory distress. He has no wheezes.  Neurological: He is alert and oriented to person, place, and time.  Skin: Skin is warm and dry. No rash noted. No erythema.  Psychiatric: He has a normal mood and affect. His behavior is normal.     Lab Results  Recent Labs  03/23/15 0205 03/24/15 0224 03/24/15 0436   WBC 12.7*  --  12.1*  HGB 9.5*  --  10.6*  HCT 29.1*  --  32.3*  NA 134* 135  --   K 3.8 4.1  --   CL 102 102  --   CO2 24 25  --   BUN 21* 15  --   CREATININE 1.22 0.96  --    Liver Panel  Recent Labs  03/23/15 0205 03/24/15 0224  PROT 5.2* 5.9*  ALBUMIN 2.8* 3.2*  AST 17 24  ALT 8* 15*  ALKPHOS 40 45  BILITOT 0.6 1.0    Microbiology: 11/19 blood cx ngtd  Assessment/Plan: Post operative fevers = no longer afebrile since admit. No positive cultures. Recommend to discontinue all antibiotics and have him follow up with Dr. Vertell Limber as previously planned.   Baxter Flattery [facility] for Infectious Diseases Cell: [phone] Pager: [phone]  03/25/2015, 9:15 AM

## 2015-03-25 NOTE — Progress Notes (Signed)
Subjective: Patient reports feels better  Objective: Vital signs in last 24 hours: Temp:  [98.1 F (36.7 C)-99.1 F (37.3 C)] 98.1 F (36.7 C) (11/23 0535) Pulse Rate:  [70-91] 70 (11/23 0535) Resp:  [16-19] 18 (11/23 0535) BP: (106-150)/(54-95) 124/66 mmHg (11/23 0535) SpO2:  [98 %-100 %] 99 % (11/23 0535)  Intake/Output from previous day: 11/22 0701 - 11/23 0700 In: -  Out: 1625 [Urine:1625] Intake/Output this shift:    Physical Exam: Strength full, denies stiff neck or significant pain.  Nausea resolved.  Ambulating without difficulty.  Lab Results:  Recent Labs  03/23/15 0205 03/24/15 0436  WBC 12.7* 12.1*  HGB 9.5* 10.6*  HCT 29.1* 32.3*  PLT 141* 184   BMET  Recent Labs  03/23/15 0205 03/24/15 0224  NA 134* 135  K 3.8 4.1  CL 102 102  CO2 24 25  GLUCOSE 118* 103*  BUN 21* 15  CREATININE 1.22 0.96  CALCIUM 8.4* 9.1    Studies/Results: No results found.  Assessment/Plan: Doing well.  WBC has trended down.  ?D/C abx or change to po ABX.  Will defer to IM/ID.  OK from my standpoint to D/C home.  He needs on pain medication prescriptions.    LOS: 4 days    [name], MD 03/25/2015, 8:39 AM

## 2015-03-25 NOTE — Progress Notes (Signed)
Patient discharged home with wife. Patient and wife vocalized understanding of discharge instructions including admission dx, no new medications, follow up appointment. IV removed. Patient given discharge instructions. Guest services to escort patient by wheelchair to wife's car. Will continue to monitor until then.

## 2015-03-25 NOTE — Discharge Instructions (Signed)
Post-Injection Inflammatory Reaction An inflammatory reaction is possible any time a needle is used to give an injection. It is called a post-injection inflammatory reaction because it happens after the needle is put through the skin. A reaction may start minutes after the injection was given, or the reaction may appear several hours after the injection was given. A reaction can last for several hours to several days. CAUSES  An injection reaction can be caused by different things. Possible causes include:  A reaction to the medicine or vaccine that was given.  An infection that occurs if germs get inside the body at the injection site. SYMPTOMS   Some symptoms may be found only at the injection site (localized reaction). These symptoms may include:  Itching.  Redness.  Warmth.  Swelling.  Tenderness.  Pain.  Some symptoms may show up in other parts of the body (systemic reaction). These symptoms may include:  Fever or chills.  Muscle aches.  Nausea.  Headache.  Dizziness. DIAGNOSIS  To determine if there is a post-injection inflammatory reaction, your caregiver may:  Do a physical exam.  Draw a circle around any redness near the injection site. This will help to show whether the redness is spreading. TREATMENT  Treatment will depend on what caused the reaction. Treatment will also vary based on how severe your reaction is. Common treatment methods include:  Putting an ice pack over the injection site.  Taking anti-inflammatory medicine, to reduce swelling and itching.  Taking an antibiotic.  Taking pain medicine. HOME CARE INSTRUCTIONS   Follow all your caregiver's instructions carefully.  Keep the injection site clean.  You may put ice on the injection site.  Put ice in a plastic bag.  Place a towel between your skin and the bag.  Leave the ice on for 15-20 minutes, 03-04 times a day.  If the reaction is in a joint, you might need to rest the joint  for a while. Ask your caregiver how active you can be.  Only take over-the-counter or prescription medicines for pain, fever, or discomfort as directed by your caregiver. Do not give aspirin to children. SEEK MEDICAL CARE IF:   You have any questions about your medicines.  Your pain, redness, warmth, swelling, or itching lasts for several hours.  You have a fever, chills, or muscle aches. SEEK IMMEDIATE MEDICAL CARE IF:  Your pain, swelling, itching, or redness gets worse.  You have trouble breathing.  Your child has a high-pitched cry or does not stop crying.   This information is not intended to replace advice given to you by your health care provider. Make sure you discuss any questions you have with your health care provider.   Document Released: 12/29/2010 Document Revised: 07/11/2011 Document Reviewed: 10/29/2014 Elsevier Interactive Patient Education [employer].

## 2015-03-25 NOTE — Progress Notes (Signed)
[facility] PROGRESS NOTE    Progress Note   Kevin Carlson. [id number] DOB: 21-Feb-1930 DOA: 03/21/2015 PCP: Kennieth Rad, MD   Brief Narrative:   Kevin Carlson. is an 81 y.o. male past medical history of essential hypertension and GERD and polymyalgia rheumatica chronic constipation due to steroids and spinal stenosis underwent decompression on 03/20/2015 and discharge on 03/21/2015 return to the hospital due to fever of 102 and confusion.  Assessment/Plan:   Principal Problem:   Sepsis due to undetermined organism [facility]) Active Problems:   Hypertension   Hyperlipidemia   Atrial fibrillation (HCC)   GERD (gastroesophageal reflux disease)   Polymyalgia rheumatica (HCC)   Chronic constipation   Postoperative anemia due to acute blood loss  Started empirically on vancomycin and Zosyn on 03/21/2015, blood cultures urine cultures have remained negative she has remained MRSA too. ID was consulted and he also agreed to discontinue antibiotics, her chest x-ray was negative . Lactate and fever both resolved  Discuss with ID over the phone and I agree with than that we probably DC her antibiotics and discharge him home. Able to be d/c today.     DVT Prophylaxis - Lovenox ordered.  Family Communication: none Disposition Plan: Home when stable. Code Status:     Code Status Orders        Start     Ordered   03/21/15 2251  Full code   Continuous     03/21/15 2250        IV Access:    Peripheral IV   Procedures and diagnostic studies:   No results found.   Medical Consultants:    None.  Anti-Infectives:   Anti-infectives    Start     Dose/Rate Route Frequency Ordered Stop   03/22/15 0700  vancomycin (VANCOCIN) IVPB 1000 mg/200 mL premix  Status:  Discontinued     1,000 mg 200 mL/hr over 60 Minutes Intravenous Every 12 hours 03/21/15 1859 03/24/15 1637   03/22/15 0300  piperacillin-tazobactam (ZOSYN) IVPB 3.375 g  Status:  Discontinued       3.375 g 12.5 mL/hr over 240 Minutes Intravenous 3 times per day 03/21/15 1859 03/25/15 0913   03/21/15 1900  piperacillin-tazobactam (ZOSYN) IVPB 3.375 g     3.375 g 100 mL/hr over 30 Minutes Intravenous  Once 03/21/15 1852 03/21/15 2039   03/21/15 1900  vancomycin (VANCOCIN) IVPB 1000 mg/200 mL premix     1,000 mg 200 mL/hr over 60 Minutes Intravenous  Once 03/21/15 1852 03/21/15 2109      Subjective:    Kevin Carlson. no complains.  Objective:    Filed Vitals:   03/24/15 1739 03/24/15 2123 03/25/15 0109 03/25/15 0535  BP: 140/65 125/54 106/60 124/66  Pulse: 91 86 86 70  Temp: 98.3 F (36.8 C) 98.6 F (37 C) 99.1 F (37.3 C) 98.1 F (36.7 C)  TempSrc: Oral Oral Oral Oral  Resp: 17 18 18 18   Height:      Weight:      SpO2: 100% 98% 98% 99%    Intake/Output Summary (Last 24 hours) at 03/25/15 0916 Last data filed at 03/25/15 0552  Gross per 24 hour  Intake      0 ml  Output   1425 ml  Net  -1425 ml   Filed Weights   03/21/15 2237 03/24/15 0500  Weight: 88.542 kg (195 lb 3.2 oz) 88.86 kg (195 lb 14.4 oz)    Exam: Gen:  NAD Cardiovascular:  RRR, No M/R/G Chest and lungs:   CTAB Abdomen:  Abdomen soft, NT/ND, + BS Extremities:  No C/E/C   Data Reviewed:    Labs: Basic Metabolic Panel:  Recent Labs Lab 03/21/15 1844 03/21/15 2310 03/22/15 0150 03/23/15 0205 03/24/15 0224  NA 136  --  138 134* 135  K 4.4  --  4.5 3.8 4.1  CL 100*  --  107 102 102  CO2 27  --  25 24 25   GLUCOSE 123*  --  96 118* 103*  BUN 16  --  18 21* 15  CREATININE 1.20  --  1.21 1.22 0.96  CALCIUM 9.6  --  8.3* 8.4* 9.1  MG  --  1.8  --   --   --   PHOS  --  3.8  --   --   --    GFR Estimated Creatinine Clearance: 67.8 mL/min (by C-G formula based on Cr of 0.96). Liver Function Tests:  Recent Labs Lab 03/21/15 1844 03/22/15 0150 03/23/15 0205 03/24/15 0224  AST 24 17 17 24   ALT 12* 9* 8* 15*  ALKPHOS 45 35* 40 45  BILITOT 0.8 0.8 0.6 1.0  PROT 6.4* 4.9*  5.2* 5.9*  ALBUMIN 3.8 2.8* 2.8* 3.2*   No results for input(s): LIPASE, AMYLASE in the last 168 hours. No results for input(s): AMMONIA in the last 168 hours. Coagulation profile No results for input(s): INR, PROTIME in the last 168 hours.  CBC:  Recent Labs Lab 03/21/15 1844 03/22/15 0150 03/23/15 0205 03/24/15 0436  WBC 18.3* 15.7* 12.7* 12.1*  NEUTROABS 15.1* 12.4* 10.3* 9.6*  HGB 12.4* 9.8* 9.5* 10.6*  HCT 37.7* 29.7* 29.1* 32.3*  MCV 101.3* 102.1* 101.4* 100.9*  PLT 179 151 141* 184   Cardiac Enzymes: No results for input(s): CKTOTAL, CKMB, CKMBINDEX, TROPONINI in the last 168 hours. BNP (last 3 results) No results for input(s): PROBNP in the last 8760 hours. CBG: No results for input(s): GLUCAP in the last 168 hours. D-Dimer: No results for input(s): DDIMER in the last 72 hours. Hgb A1c: No results for input(s): HGBA1C in the last 72 hours. Lipid Profile: No results for input(s): CHOL, HDL, LDLCALC, TRIG, CHOLHDL, LDLDIRECT in the last 72 hours. Thyroid function studies: No results for input(s): TSH, T4TOTAL, T3FREE, THYROIDAB in the last 72 hours.  Invalid input(s): FREET3 Anemia work up: No results for input(s): VITAMINB12, FOLATE, FERRITIN, TIBC, IRON, RETICCTPCT in the last 72 hours. Sepsis Labs:  Recent Labs Lab 03/21/15 1844 03/21/15 1858 03/21/15 2319 03/22/15 0150 03/22/15 0710 03/22/15 1015 03/23/15 0205 03/24/15 0436  WBC 18.3*  --   --  15.7*  --   --  12.7* 12.1*  LATICACIDVEN  --  0.99 2.64*  --  1.0 1.2  --   --    Microbiology Recent Results (from the past 240 hour(s))  Surgical pcr screen     Status: None   Collection Time: 03/17/15  3:03 PM  Result Value Ref Range Status   MRSA, PCR NEGATIVE NEGATIVE Final   Staphylococcus aureus NEGATIVE NEGATIVE Final    Comment:        The Xpert SA Assay (FDA approved for NASAL specimens in patients over 15 years of age), is one component of a comprehensive surveillance program.  Test  performance has been validated by [facility] for patients greater than or equal to 34 year old. It is not intended to diagnose infection nor to guide or monitor treatment.   Culture, blood (routine  x 2)     Status: None (Preliminary result)   Collection Time: 03/21/15  6:45 PM  Result Value Ref Range Status   Specimen Description BLOOD RIGHT ARM  Final   Special Requests BOTTLES DRAWN AEROBIC AND ANAEROBIC 10ML  Final   Culture NO GROWTH 3 DAYS  Final   Report Status PENDING  Incomplete  Culture, blood (routine x 2)     Status: None (Preliminary result)   Collection Time: 03/21/15  7:07 PM  Result Value Ref Range Status   Specimen Description BLOOD LEFT ARM  Final   Special Requests BOTTLES DRAWN AEROBIC AND ANAEROBIC 10ML  Final   Culture NO GROWTH 3 DAYS  Final   Report Status PENDING  Incomplete  Urine culture     Status: None   Collection Time: 03/21/15  7:50 PM  Result Value Ref Range Status   Specimen Description URINE, CLEAN CATCH  Final   Special Requests NONE  Final   Culture NO GROWTH 2 DAYS  Final   Report Status 03/23/2015 FINAL  Final  MRSA PCR Screening     Status: None   Collection Time: 03/21/15 10:51 PM  Result Value Ref Range Status   MRSA by PCR NEGATIVE NEGATIVE Final    Comment:        The GeneXpert MRSA Assay (FDA approved for NASAL specimens only), is one component of a comprehensive MRSA colonization surveillance program. It is not intended to diagnose MRSA infection nor to guide or monitor treatment for MRSA infections.      Medications:   . amLODipine  5 mg Oral Daily   And  . benazepril  10 mg Oral Daily  . atorvastatin  20 mg Oral q1800  . gabapentin  600 mg Oral BID  . metoprolol tartrate  25 mg Oral Daily  . pantoprazole  20 mg Oral Daily  . predniSONE  5 mg Oral Q breakfast  . tamsulosin  0.4 mg Oral Daily  . tiZANidine  4 mg Oral QHS   Continuous Infusions:   Time spent: 15 min   LOS: 4 days   Charlynne Cousins  [facility] Pager [phone]  *Please refer to [email or web address], password [email or web address] to get updated schedule on who will round on this patient, as hospitalists switch teams weekly. If 7PM-7AM, please contact night-coverage at [email or web address], password [email or web address] for any overnight needs.  03/25/2015, 9:16 AM

## 2015-03-25 NOTE — Discharge Summary (Signed)
Physician Discharge Summary  Patient ID: Kevin Carlson. MRN: [id number] DOB/AGE: 1929-11-05 81 y.o.  Admit date: 03/21/2015 Discharge date: 03/25/2015  Admission Diagnoses: Post-op Fever  Discharge Diagnoses: post-inflammatory reaction to surgery, following Decompressive lumbar laminectomy L3-L5    Principal Problem:   Sepsis due to undetermined organism [facility]) Active Problems:   Hypertension   Hyperlipidemia   Atrial fibrillation ([place])   GERD (gastroesophageal reflux disease)   Polymyalgia rheumatica (HCC)   Chronic constipation   Postoperative anemia due to acute blood loss   Discharged Condition: good  Hospital Course: Jamin Sudbeck was admitted via ED 03/21/15 for evaluation of fever 102deg 24hrs post-op laminectomy L3-5.  IVAB were initiated while awaiting results for blood & urine cultures.  Hospitalist and Infectious Disease consults were obtained. Pt demonstrated temp spike once again 11/20, with associated malaise, nausea, vomiting, and generalized weakness. His labs revealed elevated WBC, not unusual postoperatively, which trended downward. Cultures negative. CXR negative. Incision has continued to heal nicely without erythema, swelling, or drainage. His nausea and malaise have resolved. He is ambulating with improved strength, though his endurance remain low, as expected. Antibiotics are discontinued.   Consults: ID, Medicine  Significant Diagnostic Studies: microbiology: blood culture: negative  Treatments: antibiotics: vancomycin  Discharge Exam: Blood pressure 120/62, pulse 96, temperature 97.9 F (36.6 C), temperature source Oral, resp. rate 20, height 6\' 2"  (1.88 m), weight 88.86 kg (195 lb 14.4 oz), SpO2 97 %. Strength full, denies stiff neck or significant pain.  Nausea resolved.  Ambulating without difficulty.   Disposition: 01-Home or Self Care Follow up with Dr. Vertell Limber as planned. No rx needed. Pt has pain med at home for prn use.      Medication List     ASK your doctor about these medications        amLODipine-benazepril 5-10 MG capsule  Commonly known as:  LOTREL  Take 1 capsule by mouth daily.     gabapentin 600 MG tablet  Commonly known as:  NEURONTIN  Take 600 mg by mouth 3 (three) times daily.     HYDROcodone-acetaminophen 10-325 MG tablet  Commonly known as:  NORCO  Take 1 tablet by mouth every 6 (six) hours as needed for moderate pain.     lansoprazole 30 MG capsule  Commonly known as:  PREVACID  Take 30 mg by mouth daily.     metoprolol tartrate 25 MG tablet  Commonly known as:  LOPRESSOR  Take 25 mg by mouth daily.     multivitamin with minerals Tabs tablet  Take 1 tablet by mouth daily.     polyethylene glycol packet  Commonly known as:  MIRALAX / GLYCOLAX  Take 17 g by mouth daily as needed (constipation).     predniSONE 5 MG tablet  Commonly known as:  DELTASONE  Take 5 mg by mouth daily with breakfast.     simvastatin 40 MG tablet  Commonly known as:  ZOCOR  Take 40 mg by mouth daily.     tamsulosin 0.4 MG Caps capsule  Commonly known as:  FLOMAX  Take 0.4 mg by mouth daily.     tiZANidine 4 MG capsule  Commonly known as:  ZANAFLEX  Take 4 mg by mouth at bedtime.         Signed: Verdis Prime 03/25/2015, 10:51 AM

## 2015-03-26 LAB — CULTURE, BLOOD (ROUTINE X 2)
CULTURE: NO GROWTH
Culture: NO GROWTH

## 2015-11-11 ENCOUNTER — Telehealth: Payer: Self-pay

## 2015-11-11 NOTE — Telephone Encounter (Signed)
LMOM to call. Referral says he is on Xarelto and he will need and OV.

## 2015-11-11 NOTE — Telephone Encounter (Signed)
Pt said he has not been on Xarelto for a long time. But he needed referral for EGD not the colonoscopy. He has called his PCP to change the referral.  OV with Neil Crouch, PA on 12/15/2015 at 9:00 Am.

## 2015-11-11 NOTE — Telephone Encounter (Signed)
Kevin Carlson, RECEIVED LETTER [phone]

## 2015-12-15 ENCOUNTER — Ambulatory Visit: Payer: Medicare Other | Admitting: Gastroenterology

## 2016-10-05 ENCOUNTER — Encounter (INDEPENDENT_AMBULATORY_CARE_PROVIDER_SITE_OTHER): Payer: Self-pay | Admitting: *Deleted

## 2016-10-12 ENCOUNTER — Encounter (INDEPENDENT_AMBULATORY_CARE_PROVIDER_SITE_OTHER): Payer: Self-pay

## 2016-10-12 ENCOUNTER — Encounter (INDEPENDENT_AMBULATORY_CARE_PROVIDER_SITE_OTHER): Payer: Self-pay | Admitting: Internal Medicine

## 2016-10-12 ENCOUNTER — Telehealth (INDEPENDENT_AMBULATORY_CARE_PROVIDER_SITE_OTHER): Payer: Self-pay | Admitting: *Deleted

## 2016-10-12 ENCOUNTER — Ambulatory Visit (INDEPENDENT_AMBULATORY_CARE_PROVIDER_SITE_OTHER): Payer: Medicare Other | Admitting: Internal Medicine

## 2016-10-12 ENCOUNTER — Encounter (INDEPENDENT_AMBULATORY_CARE_PROVIDER_SITE_OTHER): Payer: Self-pay | Admitting: *Deleted

## 2016-10-12 ENCOUNTER — Other Ambulatory Visit (INDEPENDENT_AMBULATORY_CARE_PROVIDER_SITE_OTHER): Payer: Self-pay | Admitting: Internal Medicine

## 2016-10-12 VITALS — BP 124/62 | HR 60 | Temp 97.6°F | Ht 75.0 in | Wt 185.0 lb

## 2016-10-12 DIAGNOSIS — K2270 Barrett's esophagus without dysplasia: Secondary | ICD-10-CM

## 2016-10-12 DIAGNOSIS — Z86010 Personal history of colonic polyps: Secondary | ICD-10-CM | POA: Insufficient documentation

## 2016-10-12 DIAGNOSIS — D508 Other iron deficiency anemias: Secondary | ICD-10-CM

## 2016-10-12 DIAGNOSIS — D649 Anemia, unspecified: Secondary | ICD-10-CM | POA: Insufficient documentation

## 2016-10-12 LAB — IRON AND TIBC
%SAT: 16 % (ref 15–60)
Iron: 63 ug/dL (ref 50–180)
TIBC: 388 ug/dL (ref 250–425)
UIBC: 325 ug/dL

## 2016-10-12 MED ORDER — PEG 3350-KCL-NA BICARB-NACL 420 G PO SOLR: 4000.0000 mL | Freq: Once | ORAL | 0 refills | Status: AC

## 2016-10-12 NOTE — Telephone Encounter (Signed)
Per Dr Rosalita Chessman it is ok for patient to hold Xarelto 2 days prior to TCS sch'd 11/18/16, patient aware

## 2016-10-12 NOTE — Progress Notes (Addendum)
Subjective:    Patient ID: Kevin Carlson., male    DOB: [birth year], 83 y.o.   MRN: [id number]  HPI  Referred by Dr. Vito Berger for anemia. Denies seeing any rectal bleeding.  He says he turned in a stool specimen and was positive for blood. Appetite is good. No weight loss. Last colonoscopy in 2008 with adenomatous hyperplasia.  No change in his stools.  No abdominal pain Hx of Barrett's. Last EGD was in 2011 at [place] which revealed erosive esophagitis, possible some degree gastroparesis. May have underlying Barrett's esophagus  (traumatic intubation per patient)  GERD controlled with Prevacid.     09/20/2016 H and H 9.7 and 30.6. MCV 106.3  01/09/2007 Heme positive stool: Colonoscopy: Small sigmoid polyp. Otherwise negative colonoscopy. Biopsy: adenomatous hyperplasia.  Atrial fib and maintained on Xarelto  10/06/2006 EGD: Hx of Barrett's Large 4-5 cm hiatal hernia that had some erosions in the hernia, gastric mucosa, probably traumatic from slipping in and out of the diaphragmatic hiatus.  Stomach, pyloric channel and duodenum are unremarkable.  . Biopsy: Barrett's esophagus. Review of Systems  Past Medical History:  Diagnosis Date  . Anemia   . Arthritis   . BPH (benign prostatic hyperplasia)   . Cancer ([place])    Skin Cancer-   . Carpal tunnel syndrome   . Complication of anesthesia 2011   " bleeding from Esophagus after extubated, had to have an endo  . Constipation due to opioid therapy   . DDD (degenerative disc disease), cervical   . Dysrhythmia   . GERD (gastroesophageal reflux disease)   . History of hiatal hernia   . Hyperlipemia   . Hypertension   . Nasal congestion   . Neuropathy   . Osteopenia   . Polymyalgia rheumatica (HCC)     Past Surgical History:  Procedure Laterality Date  . APPENDECTOMY  1951  . COLON RESECTION  1951   after appendectomy  . COLONOSCOPY W/ POLYPECTOMY    . EYE SURGERY Bilateral    cataract  . FINGER SURGERY Right    index-  pinned  . HERNIA REPAIR Right   . HERNIA REPAIR Right 1948  . KNEE SURGERY Right 1974   "cleaned it out"  . LUMBAR LAMINECTOMY/DECOMPRESSION MICRODISCECTOMY N/A 03/20/2015   Procedure: Decompressive lumbar laminectomy L3-L5 ;  Surgeon: Erline Levine, MD;  Location: [place] [facility];  Service: Neurosurgery;  Laterality: N/A;  . REPLACEMENT TOTAL KNEE Left 2011  . REPLACEMENT TOTAL KNEE Right 2006  . TONSILLECTOMY      Allergies  Allergen Reactions  . Codeine Other (See Comments)    fidgety    Current Outpatient Prescriptions on File Prior to Visit  Medication Sig Dispense Refill  . amLODipine-benazepril (LOTREL) 5-10 MG capsule Take 1 capsule by mouth daily.    Marland Kitchen gabapentin (NEURONTIN) 600 MG tablet Take 600 mg by mouth 3 (three) times daily.     Marland Kitchen HYDROcodone-acetaminophen (NORCO) 10-325 MG tablet Take 1 tablet by mouth every 6 (six) hours as needed for moderate pain.    Marland Kitchen lansoprazole (PREVACID) 30 MG capsule Take 30 mg by mouth daily.     . predniSONE (DELTASONE) 5 MG tablet Take 5 mg by mouth daily with breakfast.    . simvastatin (ZOCOR) 40 MG tablet Take 40 mg by mouth daily.    . tamsulosin (FLOMAX) 0.4 MG CAPS capsule Take 0.4 mg by mouth daily.     . metoprolol tartrate (LOPRESSOR) 25 MG tablet Take 25 mg by mouth  daily.     . Multiple Vitamin (MULTIVITAMIN WITH MINERALS) TABS tablet Take 1 tablet by mouth daily.    . polyethylene glycol (MIRALAX / GLYCOLAX) packet Take 17 g by mouth daily as needed (constipation).     Marland Kitchen tiZANidine (ZANAFLEX) 4 MG capsule Take 4 mg by mouth at bedtime.     No current facility-administered medications on file prior to visit.            Objective:   Physical Exam Blood pressure 124/62, pulse 60, temperature 97.6 F (36.4 C), height 6\' 3"  (1.905 m), weight 185 lb (83.9 kg). Alert and oriented. Skin warm and dry. Oral mucosa is moist.   . Sclera anicteric, conjunctivae is pink. Thyroid not enlarged. No cervical lymphadenopathy. Lungs clear.  Heart regular rate and rhythm.  Abdomen is soft. Bowel sounds are positive. No hepatomegaly. No abdominal masses felt. No tenderness.  No edema to lower extremities.          Assessment & Plan:  Anemia. Am going to get iron studies. Colonoscopy. Colonic neoplasm, polyp needs to be ruled out.  Barrett's esophagus. Needs surveillance.  EGD Ferritin, Iron, Iron TIBC

## 2016-10-12 NOTE — Patient Instructions (Signed)
The risks of bleeding, perforation and infection were reviewed with patient.  

## 2016-10-12 NOTE — Telephone Encounter (Signed)
Patient needs trilyte 

## 2016-10-13 LAB — FERRITIN: FERRITIN: 22 ng/mL (ref 20–380)

## 2016-11-18 ENCOUNTER — Ambulatory Visit (HOSPITAL_COMMUNITY)
Admission: RE | Admit: 2016-11-18 | Discharge: 2016-11-18 | Disposition: A | Discharge status: Home / Self Care | Payer: Medicare Other | Source: Ambulatory Visit | Attending: Internal Medicine | Admitting: Internal Medicine

## 2016-11-18 ENCOUNTER — Encounter (HOSPITAL_COMMUNITY): Payer: Self-pay | Admitting: *Deleted

## 2016-11-18 ENCOUNTER — Encounter (HOSPITAL_COMMUNITY)
Admission: RE | Disposition: A | Discharge status: Home / Self Care | Payer: Self-pay | Source: Ambulatory Visit | Attending: Internal Medicine

## 2016-11-18 DIAGNOSIS — K219 Gastro-esophageal reflux disease without esophagitis: Secondary | ICD-10-CM | POA: Insufficient documentation

## 2016-11-18 DIAGNOSIS — Z860100 Personal history of colon polyps, unspecified: Secondary | ICD-10-CM | POA: Insufficient documentation

## 2016-11-18 DIAGNOSIS — G5600 Carpal tunnel syndrome, unspecified upper limb: Secondary | ICD-10-CM | POA: Insufficient documentation

## 2016-11-18 DIAGNOSIS — K449 Diaphragmatic hernia without obstruction or gangrene: Secondary | ICD-10-CM | POA: Diagnosis not present

## 2016-11-18 DIAGNOSIS — R195 Other fecal abnormalities: Secondary | ICD-10-CM | POA: Diagnosis not present

## 2016-11-18 DIAGNOSIS — D508 Other iron deficiency anemias: Secondary | ICD-10-CM

## 2016-11-18 DIAGNOSIS — M353 Polymyalgia rheumatica: Secondary | ICD-10-CM | POA: Diagnosis not present

## 2016-11-18 DIAGNOSIS — D649 Anemia, unspecified: Secondary | ICD-10-CM | POA: Insufficient documentation

## 2016-11-18 DIAGNOSIS — K644 Residual hemorrhoidal skin tags: Secondary | ICD-10-CM | POA: Insufficient documentation

## 2016-11-18 DIAGNOSIS — Z96653 Presence of artificial knee joint, bilateral: Secondary | ICD-10-CM | POA: Diagnosis not present

## 2016-11-18 DIAGNOSIS — Z9841 Cataract extraction status, right eye: Secondary | ICD-10-CM | POA: Insufficient documentation

## 2016-11-18 DIAGNOSIS — G629 Polyneuropathy, unspecified: Secondary | ICD-10-CM | POA: Diagnosis not present

## 2016-11-18 DIAGNOSIS — M8580 Other specified disorders of bone density and structure, unspecified site: Secondary | ICD-10-CM | POA: Diagnosis not present

## 2016-11-18 DIAGNOSIS — N400 Enlarged prostate without lower urinary tract symptoms: Secondary | ICD-10-CM | POA: Diagnosis not present

## 2016-11-18 DIAGNOSIS — Z87891 Personal history of nicotine dependence: Secondary | ICD-10-CM | POA: Insufficient documentation

## 2016-11-18 DIAGNOSIS — Z7901 Long term (current) use of anticoagulants: Secondary | ICD-10-CM | POA: Insufficient documentation

## 2016-11-18 DIAGNOSIS — Z79899 Other long term (current) drug therapy: Secondary | ICD-10-CM | POA: Insufficient documentation

## 2016-11-18 DIAGNOSIS — M5030 Other cervical disc degeneration, unspecified cervical region: Secondary | ICD-10-CM | POA: Insufficient documentation

## 2016-11-18 DIAGNOSIS — Z7952 Long term (current) use of systemic steroids: Secondary | ICD-10-CM | POA: Insufficient documentation

## 2016-11-18 DIAGNOSIS — I10 Essential (primary) hypertension: Secondary | ICD-10-CM | POA: Insufficient documentation

## 2016-11-18 DIAGNOSIS — Z85828 Personal history of other malignant neoplasm of skin: Secondary | ICD-10-CM | POA: Diagnosis not present

## 2016-11-18 DIAGNOSIS — Z9889 Other specified postprocedural states: Secondary | ICD-10-CM | POA: Insufficient documentation

## 2016-11-18 DIAGNOSIS — K5730 Diverticulosis of large intestine without perforation or abscess without bleeding: Secondary | ICD-10-CM | POA: Diagnosis not present

## 2016-11-18 DIAGNOSIS — D120 Benign neoplasm of cecum: Secondary | ICD-10-CM

## 2016-11-18 DIAGNOSIS — E785 Hyperlipidemia, unspecified: Secondary | ICD-10-CM | POA: Insufficient documentation

## 2016-11-18 DIAGNOSIS — Z9049 Acquired absence of other specified parts of digestive tract: Secondary | ICD-10-CM | POA: Insufficient documentation

## 2016-11-18 DIAGNOSIS — D500 Iron deficiency anemia secondary to blood loss (chronic): Secondary | ICD-10-CM

## 2016-11-18 DIAGNOSIS — K2270 Barrett's esophagus without dysplasia: Secondary | ICD-10-CM | POA: Insufficient documentation

## 2016-11-18 DIAGNOSIS — Z9842 Cataract extraction status, left eye: Secondary | ICD-10-CM | POA: Insufficient documentation

## 2016-11-18 HISTORY — PX: POLYPECTOMY: SHX5525

## 2016-11-18 HISTORY — PX: ESOPHAGOGASTRODUODENOSCOPY: SHX5428

## 2016-11-18 HISTORY — PX: BIOPSY: SHX5522

## 2016-11-18 HISTORY — PX: COLONOSCOPY: SHX5424

## 2016-11-18 LAB — HEMOGLOBIN AND HEMATOCRIT, BLOOD
HCT: 33.3 % — ABNORMAL LOW (ref 39.0–52.0)
HEMOGLOBIN: 10.8 g/dL — AB (ref 13.0–17.0)

## 2016-11-18 SURGERY — EGD (ESOPHAGOGASTRODUODENOSCOPY)
Anesthesia: Moderate Sedation

## 2016-11-18 MED ORDER — MEPERIDINE HCL 50 MG/ML IJ SOLN
INTRAMUSCULAR | Status: AC
  Filled 2016-11-18: qty 1

## 2016-11-18 MED ORDER — MIDAZOLAM HCL 5 MG/5ML IJ SOLN
INTRAMUSCULAR | Status: DC | PRN
  Administered 2016-11-18 (×6): 1 mg via INTRAVENOUS

## 2016-11-18 MED ORDER — SODIUM CHLORIDE 0.9 % IV SOLN
INTRAVENOUS | Status: DC
  Administered 2016-11-18: 12:00:00 via INTRAVENOUS

## 2016-11-18 MED ORDER — MEPERIDINE HCL 50 MG/ML IJ SOLN
INTRAMUSCULAR | Status: DC | PRN
  Administered 2016-11-18 (×2): 25 mg via INTRAVENOUS

## 2016-11-18 MED ORDER — LIDOCAINE VISCOUS 2 % MT SOLN
OROMUCOSAL | Status: AC
  Filled 2016-11-18: qty 15

## 2016-11-18 MED ORDER — LIDOCAINE VISCOUS 2 % MT SOLN
OROMUCOSAL | Status: DC | PRN
  Administered 2016-11-18: 4 mL via OROMUCOSAL

## 2016-11-18 MED ORDER — STERILE WATER FOR IRRIGATION IR SOLN
Status: DC | PRN
  Administered 2016-11-18: 13:00:00

## 2016-11-18 MED ORDER — MIDAZOLAM HCL 5 MG/5ML IJ SOLN
INTRAMUSCULAR | Status: AC
  Filled 2016-11-18: qty 10

## 2016-11-18 NOTE — Discharge Instructions (Signed)
No aspirin or NSAIDs for 1 week. Resume Xarelto on 11/22/2016. High fiber diet. No driving for 24 hours. Physician will call with results of blood test and biopsy results.   Colonoscopy, Adult, Care After This sheet gives you information about how to care for yourself after your procedure. Your health care provider may also give you more specific instructions. If you have problems or questions, contact your health care provider. What can I expect after the procedure? After the procedure, it is common to have:  A small amount of blood in your stool for 24 hours after the procedure.  Some gas.  Mild abdominal cramping or bloating.  Follow these instructions at home: General instructions   For the first 24 hours after the procedure: ? Do not drive or use machinery. ? Do not sign important documents. ? Do not drink alcohol. ? Do your regular daily activities at a slower pace than normal. ? Eat soft, easy-to-digest foods. ? Rest often.  Take over-the-counter or prescription medicines only as told by your health care provider.  It is up to you to get the results of your procedure. Ask your health care provider, or the department performing the procedure, when your results will be ready. Relieving cramping and bloating  Try walking around when you have cramps or feel bloated.  Apply heat to your abdomen as told by your health care provider. Use a heat source that your health care provider recommends, such as a moist heat pack or a heating pad. ? Place a towel between your skin and the heat source. ? Leave the heat on for 20-30 minutes. ? Remove the heat if your skin turns bright red. This is especially important if you are unable to feel pain, heat, or cold. You may have a greater risk of getting burned. Eating and drinking  Drink enough fluid to keep your urine clear or pale yellow.  Resume your normal diet as instructed by your health care provider. Avoid heavy or fried foods  that are hard to digest.  Avoid drinking alcohol for as long as instructed by your health care provider. Contact a health care provider if:  You have blood in your stool 2-3 days after the procedure. Get help right away if:  You have more than a small spotting of blood in your stool.  You pass large blood clots in your stool.  Your abdomen is swollen.  You have nausea or vomiting.  You have a fever.  You have increasing abdominal pain that is not relieved with medicine. This information is not intended to replace advice given to you by your health care provider. Make sure you discuss any questions you have with your health care provider. Document Released: 12/01/2003 Document Revised: 01/11/2016 Document Reviewed: 06/30/2015 Elsevier Interactive Patient Education  2018 [employer].  Esophagogastroduodenoscopy, Care After Refer to this sheet in the next few weeks. These instructions provide you with information about caring for yourself after your procedure. Your health care provider may also give you more specific instructions. Your treatment has been planned according to current medical practices, but problems sometimes occur. Call your health care provider if you have any problems or questions after your procedure. What can I expect after the procedure? After the procedure, it is common to have:  A sore throat.  Nausea.  Bloating.  Dizziness.  Fatigue.  Follow these instructions at home:  Do not eat or drink anything until the numbing medicine (local anesthetic) has worn off and your gag  reflex has returned. You will know that the local anesthetic has worn off when you can swallow comfortably.  Do not drive for 24 hours if you received a medicine to help you relax (sedative).  If your health care provider took a tissue sample for testing during the procedure, make sure to get your test results. This is your responsibility. Ask your health care provider or the  department performing the test when your results will be ready.  Keep all follow-up visits as told by your health care provider. This is important. Contact a health care provider if:  You cannot stop coughing.  You are not urinating.  You are urinating less than usual. Get help right away if:  You have trouble swallowing.  You cannot eat or drink.  You have throat or chest pain that gets worse.  You are dizzy or light-headed.  You faint.  You have nausea or vomiting.  You have chills.  You have a fever.  You have severe abdominal pain.  You have black, tarry, or bloody stools. This information is not intended to replace advice given to you by your health care provider. Make sure you discuss any questions you have with your health care provider. Document Released: 04/04/2012 Document Revised: 09/24/2015 Document Reviewed: 03/12/2015 Elsevier Interactive Patient Education  2018 [employer].   High-Fiber Diet Fiber, also called dietary fiber, is a type of carbohydrate found in fruits, vegetables, whole grains, and beans. A high-fiber diet can have many health benefits. Your health care provider may recommend a high-fiber diet to help:  Prevent constipation. Fiber can make your bowel movements more regular.  Lower your cholesterol.  Relieve hemorrhoids, uncomplicated diverticulosis, or irritable bowel syndrome.  Prevent overeating as part of a weight-loss plan.  Prevent heart disease, type 2 diabetes, and certain cancers.  What is my plan? The recommended daily intake of fiber includes:  38 grams for men under age 77.  37 grams for men over age 48.  71 grams for women under age 71.  69 grams for women over age 80.  You can get the recommended daily intake of dietary fiber by eating a variety of fruits, vegetables, grains, and beans. Your health care provider may also recommend a fiber supplement if it is not possible to get enough fiber through your  diet. What do I need to know about a high-fiber diet?  Fiber supplements have not been widely studied for their effectiveness, so it is better to get fiber through food sources.  Always check the fiber content on thenutrition facts label of any prepackaged food. Look for foods that contain at least 5 grams of fiber per serving.  Ask your dietitian if you have questions about specific foods that are related to your condition, especially if those foods are not listed in the following section.  Increase your daily fiber consumption gradually. Increasing your intake of dietary fiber too quickly may cause bloating, cramping, or gas.  Drink plenty of water. Water helps you to digest fiber. What foods can I eat? Grains Whole-grain breads. Multigrain cereal. Oats and oatmeal. Brown rice. Barley. Bulgur wheat. [place]. Bran muffins. Popcorn. Rye wafer crackers. Vegetables Sweet potatoes. Spinach. Kale. Artichokes. Cabbage. Broccoli. Green peas. Carrots. Squash. Fruits Berries. Pears. Apples. Oranges. Avocados. Prunes and raisins. Dried figs. Meats and Other Protein Sources [place], kidney, pinto, and soy beans. Split peas. Lentils. Nuts and seeds. Dairy Fiber-fortified yogurt. Beverages Fiber-fortified soy milk. Fiber-fortified orange juice. Other Fiber bars. The items listed above may  not be a complete list of recommended foods or beverages. Contact your dietitian for more options. What foods are not recommended? Grains White bread. Pasta made with refined flour. White rice. Vegetables Fried potatoes. Canned vegetables. Well-cooked vegetables. Fruits Fruit juice. Cooked, strained fruit. Meats and Other Protein Sources Fatty cuts of meat. Fried Sales executive or fried fish. Dairy Milk. Yogurt. Cream cheese. Sour cream. Beverages Soft drinks. Other Cakes and pastries. Butter and oils. The items listed above may not be a complete list of foods and beverages to avoid. Contact your dietitian for  more information. What are some tips for including high-fiber foods in my diet?  Eat a wide variety of high-fiber foods.  Make sure that half of all grains consumed each day are whole grains.  Replace breads and cereals made from refined flour or white flour with whole-grain breads and cereals.  Replace white rice with brown rice, bulgur wheat, or millet.  Start the day with a breakfast that is high in fiber, such as a cereal that contains at least 5 grams of fiber per serving.  Use beans in place of meat in soups, salads, or pasta.  Eat high-fiber snacks, such as berries, raw vegetables, nuts, or popcorn. This information is not intended to replace advice given to you by your health care provider. Make sure you discuss any questions you have with your health care provider. Document Released: 04/18/2005 Document Revised: 09/24/2015 Document Reviewed: 10/01/2013 Elsevier Interactive Patient Education  2017 [employer].

## 2016-11-18 NOTE — Op Note (Signed)
[facility] Patient Name: Kevin Carlson Procedure Date: 11/18/2016 1:14 PM MRN: [id number] Date of Birth: Nov 20, 1929 Attending MD: Hildred Laser , MD CSN: [id number] Age: 83 Admit Type: Outpatient Procedure:                Colonoscopy Indications:              Heme positive stool Providers:                Hildred Laser, MD, Otis Peak B. Sharon Seller, RN, Aram Candela Referring MD:             Kennieth Rad, MD Medicines:                Midazolam 2 mg IV Complications:            No immediate complications. Estimated Blood Loss:     Estimated blood loss: none. Procedure:                Pre-Anesthesia Assessment:                           - Prior to the procedure, a History and Physical                            was performed, and patient medications and                            allergies were reviewed. The patient's tolerance of                            previous anesthesia was also reviewed. The risks                            and benefits of the procedure and the sedation                            options and risks were discussed with the patient.                            All questions were answered, and informed consent                            was obtained. Prior Anticoagulants: The patient                            last took Xarelto (rivaroxaban) 2 days prior to the                            procedure. ASA Grade Assessment: III - A patient                            with severe systemic disease. After reviewing the  risks and benefits, the patient was deemed in                            satisfactory condition to undergo the procedure.                           After obtaining informed consent, the colonoscope                            was passed under direct vision. Throughout the                            procedure, the patient's blood pressure, pulse, and                            oxygen saturations were  monitored continuously. The                            [id number] ([id number]) was introduced through the                            anus and advanced to the the cecum, identified by                            appendiceal orifice and ileocecal valve. The                            colonoscopy was somewhat difficult due to a                            tortuous colon. The patient tolerated the procedure                            well. The quality of the bowel preparation was                            good. The ileocecal valve, appendiceal orifice, and                            rectum were photographed. Scope In: 1:17:20 PM Scope Out: 1:53:44 PM Scope Withdrawal Time: 0 hours 25 minutes 16 seconds  Total Procedure Duration: 0 hours 36 minutes 24 seconds  Findings:      The perianal and digital rectal examinations were normal.      Three multi-lobulated polyps were found in the cecum. The polyps were 7       to 12 mm in size. These polyps were removed with a piecemeal technique       using a hot snare. Resection was complete, but the polyp tissue was only       partially retrieved. To prevent bleeding after the polypectomy, two       hemostatic clips were successfully placed (MR conditional). There was no       bleeding at the end of the procedure. The pathology specimen was placed       into Bottle Number 2.  Multiple small and large-mouthed diverticula were found in the sigmoid       colon.      External hemorrhoids were found during retroflexion. The hemorrhoids       were small. Impression:               - Three 7 to 12 mm polyps (cluster) in the cecum,                            removed piecemeal using a hot snare. Complete                            resection. Partial retrieval. Clips (MR                            conditional) were placed.                           - Diverticulosis in the sigmoid colon.                           - External hemorrhoids. Moderate Sedation:       Moderate (conscious) sedation was administered by the endoscopy nurse       and supervised by the endoscopist. The following parameters were       monitored: oxygen saturation, heart rate, blood pressure, CO2       capnography and response to care. Total physician intraservice time was       40 minutes. Recommendation:           - Patient has a contact number available for                            emergencies. The signs and symptoms of potential                            delayed complications were discussed with the                            patient. Return to normal activities tomorrow.                            Written discharge instructions were provided to the                            patient.                           - High fiber diet today.                           - Continue present medications.                           - Resume Xarelto (rivaroxaban) at prior dose on                            11/22/2016.                           -  Await pathology results.                           - Repeat colonoscopy for surveillance based on                            pathology results. Procedure Code(s):        --- Professional ---                           [id number], Colonoscopy, flexible; with removal of                            tumor(s), polyp(s), or other lesion(s) by snare                            technique                           99152, Moderate sedation services provided by the                            same physician or other qualified health care                            professional performing the diagnostic or                            therapeutic service that the sedation supports,                            requiring the presence of an independent trained                            observer to assist in the monitoring of the                            patient's level of consciousness and physiological                            status; initial 15 minutes of  intraservice time,                            patient age 92 years or older                           [phone], Moderate sedation services; each additional                            15 minutes intraservice time                           99153, Moderate sedation services; each additional                            15 minutes intraservice time Diagnosis  Code(s):        --- Professional ---                           D12.0, Benign neoplasm of cecum                           K64.4, Residual hemorrhoidal skin tags                           R19.5, Other fecal abnormalities                           K57.30, Diverticulosis of large intestine without                            perforation or abscess without bleeding CPT copyright 2016 American Medical Association. All rights reserved. The codes documented in this report are preliminary and upon coder review may  be revised to meet current compliance requirements. Hildred Laser, MD Hildred Laser, MD 11/18/2016 2:13:11 PM This report has been signed electronically. Number of Addenda: 0

## 2016-11-18 NOTE — Op Note (Addendum)
[facility] Patient Name: Kevin Carlson Procedure Date: 11/18/2016 12:15 PM MRN: [id number] Date of Birth: [birth year] Attending MD: Hildred Laser , MD CSN: [id number] Age: 83 Admit Type: Outpatient Procedure:                Upper GI endoscopy Indications:              Follow-up of Barrett's esophagus Providers:                Hildred Laser, MD, Otis Peak B. Sharon Seller, RN, Aram Candela Referring MD:             Kennieth Rad, MD Medicines:                Lidocaine spray, Meperidine 50 mg IV, Midazolam 4                            mg IV Complications:            No immediate complications. Estimated Blood Loss:     Estimated blood loss was minimal. Procedure:                Pre-Anesthesia Assessment:                           - Prior to the procedure, a History and Physical                            was performed, and patient medications and                            allergies were reviewed. The patient's tolerance of                            previous anesthesia was also reviewed. The risks                            and benefits of the procedure and the sedation                            options and risks were discussed with the patient.                            All questions were answered, and informed consent                            was obtained. Prior Anticoagulants: The patient                            last took Xarelto (rivaroxaban) 2 days prior to the                            procedure. ASA Grade Assessment: III - A patient  with severe systemic disease. After reviewing the                            risks and benefits, the patient was deemed in                            satisfactory condition to undergo the procedure.                           After obtaining informed consent, the endoscope was                            passed under direct vision. Throughout the                            procedure, the  patient's blood pressure, pulse, and                            oxygen saturations were monitored continuously. The                            [id number] ([id number]) scope was introduced through the                            mouth, and advanced to the second part of duodenum.                            The upper GI endoscopy was accomplished without                            difficulty. The patient tolerated the procedure                            well. Scope In: 1:05:43 PM Scope Out: 1:13:51 PM Total Procedure Duration: 0 hours 8 minutes 8 seconds  Findings:      The proximal esophagus and mid esophagus were normal.      There were esophageal mucosal changes secondary to established       short-segment Barrett's disease present in the distal esophagus. The       maximum longitudinal extent of these mucosal changes was 1 cm in length.       Mucosa was biopsied with a cold forceps for histology randomly. The       pathology specimen was placed into Bottle Number 1.      A 4 cm hiatal hernia was present.      The entire examined stomach was normal.      The duodenal bulb and second portion of the duodenum were normal. Impression:               - Normal proximal esophagus and mid esophagus.                           - Esophageal mucosal changes secondary to  established short-segment Barrett's disease.                            Biopsied.                           - GEJ at 38 cm and hiatus at 42 cm.                           - 4 cm hiatal hernia.                           - Normal stomach.                           - Normal duodenal bulb and second portion of the                            duodenum. Moderate Sedation:      Moderate (conscious) sedation was administered by the endoscopy nurse       and supervised by the endoscopist. The following parameters were       monitored: oxygen saturation, heart rate, blood pressure, CO2       capnography and response to  care. Total physician intraservice time was       13 minutes. Recommendation:           - Patient has a contact number available for                            emergencies. The signs and symptoms of potential                            delayed complications were discussed with the                            patient. Return to normal activities tomorrow.                            Written discharge instructions were provided to the                            patient.                           - High fiber diet today.                           - Continue present medications.                           - Await pathology results.                           - No repeat upper endoscopy. Procedure Code(s):        --- Professional ---  74734, Esophagogastroduodenoscopy, flexible,                            transoral; with biopsy, single or multiple                           99152, Moderate sedation services provided by the                            same physician or other qualified health care                            professional performing the diagnostic or                            therapeutic service that the sedation supports,                            requiring the presence of an independent trained                            observer to assist in the monitoring of the                            patient's level of consciousness and physiological                            status; initial 15 minutes of intraservice time,                            patient age 55 years or older Diagnosis Code(s):        --- Professional ---                           K22.70, Barrett's esophagus without dysplasia                           K44.9, Diaphragmatic hernia without obstruction or                            gangrene CPT copyright 2016 American Medical Association. All rights reserved. The codes documented in this report are preliminary and upon coder review may  be revised  to meet current compliance requirements. Hildred Laser, MD Hildred Laser, MD 11/18/2016 2:07:02 PM This report has been signed electronically. Number of Addenda: 0

## 2016-11-18 NOTE — H&P (Signed)
Kevin Carlson. is an 83 y.o. male.   Chief Complaint: Patient is here for EGD and colonoscopy. HPI: Patient is 83 year old Caucasian male who was noted to have drop in his H&H recently and his stool was guaiac positive. He denies melena or rectal bleeding or abdominal pain. He has chronic GERD complicated by Barrett's esophagus. He says heartburns well controlled with therapy. Last EGD with biopsy was in June 2008. He also has history of colonic adenoma discovered on colonoscopy of September 2008. Family history is negative for CRC. He has been off Xarelto for 2 days.  Past Medical History:  Diagnosis Date  . Anemia   . Arthritis   . BPH (benign prostatic hyperplasia)   . Cancer ([place])    Skin Cancer-   . Carpal tunnel syndrome   . Complication of anesthesia 2011   " bleeding from Esophagus after extubated, had to have an endo  . Constipation due to opioid therapy   . DDD (degenerative disc disease), cervical   . Dysrhythmia   . GERD (gastroesophageal reflux disease)   . History of hiatal hernia   . Hyperlipemia   . Hypertension   .    Marland Kitchen Neuropathy   . Osteopenia   . Polymyalgia rheumatica (HCC)     Past Surgical History:  Procedure Laterality Date  . APPENDECTOMY  1951  . COLON RESECTION  1951   after appendectomy  . COLONOSCOPY W/ POLYPECTOMY    . EYE SURGERY Bilateral    cataract  . FINGER SURGERY Right    index- pinned  . HERNIA REPAIR Right   . HERNIA REPAIR Right 1948  . KNEE SURGERY Right 1974   "cleaned it out"  . LUMBAR LAMINECTOMY/DECOMPRESSION MICRODISCECTOMY N/A 03/20/2015   Procedure: Decompressive lumbar laminectomy L3-L5 ;  Surgeon: Erline Levine, MD;  Location: [place] [facility];  Service: Neurosurgery;  Laterality: N/A;  . REPLACEMENT TOTAL KNEE Left 2011  . REPLACEMENT TOTAL KNEE Right 2006  . TONSILLECTOMY      History reviewed. No pertinent family history. Social History:  reports that he quit smoking about 42 years ago. He quit after 25.00 years of  use. He has never used smokeless tobacco. He reports that he does not drink alcohol or use drugs.  Allergies: No Known Allergies  Medications Prior to Admission  Medication Sig Dispense Refill  . benazepril (LOTENSIN) 10 MG tablet Take 10 mg by mouth daily.    . diphenhydramine-acetaminophen (TYLENOL PM) 25-500 MG TABS tablet Take 1 tablet by mouth at bedtime.    . folic acid (FOLVITE) 1 MG tablet Take 1 mg by mouth daily.    Marland Kitchen gabapentin (NEURONTIN) 800 MG tablet Take 800 mg by mouth 2 (two) times daily.    Marland Kitchen HYDROcodone-acetaminophen (NORCO) 10-325 MG tablet Take 1 tablet by mouth daily as needed for moderate pain.     Marland Kitchen lansoprazole (PREVACID) 30 MG capsule Take 30 mg by mouth daily.     . methotrexate 2.5 MG tablet Take 12.5 mg by mouth every Saturday. 5 tabs a week     . metoprolol tartrate (LOPRESSOR) 25 MG tablet Take 25 mg by mouth daily.     . polyethylene glycol (MIRALAX / GLYCOLAX) packet Take 17 g by mouth daily as needed (constipation).     . predniSONE (DELTASONE) 5 MG tablet Take 5 mg by mouth daily with breakfast.    . rivaroxaban (XARELTO) 20 MG TABS tablet Take 20 mg by mouth daily.     Marland Kitchen  simvastatin (ZOCOR) 40 MG tablet Take 40 mg by mouth daily.    . tamsulosin (FLOMAX) 0.4 MG CAPS capsule Take 0.4 mg by mouth daily.     Marland Kitchen tiZANidine (ZANAFLEX) 4 MG capsule Take 4 mg by mouth at bedtime.    Marland Kitchen ibuprofen (ADVIL,MOTRIN) 200 MG tablet Take 400 mg by mouth daily as needed for moderate pain.      No results found for this or any previous visit (from the past 48 hour(s)). No results found.  ROS  Blood pressure 129/64, pulse 62, temperature 98.2 F (36.8 C), temperature source Oral, resp. rate 10, height 6\' 2"  (1.88 m), weight 190 lb (86.2 kg), SpO2 97 %. Physical Exam  Constitutional: He appears well-developed and well-nourished.  HENT:  Mouth/Throat: Oropharynx is clear and moist.  Eyes: Conjunctivae are normal. No scleral icterus.  Neck: No thyromegaly present.   Cardiovascular:  Irregular rhythm normal S1 and S2. No murmur or gallop noted.  Respiratory: Effort normal and breath sounds normal.  GI: Soft. He exhibits no distension and no mass. There is no tenderness.  Musculoskeletal:  Trace edema around left ankle.  Lymphadenopathy:    He has no cervical adenopathy.  Neurological: He is alert.  Skin: Skin is warm and dry.     Assessment/Plan Anemia and heme positive stool. Barrett's esophagus. History of colonic adenoma. Surveillance EGD and diagnostic colonoscopy.  Hildred Laser, MD 11/18/2016, 12:52 PM

## 2016-11-21 ENCOUNTER — Other Ambulatory Visit (INDEPENDENT_AMBULATORY_CARE_PROVIDER_SITE_OTHER): Payer: Self-pay | Admitting: *Deleted

## 2016-11-21 ENCOUNTER — Telehealth (INDEPENDENT_AMBULATORY_CARE_PROVIDER_SITE_OTHER): Payer: Self-pay | Admitting: *Deleted

## 2016-11-21 DIAGNOSIS — K921 Melena: Secondary | ICD-10-CM

## 2016-11-21 NOTE — Telephone Encounter (Signed)
Per Dr.Rehman the blood is coming from the polypectomy that was done on 11/18/2016. The surgical path is pending.  Dr.Rehman ask that the patient be called and ask to hold the Consu[place] Closs today and to have H&H 11/22/2016. I called the patient's number and left a message and stated that I would call his daughter ,Jenny Reichmann. I did call her and rec'd her voicemail and left a detailed message. I will attempt the patient again. Order for H&H completed.

## 2016-11-21 NOTE — Progress Notes (Signed)
Spoke with the patient regarding his post procedure colonoscopy and EGD.  Patient stated he had red blood in the toilet Saturday and a black bowel movement today.  Denied fever and chills.  Denied weakness or dizziness.  Stated he did not want to call the physicians office until his next bowel movement.  Called Dr. Otelia Limes office to notify of patients statements.  They stated they would send a message to Dr. Otelia Limes nurse so she can follow up with him.

## 2016-11-21 NOTE — Telephone Encounter (Signed)
I reached the patient on his cell number. He will come and get his lab work 11/22/2016 and he is not to start the [facility] until tomorrow , I ask that he hold till he has lab work and we get the result.

## 2016-11-21 NOTE — Telephone Encounter (Signed)
Spoke to [place] in ENDO -- she was doing f/u phone call for procedures from Friday -- patient told her when he had BM on Saturday he had BRB, no BM yesterday and today BM was black -- she wanted [place] to be aware in case you need to call him

## 2016-11-22 LAB — HEMOGLOBIN AND HEMATOCRIT, BLOOD
HEMATOCRIT: 29.9 % — AB (ref 38.5–50.0)
Hemoglobin: 9.6 g/dL — ABNORMAL LOW (ref 13.2–17.1)

## 2016-11-23 ENCOUNTER — Encounter (HOSPITAL_COMMUNITY): Payer: Self-pay | Admitting: Internal Medicine

## 2016-11-24 ENCOUNTER — Telehealth (INDEPENDENT_AMBULATORY_CARE_PROVIDER_SITE_OTHER): Payer: Self-pay | Admitting: Internal Medicine

## 2016-11-24 NOTE — Telephone Encounter (Signed)
Diagnosis 1. Esophagus, biopsy - INTESTINAL METAPLASIA (GOBLET CELL METAPLASIA) CONSISTENT WITH BARRETT'S ESOPHAGUS. - THERE IS NO EVIDENCE OF DYSPLASIA OR MALIGNANCY. 2. Colon, polyp(s), ascending - TUBULAR ADENOMA(S). - HIGH GRADE DYSPLASIA IS NOT IDENTIFIED. Enid Cutter MD Pathologist, Electronic Signature (Case signed 11/22/2016) Specimen Gross and Clinical Information Specimen(s)  Patient states that he is still off blood thinner and questions if he should go back on it? Forwarded to [place] for  Review.

## 2016-11-24 NOTE — Telephone Encounter (Signed)
Patient called, stated he hasn't heard anything from [place].  He stated he hasn't seen anymore blood in his stool.  Stated that he's still off his blood thinners and wasn't sure if he needs to go back on it or not.  He is concerned about the polyps.  [phone]

## 2016-11-25 ENCOUNTER — Other Ambulatory Visit (INDEPENDENT_AMBULATORY_CARE_PROVIDER_SITE_OTHER): Payer: Self-pay | Admitting: *Deleted

## 2016-11-25 DIAGNOSIS — K921 Melena: Secondary | ICD-10-CM

## 2016-11-25 NOTE — Telephone Encounter (Signed)
Patient was told yesterday to again anticoagulant today which is 11/25/2016

## 2016-11-27 ENCOUNTER — Encounter (HOSPITAL_COMMUNITY): Payer: Self-pay | Admitting: *Deleted

## 2016-11-27 ENCOUNTER — Observation Stay (HOSPITAL_COMMUNITY)
Admission: EM | Admit: 2016-11-27 | Discharge: 2016-11-29 | Disposition: A | Discharge status: Home / Self Care | Payer: Medicare Other | Attending: Internal Medicine | Admitting: Internal Medicine

## 2016-11-27 DIAGNOSIS — Z87891 Personal history of nicotine dependence: Secondary | ICD-10-CM | POA: Diagnosis not present

## 2016-11-27 DIAGNOSIS — D649 Anemia, unspecified: Secondary | ICD-10-CM

## 2016-11-27 DIAGNOSIS — Z96653 Presence of artificial knee joint, bilateral: Secondary | ICD-10-CM | POA: Diagnosis not present

## 2016-11-27 DIAGNOSIS — K625 Hemorrhage of anus and rectum: Secondary | ICD-10-CM | POA: Diagnosis present

## 2016-11-27 DIAGNOSIS — K219 Gastro-esophageal reflux disease without esophagitis: Secondary | ICD-10-CM | POA: Insufficient documentation

## 2016-11-27 DIAGNOSIS — K922 Gastrointestinal hemorrhage, unspecified: Secondary | ICD-10-CM | POA: Diagnosis not present

## 2016-11-27 DIAGNOSIS — I4891 Unspecified atrial fibrillation: Secondary | ICD-10-CM | POA: Insufficient documentation

## 2016-11-27 DIAGNOSIS — K5904 Chronic idiopathic constipation: Secondary | ICD-10-CM | POA: Diagnosis not present

## 2016-11-27 DIAGNOSIS — I482 Chronic atrial fibrillation, unspecified: Secondary | ICD-10-CM | POA: Diagnosis present

## 2016-11-27 DIAGNOSIS — M353 Polymyalgia rheumatica: Secondary | ICD-10-CM | POA: Insufficient documentation

## 2016-11-27 DIAGNOSIS — R42 Dizziness and giddiness: Secondary | ICD-10-CM | POA: Diagnosis not present

## 2016-11-27 DIAGNOSIS — K22719 Barrett's esophagus with dysplasia, unspecified: Secondary | ICD-10-CM | POA: Diagnosis not present

## 2016-11-27 DIAGNOSIS — Z7901 Long term (current) use of anticoagulants: Secondary | ICD-10-CM | POA: Insufficient documentation

## 2016-11-27 DIAGNOSIS — I10 Essential (primary) hypertension: Secondary | ICD-10-CM | POA: Diagnosis not present

## 2016-11-27 DIAGNOSIS — K5909 Other constipation: Secondary | ICD-10-CM | POA: Diagnosis present

## 2016-11-27 LAB — COMPREHENSIVE METABOLIC PANEL
ALK PHOS: 40 U/L (ref 38–126)
ALT: 11 U/L — AB (ref 17–63)
ANION GAP: 7 (ref 5–15)
AST: 20 U/L (ref 15–41)
Albumin: 3.7 g/dL (ref 3.5–5.0)
BILIRUBIN TOTAL: 0.5 mg/dL (ref 0.3–1.2)
BUN: 24 mg/dL — ABNORMAL HIGH (ref 6–20)
CALCIUM: 8.7 mg/dL — AB (ref 8.9–10.3)
CO2: 25 mmol/L (ref 22–32)
CREATININE: 1.39 mg/dL — AB (ref 0.61–1.24)
Chloride: 108 mmol/L (ref 101–111)
GFR calc non Af Amer: 45 mL/min — ABNORMAL LOW (ref >60)
GFR, EST AFRICAN AMERICAN: 52 mL/min — AB (ref >60)
Glucose, Bld: 132 mg/dL — ABNORMAL HIGH (ref 65–99)
Potassium: 4.2 mmol/L (ref 3.5–5.1)
Sodium: 140 mmol/L (ref 135–145)
TOTAL PROTEIN: 5.7 g/dL — AB (ref 6.5–8.1)

## 2016-11-27 LAB — CBC
HCT: 25.5 % — ABNORMAL LOW (ref 39.0–52.0)
HEMOGLOBIN: 8.4 g/dL — AB (ref 13.0–17.0)
MCH: 32.8 pg (ref 26.0–34.0)
MCHC: 32.9 g/dL (ref 30.0–36.0)
MCV: 99.6 fL (ref 78.0–100.0)
PLATELETS: 236 10*3/uL (ref 150–400)
RBC: 2.56 MIL/uL — AB (ref 4.22–5.81)
RDW: 20 % — ABNORMAL HIGH (ref 11.5–15.5)
WBC: 12.5 10*3/uL — ABNORMAL HIGH (ref 4.0–10.5)

## 2016-11-27 LAB — PREPARE RBC (CROSSMATCH)

## 2016-11-27 LAB — ABO/RH: ABO/RH(D): A POS

## 2016-11-27 MED ORDER — SODIUM CHLORIDE 0.9 % IV BOLUS (SEPSIS)
500.0000 mL | Freq: Once | INTRAVENOUS | Status: AC
  Administered 2016-11-27: 500 mL via INTRAVENOUS

## 2016-11-27 MED ORDER — SODIUM CHLORIDE 0.9 % IV SOLN: 10.0000 mL/h | Freq: Once | INTRAVENOUS | Status: DC

## 2016-11-27 NOTE — ED Notes (Signed)
Pt ambulated to BR and back to bed with steady gait.  Pt did have a bloody stool- bright red and black in color.

## 2016-11-27 NOTE — ED Notes (Signed)
This nurse in room and assist EDP and pt with rectal exam, gross blood noted to rectum

## 2016-11-27 NOTE — ED Notes (Signed)
Lab called and stated that blood is ready, Tim with house coverage called for IV pump needed prior to getting blood to start.  No IV pump available in the dept.

## 2016-11-27 NOTE — ED Notes (Signed)
Dr. Darrick Meigs in room at bedside

## 2016-11-27 NOTE — H&P (Signed)
TR[facility] H&P    Patient Demographics:    Kevin Carlson, is a 83 y.o. male  MRN: [id number]  DOB - [birth year]  Admit Date - 11/27/2016  Referring MD/NP/PA: Orlie Dakin  Outpatient Primary MD for the patient is Kennieth Rad, MD  Patient coming from: Home  Chief Complaint  Patient presents with  . Rectal Bleeding      HPI:    Kevin Carlson  is a 83 y.o. male, With history of BPH, atrial fibrillation on chronic anticoagulation with Coumadin, polymyalgia rheumatica, hypertension who underwent colonoscopy on 11/18/2016, which showed polyps, which  were removed and also showed multiple diverticula in sigmoid colon. Patient says that he was doing fine and had normal bowel movements till he  started taking Xarelto. Since he started Xarelto patient started having bloody bowel movements today. He had 6 bloody bowel movements per day and felt dizzy. He did not pass out. Did not have chest pain or shortness of breath. No nausea vomiting or diarrhea. No dysuria. He denies abdominal pain.  In the ED he was found to have hemoglobin of 8.4, 1 unit of PRBC has been ordered for transfusion.    Review of systems:      All other systems reviewed and are negative.   With Past History of the following :    Past Medical History:  Diagnosis Date  . Anemia   . Arthritis   . BPH (benign prostatic hyperplasia)   . Cancer ([place])    Skin Cancer-   . Carpal tunnel syndrome   . Complication of anesthesia 2011   " bleeding from Esophagus after extubated, had to have an endo  . Constipation due to opioid therapy   . DDD (degenerative disc disease), cervical   . Dysrhythmia   . GERD (gastroesophageal reflux disease)   . History of hiatal hernia   . Hyperlipemia   . Hypertension   . Nasal congestion   . Neuropathy   . Osteopenia   . Polymyalgia rheumatica (HCC)       Past Surgical History:  Procedure Laterality Date  .  APPENDECTOMY  1951  . BIOPSY  11/18/2016   Procedure: BIOPSY;  Surgeon: Rogene Houston, MD;  [facility];  Service: Endoscopy;;  esophagus  . COLON RESECTION  1951   after appendectomy  . COLONOSCOPY N/A 11/18/2016   Procedure: COLONOSCOPY;  Surgeon: Rogene Houston, MD;  Location: [facility];  Service: Endoscopy;  Laterality: N/A;  . COLONOSCOPY W/ POLYPECTOMY    . ESOPHAGOGASTRODUODENOSCOPY N/A 11/18/2016   Procedure: ESOPHAGOGASTRODUODENOSCOPY (EGD);  Surgeon: Rogene Houston, MD;  Location: [facility];  Service: Endoscopy;  Laterality: N/A;  12:10  . EYE SURGERY Bilateral    cataract  . FINGER SURGERY Right    index- pinned  . HERNIA REPAIR Right   . HERNIA REPAIR Right 1948  . KNEE SURGERY Right 1974   "cleaned it out"  . LUMBAR LAMINECTOMY/DECOMPRESSION MICRODISCECTOMY N/A 03/20/2015   Procedure: Decompressive lumbar laminectomy L3-L5 ;  Surgeon: Erline Levine, MD;  Location:  [place] [facility];  Service: Neurosurgery;  Laterality: N/A;  . POLYPECTOMY  11/18/2016   Procedure: POLYPECTOMY;  Surgeon: Rogene Houston, MD;  Location: [facility];  Service: Endoscopy;;  colon   . REPLACEMENT TOTAL KNEE Left 2011  . REPLACEMENT TOTAL KNEE Right 2006  . TONSILLECTOMY        Social History:      Social History  Substance Use Topics  . Smoking status: Former Smoker    Years: 25.00    Quit date: 05/02/1974  . Smokeless tobacco: Never Used     Comment: quit smoking in 1976  . Alcohol use No       Family History :    Patient's father had CAD   Home Medications:   Prior to Admission medications   Medication Sig Start Date End Date Taking? Authorizing Provider  benazepril (LOTENSIN) 10 MG tablet Take 10 mg by mouth daily.   Yes [provider]  diphenhydramine-acetaminophen (TYLENOL PM) 25-500 MG TABS tablet Take 1 tablet by mouth at bedtime.   Yes [provider]  folic acid (FOLVITE) 1 MG tablet Take 1 mg by mouth daily.   Yes [provider]  gabapentin (NEURONTIN) 800 MG tablet Take 800 mg by mouth 2 (two) times daily.   Yes [provider]  HYDROcodone-acetaminophen (NORCO) 10-325 MG tablet Take 1 tablet by mouth daily as needed for moderate pain.    Yes [provider]  lansoprazole (PREVACID) 30 MG capsule Take 30 mg by mouth daily.    Yes [provider]  methotrexate 2.5 MG tablet Take 12.5 mg by mouth every Saturday. 5 tabs a week    Yes [provider]  metoprolol tartrate (LOPRESSOR) 25 MG tablet Take 25 mg by mouth daily.    Yes [provider]  polyethylene glycol (MIRALAX / GLYCOLAX) packet Take 17 g by mouth daily as needed (constipation).    Yes [provider]  predniSONE (DELTASONE) 5 MG tablet Take 5 mg by mouth daily with breakfast.   Yes [provider]  rivaroxaban (XARELTO) 20 MG TABS tablet Take 1 tablet (20 mg total) by mouth daily. 11/22/16  Yes Rehman, Mechele Dawley, MD  simvastatin (ZOCOR) 40 MG tablet Take 40 mg by mouth daily.   Yes [provider]  tamsulosin (FLOMAX) 0.4 MG CAPS capsule Take 0.4 mg by mouth daily.    Yes [provider]  tiZANidine (ZANAFLEX) 4 MG capsule Take 4 mg by mouth at bedtime.   Yes [provider]     Allergies:    No Known Allergies   Physical Exam:   Vitals  Blood pressure 112/73, pulse 72, temperature 98 F (36.7 C), temperature source Oral, resp. rate 18, height 6\' 2"  (1.88 m), weight 86.2 kg (190 lb), SpO2 94 %.  1.  General: Appears in no acute distress  2. Psychiatric:  Intact judgement and  insight, awake alert, oriented x 3.  3. Neurologic: No focal neurological deficits, all cranial nerves intact.Strength 5/5 all 4 extremities, sensation intact all 4 extremities, plantars down going.  4. Eyes :  anicteric sclerae, moist conjunctivae with no lid lag. PERRLA.  5. ENMT:  Oropharynx clear with moist mucous membranes and good dentition  6. Neck:  supple,  no cervical lymphadenopathy appriciated, No thyromegaly  7. Respiratory : Normal respiratory effort, good air movement bilaterally,clear to  auscultation bilaterally  8. Cardiovascular : RRR, no gallops, rubs or murmurs, no leg edema  9. Gastrointestinal:  Positive bowel sounds, abdomen soft, non-tender to palpation,no hepatosplenomegaly, no rigidity or guarding       10. Skin:  No cyanosis, normal texture and turgor, no rash, lesions or ulcers  11.Musculoskeletal:  Good muscle tone,  joints appear normal , no effusions,  normal range of motion    Data Review:    CBC  Recent Labs Lab 11/21/16 1250 11/27/16 2020  WBC  --  12.5*  HGB 9.6* 8.4*  HCT 29.9* 25.5*  PLT  --  236  MCV  --  99.6  MCH  --  32.8  MCHC  --  32.9  RDW  --  20.0*   ------------------------------------------------------------------------------------------------------------------  Chemistries   Recent Labs Lab 11/27/16 2020  NA 140  K 4.2  CL 108  CO2 25  GLUCOSE 132*  BUN 24*  CREATININE 1.39*  CALCIUM 8.7*  AST 20  ALT 11*  ALKPHOS 40  BILITOT 0.5   ------------------------------------------------------------------------------------------------------------------  ------------------------------------------------------------------------------------------------------------------ GFR: Estimated Creatinine Clearance: 46 mL/min (A) (by C-G formula based on SCr of 1.39 mg/dL (H)). Liver Function Tests:  Recent Labs Lab 11/27/16 2020  AST 20  ALT 11*  ALKPHOS 40  BILITOT 0.5  PROT 5.7*  ALBUMIN 3.7    ---------------------------------------------------------------------------------------------------------------    Imaging Results:    No results found.  My personal review of EKG: Rhythm - Atrial fibrillation   Assessment & Plan:    Active Problems:   Rectal bleeding   1. Rectal bleeding- likely from diverticular bleed versus post polypectomy bleed, started after  resuming anti-coagulation. We'll consult the strength only in a.m. Keep him nothing by mouth. 2. Anemia-secondary to above, hemoglobin is 8.4. 1 unit PRBC has been ordered in the ED. Follow CBC in a.m. 3. Polymyalgia rheumatica- continue prednisone 5 mg daily. 4. Atrial fibrillation-heart rate is controlled, continue Lopressor 25 mg daily. Hold Xarelto due to above. 5. Hypertension-will hold benazepril, due to soft blood pressures, continue metoprolol as above.   DVT Prophylaxis-   SCDs  AM Labs Ordered, also please review Full Orders  Family Communication: Admission, patients condition and plan of care including tests being ordered have been discussed with the patient  who indicate understanding and agree with the plan and Code Status.  Code Status:  Full code  Admission status: Observation    Time spent in minutes : 60 minutes   Niasia Lanphear S M.D on 11/27/2016 at 11:26 PM  Between 7am to 7pm - Pager - [phone]. After 7pm go to [email or web address] - password [facility]  [facility] - Office  [phone]

## 2016-11-27 NOTE — ED Notes (Addendum)
Alto Denver (pt's daughter) Cell [phone] Home [phone]

## 2016-11-27 NOTE — ED Triage Notes (Signed)
Pt c/o rectal bleeding that started today, pt reports that he had a colonoscopy performed recently, denies any pain,

## 2016-11-27 NOTE — ED Provider Notes (Signed)
[place] DEPT Provider Note   CSN: [id number] Arrival date & time: 11/27/16  1847     History   Chief Complaint Chief Complaint  Patient presents with  . Rectal Bleeding    HPI Kevin Carlson. is a 83 y.o. male.Complains of rectal bleeding. Onset this afternoon symptoms accompanied by lightheadedness. Nothing makes symptoms better or worse. He denies pain anywhere. No treatment prior to coming here. He states she's had 5 bloody bowel movements. Patient is status post colonoscopy and EGD 11/18/2016. He was subsequently's restarted on XARELTO  HPI  Past Medical History:  Diagnosis Date  . Anemia   . Arthritis   . BPH (benign prostatic hyperplasia)   . Cancer ([place])    Skin Cancer-   . Carpal tunnel syndrome   . Complication of anesthesia 2011   " bleeding from Esophagus after extubated, had to have an endo  . Constipation due to opioid therapy   . DDD (degenerative disc disease), cervical   . Dysrhythmia   . GERD (gastroesophageal reflux disease)   . History of hiatal hernia   . Hyperlipemia   . Hypertension   . Nasal congestion   . Neuropathy   . Osteopenia   . Polymyalgia rheumatica [facility])     Patient Active Problem List   Diagnosis Date Noted  . History of colonic polyps 10/12/2016  . Barrett's esophagus without dysplasia 10/12/2016  . Absolute anemia 10/12/2016  . Postoperative fever   . Cluster headache   . Neck pain   . Postoperative anemia due to acute blood loss 03/22/2015  . Sepsis due to undetermined organism ([place]) 03/21/2015  . Hypertension 03/21/2015  . Hyperlipidemia 03/21/2015  . Atrial fibrillation ([place]) 03/21/2015  . GERD (gastroesophageal reflux disease) 03/21/2015  . Polymyalgia rheumatica ([place]) 03/21/2015  . Chronic constipation 03/21/2015  . Lumbar spinal stenosis 03/20/2015    Past Surgical History:  Procedure Laterality Date  . APPENDECTOMY  1951  . BIOPSY  11/18/2016   Procedure: BIOPSY;  Surgeon: Rogene Houston, MD;   [facility];  Service: Endoscopy;;  esophagus  . COLON RESECTION  1951   after appendectomy  . COLONOSCOPY N/A 11/18/2016   Procedure: COLONOSCOPY;  Surgeon: Rogene Houston, MD;  [facility];  Service: Endoscopy;  Laterality: N/A;  . COLONOSCOPY W/ POLYPECTOMY    . ESOPHAGOGASTRODUODENOSCOPY N/A 11/18/2016   Procedure: ESOPHAGOGASTRODUODENOSCOPY (EGD);  Surgeon: Rogene Houston, MD;  [facility];  Service: Endoscopy;  Laterality: N/A;  12:10  . EYE SURGERY Bilateral    cataract  . FINGER SURGERY Right    index- pinned  . HERNIA REPAIR Right   . HERNIA REPAIR Right 1948  . KNEE SURGERY Right 1974   "cleaned it out"  . LUMBAR LAMINECTOMY/DECOMPRESSION MICRODISCECTOMY N/A 03/20/2015   Procedure: Decompressive lumbar laminectomy L3-L5 ;  Surgeon: Erline Levine, MD;  Location: [place] NEURO ORS;  Service: Neurosurgery;  Laterality: N/A;  . POLYPECTOMY  11/18/2016   Procedure: POLYPECTOMY;  Surgeon: Rogene Houston, MD;  [facility];  Service: Endoscopy;;  colon   . REPLACEMENT TOTAL KNEE Left 2011  . REPLACEMENT TOTAL KNEE Right 2006  . TONSILLECTOMY         Home Medications    Prior to Admission medications   Medication Sig Start Date End Date Taking? Authorizing Provider  benazepril (LOTENSIN) 10 MG tablet Take 10 mg by mouth daily.    [provider]  diphenhydramine-acetaminophen (TYLENOL PM) 25-500 MG TABS  tablet Take 1 tablet by mouth at bedtime.    [provider]  folic acid (FOLVITE) 1 MG tablet Take 1 mg by mouth daily.    [provider]  gabapentin (NEURONTIN) 800 MG tablet Take 800 mg by mouth 2 (two) times daily.    [provider]  HYDROcodone-acetaminophen (NORCO) 10-325 MG tablet Take 1 tablet by mouth daily as needed for moderate pain.     [provider]  ibuprofen (ADVIL,MOTRIN) 200 MG tablet Take 400 mg by mouth daily as needed for moderate pain.    [provider]    lansoprazole (PREVACID) 30 MG capsule Take 30 mg by mouth daily.     [provider]  methotrexate 2.5 MG tablet Take 12.5 mg by mouth every Saturday. 5 tabs a week     [provider]  metoprolol tartrate (LOPRESSOR) 25 MG tablet Take 25 mg by mouth daily.     [provider]  polyethylene glycol (MIRALAX / GLYCOLAX) packet Take 17 g by mouth daily as needed (constipation).     [provider]  predniSONE (DELTASONE) 5 MG tablet Take 5 mg by mouth daily with breakfast.    [provider]  rivaroxaban (XARELTO) 20 MG TABS tablet Take 1 tablet (20 mg total) by mouth daily. 11/22/16   Rogene Houston, MD  simvastatin (ZOCOR) 40 MG tablet Take 40 mg by mouth daily.    [provider]  tamsulosin (FLOMAX) 0.4 MG CAPS capsule Take 0.4 mg by mouth daily.     [provider]  tiZANidine (ZANAFLEX) 4 MG capsule Take 4 mg by mouth at bedtime.    [provider]    Family History No family history on file.  Social History Social History  Substance Use Topics  . Smoking status: Former Smoker    Years: 25.00    Quit date: 05/02/1974  . Smokeless tobacco: Never Used     Comment: quit smoking in 1976  . Alcohol use No     Allergies   Patient has no known allergies.   Review of Systems Review of Systems  Constitutional: Negative.   HENT: Negative.   Respiratory: Negative.   Cardiovascular: Negative.   Gastrointestinal: Positive for blood in stool.  Musculoskeletal: Negative.   Skin: Negative.   Neurological: Positive for light-headedness.  Hematological: Bruises/bleeds easily.  Psychiatric/Behavioral: Negative.   All other systems reviewed and are negative.    Physical Exam Updated Vital Signs BP 137/77 (BP Location: Right Arm)   Pulse 98   Temp 98 F (36.7 C) (Oral)   Resp 18   Ht 6\' 2"  (1.88 m)   Wt 86.2 kg (190 lb)   SpO2 100%   BMI 24.39 kg/m   Physical Exam  Constitutional: He is oriented to  person, place, and time. He appears well-developed and well-nourished. No distress.  HENT:  Head: Normocephalic and atraumatic.  Eyes: Pupils are equal, round, and reactive to light. Conjunctivae are normal.  Neck: Neck supple. No tracheal deviation present. No thyromegaly present.  Cardiovascular: Normal rate and regular rhythm.   No murmur heard. Pulmonary/Chest: Effort normal and breath sounds normal.  Abdominal: Soft. Bowel sounds are normal. He exhibits no distension. There is no tenderness.  Genitourinary:  Genitourinary Comments: Normal tone nontender grossly bloody maroon stool  Musculoskeletal: Normal range of motion. He exhibits no edema or tenderness.  Neurological: He is alert and oriented to person, place, and time. Coordination normal.  Skin: Skin is warm and  dry. No rash noted.  Psychiatric: He has a normal mood and affect.  Nursing note and vitals reviewed.    ED Treatments / Results  Labs (all labs ordered are listed, but only abnormal results are displayed) Labs Reviewed  COMPREHENSIVE METABOLIC PANEL  CBC  POC OCCULT BLOOD, ED  TYPE AND SCREEN    EKG  EKG Interpretation None       Radiology No results found.  Procedures Procedures (including critical care time)  Medications Ordered in ED Medications - No data to display Results for orders placed or performed during the hospital encounter of 11/27/16  Comprehensive metabolic panel  Result Value Ref Range   Sodium 140 135 - 145 mmol/L   Potassium 4.2 3.5 - 5.1 mmol/L   Chloride 108 101 - 111 mmol/L   CO2 25 22 - 32 mmol/L   Glucose, Bld 132 (H) 65 - 99 mg/dL   BUN 24 (H) 6 - 20 mg/dL   Creatinine, Ser 1.39 (H) 0.61 - 1.24 mg/dL   Calcium 8.7 (L) 8.9 - 10.3 mg/dL   Total Protein 5.7 (L) 6.5 - 8.1 g/dL   Albumin 3.7 3.5 - 5.0 g/dL   AST 20 15 - 41 U/L   ALT 11 (L) 17 - 63 U/L   Alkaline Phosphatase 40 38 - 126 U/L   Total Bilirubin 0.5 0.3 - 1.2 mg/dL   GFR calc non Af Amer 45 (L) >60 mL/min    GFR calc Af Amer 52 (L) >60 mL/min   Anion gap 7 5 - 15  CBC  Result Value Ref Range   WBC 12.5 (H) 4.0 - 10.5 K/uL   RBC 2.56 (L) 4.22 - 5.81 MIL/uL   Hemoglobin 8.4 (L) 13.0 - 17.0 g/dL   HCT 25.5 (L) 39.0 - 52.0 %   MCV 99.6 78.0 - 100.0 fL   MCH 32.8 26.0 - 34.0 pg   MCHC 32.9 30.0 - 36.0 g/dL   RDW 20.0 (H) 11.5 - 15.5 %   Platelets 236 150 - 400 K/uL  Type and screen [facility]  Result Value Ref Range   ABO/RH(D) A POS    Antibody Screen NEG    Sample Expiration 11/30/2016    No results found. Results for orders placed or performed during the hospital encounter of 11/27/16  Comprehensive metabolic panel  Result Value Ref Range   Sodium 140 135 - 145 mmol/L   Potassium 4.2 3.5 - 5.1 mmol/L   Chloride 108 101 - 111 mmol/L   CO2 25 22 - 32 mmol/L   Glucose, Bld 132 (H) 65 - 99 mg/dL   BUN 24 (H) 6 - 20 mg/dL   Creatinine, Ser 1.39 (H) 0.61 - 1.24 mg/dL   Calcium 8.7 (L) 8.9 - 10.3 mg/dL   Total Protein 5.7 (L) 6.5 - 8.1 g/dL   Albumin 3.7 3.5 - 5.0 g/dL   AST 20 15 - 41 U/L   ALT 11 (L) 17 - 63 U/L   Alkaline Phosphatase 40 38 - 126 U/L   Total Bilirubin 0.5 0.3 - 1.2 mg/dL   GFR calc non Af Amer 45 (L) >60 mL/min   GFR calc Af Amer 52 (L) >60 mL/min   Anion gap 7 5 - 15  CBC  Result Value Ref Range   WBC 12.5 (H) 4.0 - 10.5 K/uL   RBC 2.56 (L) 4.22 - 5.81 MIL/uL   Hemoglobin 8.4 (L) 13.0 - 17.0 g/dL   HCT 25.5 (L) 39.0 - 52.0 %  MCV 99.6 78.0 - 100.0 fL   MCH 32.8 26.0 - 34.0 pg   MCHC 32.9 30.0 - 36.0 g/dL   RDW 20.0 (H) 11.5 - 15.5 %   Platelets 236 150 - 400 K/uL  Type and screen [facility]  Result Value Ref Range   ABO/RH(D) A POS    Antibody Screen NEG    Sample Expiration 11/30/2016    No results found. Initial Impression / Assessment and Plan / ED Course  I have reviewed the triage vital signs and the nursing notes.  Pertinent labs & imaging results that were available during my care of the patient were reviewed by me and  considered in my medical decision making (see chart for details).     Dr. Darrick Meigs from hospital service consulted and will arrange for admission. I ordered emergent transfusion with 1 unit packed red cells to be transfused asthis patient has symptomatic anemia and is actively bleeding on blood thinners. Patient renal insufficiency which may be secondary to hypovolemia or GI bleeding  Final Clinical Impressions(s) / ED Diagnoses  Diagnoses #1 acute lower GI bleeding #2 symptomatic anemia #3 renal insufficiency Final diagnoses:  None  CRITICAL CARE Performed by: Orlie Dakin Total critical care time: 30 minutes Critical care time was exclusive of separately billable procedures and treating other patients. Critical care was necessary to treat or prevent imminent or life-threatening deterioration. Critical care was time spent personally by me on the following activities: development of treatment plan with patient and/or surrogate as well as nursing, discussions with consultants, evaluation of patient's response to treatment, examination of patient, obtaining history from patient or surrogate, ordering and performing treatments and interventions, ordering and review of laboratory studies, ordering and review of radiographic studies, pulse oximetry and re-evaluation of patient's condition. New Prescriptions New Prescriptions   No medications on file     Orlie Dakin, MD 11/27/16 2204

## 2016-11-28 ENCOUNTER — Encounter (HOSPITAL_COMMUNITY): Payer: Self-pay | Admitting: Gastroenterology

## 2016-11-28 DIAGNOSIS — M353 Polymyalgia rheumatica: Secondary | ICD-10-CM

## 2016-11-28 DIAGNOSIS — K922 Gastrointestinal hemorrhage, unspecified: Secondary | ICD-10-CM

## 2016-11-28 DIAGNOSIS — I10 Essential (primary) hypertension: Secondary | ICD-10-CM | POA: Diagnosis not present

## 2016-11-28 DIAGNOSIS — I482 Chronic atrial fibrillation: Secondary | ICD-10-CM | POA: Diagnosis not present

## 2016-11-28 LAB — CBC
HEMATOCRIT: 26.7 % — AB (ref 39.0–52.0)
Hemoglobin: 8.8 g/dL — ABNORMAL LOW (ref 13.0–17.0)
MCH: 32 pg (ref 26.0–34.0)
MCHC: 33 g/dL (ref 30.0–36.0)
MCV: 97.1 fL (ref 78.0–100.0)
Platelets: 207 10*3/uL (ref 150–400)
RBC: 2.75 MIL/uL — AB (ref 4.22–5.81)
RDW: 20.5 % — ABNORMAL HIGH (ref 11.5–15.5)
WBC: 10.6 10*3/uL — AB (ref 4.0–10.5)

## 2016-11-28 LAB — COMPREHENSIVE METABOLIC PANEL
ALT: 10 U/L — AB (ref 17–63)
AST: 16 U/L (ref 15–41)
Albumin: 3.3 g/dL — ABNORMAL LOW (ref 3.5–5.0)
Alkaline Phosphatase: 35 U/L — ABNORMAL LOW (ref 38–126)
Anion gap: 9 (ref 5–15)
BUN: 22 mg/dL — AB (ref 6–20)
CO2: 22 mmol/L (ref 22–32)
Calcium: 8.5 mg/dL — ABNORMAL LOW (ref 8.9–10.3)
Chloride: 109 mmol/L (ref 101–111)
Creatinine, Ser: 1.07 mg/dL (ref 0.61–1.24)
GFR calc Af Amer: >60 mL/min (ref >60)
Glucose, Bld: 98 mg/dL (ref 65–99)
Potassium: 4.5 mmol/L (ref 3.5–5.1)
Sodium: 140 mmol/L (ref 135–145)
TOTAL PROTEIN: 5.1 g/dL — AB (ref 6.5–8.1)
Total Bilirubin: 0.8 mg/dL (ref 0.3–1.2)

## 2016-11-28 MED ORDER — FOLIC ACID 1 MG PO TABS
1.0000 mg | ORAL_TABLET | Freq: Every day | ORAL | Status: DC
  Administered 2016-11-28 – 2016-11-29 (×2): 1 mg via ORAL
  Filled 2016-11-28 (×2): qty 1

## 2016-11-28 MED ORDER — ONDANSETRON HCL 4 MG PO TABS: 4.0000 mg | ORAL_TABLET | Freq: Four times a day (QID) | ORAL | Status: DC | PRN

## 2016-11-28 MED ORDER — TIZANIDINE HCL 4 MG PO TABS
4.0000 mg | ORAL_TABLET | Freq: Every day | ORAL | Status: DC
  Administered 2016-11-28: 4 mg via ORAL
  Filled 2016-11-28: qty 1

## 2016-11-28 MED ORDER — SIMVASTATIN 20 MG PO TABS
40.0000 mg | ORAL_TABLET | Freq: Every day | ORAL | Status: DC
  Administered 2016-11-28 – 2016-11-29 (×2): 40 mg via ORAL
  Filled 2016-11-28 (×2): qty 2

## 2016-11-28 MED ORDER — PREDNISONE 10 MG PO TABS
5.0000 mg | ORAL_TABLET | Freq: Every day | ORAL | Status: DC
  Administered 2016-11-28 – 2016-11-29 (×2): 5 mg via ORAL
  Filled 2016-11-28 (×2): qty 1

## 2016-11-28 MED ORDER — ONDANSETRON HCL 4 MG/2ML IJ SOLN: 4.0000 mg | Freq: Four times a day (QID) | INTRAMUSCULAR | Status: DC | PRN

## 2016-11-28 MED ORDER — METOPROLOL TARTRATE 25 MG PO TABS
25.0000 mg | ORAL_TABLET | Freq: Every day | ORAL | Status: DC
  Administered 2016-11-28 – 2016-11-29 (×2): 25 mg via ORAL
  Filled 2016-11-28 (×2): qty 1

## 2016-11-28 MED ORDER — HYDROCODONE-ACETAMINOPHEN 10-325 MG PO TABS: 1.0000 | ORAL_TABLET | Freq: Every day | ORAL | Status: DC | PRN

## 2016-11-28 MED ORDER — TAMSULOSIN HCL 0.4 MG PO CAPS
0.4000 mg | ORAL_CAPSULE | Freq: Every day | ORAL | Status: DC
  Administered 2016-11-28 – 2016-11-29 (×2): 0.4 mg via ORAL
  Filled 2016-11-28 (×2): qty 1

## 2016-11-28 MED ORDER — METHOTREXATE 2.5 MG PO TABS: 12.5000 mg | ORAL_TABLET | ORAL | Status: DC

## 2016-11-28 MED ORDER — GABAPENTIN 400 MG PO CAPS
800.0000 mg | ORAL_CAPSULE | Freq: Two times a day (BID) | ORAL | Status: DC
  Administered 2016-11-28 – 2016-11-29 (×3): 800 mg via ORAL
  Filled 2016-11-28 (×4): qty 2

## 2016-11-28 MED ORDER — PANTOPRAZOLE SODIUM 40 MG PO TBEC
40.0000 mg | DELAYED_RELEASE_TABLET | Freq: Every day | ORAL | Status: DC
  Administered 2016-11-28 – 2016-11-29 (×2): 40 mg via ORAL
  Filled 2016-11-28 (×2): qty 1

## 2016-11-28 NOTE — Consult Note (Signed)
Referring Provider: Dr. Darrick Meigs  Primary Care Physician:  Kevin Rad, MD Primary Gastroenterologist:  Kevin Carlson   Date of Admission: 11/27/16 Date of Consultation: 11/28/16  Reason for Consultation:    HPI:  Kevin Boule. is a 83 y.o. year old male presenting to the hospital with rectal bleeding in the setting of Xarelto; his history is significant for colonoscopy/EGD July 20th by Kevin Carlson that was performed secondary to IDA, with three 7 to 12 mm polyps removed piecemeal using hot snare, clips placed, diverticulosis in sigmoid colon. EGD showed Barrett's without dysplasia, normal stomach and duodenum. He states he accidentally took Xarelto prior to when he was supposed to resume and noted small amount of rectal bleeding. He contacted Dr. Olevia Perches office and was informed to start 7/27 instead of 7/24,  along with obtaining H/H. This was 9.6, which was down about a gram from day of colonoscopy. He presented to the ED yesterday with Hgb 8.4, receiving 1 unit early this morning. Post-transfusion H/H is 8.8.   States he was doing fine until yesterday with 4-5 episodes of large, bloody episodes that were red/black and with clots. Presented to the ED. Had 2 more episodes in the ED but none since arriving to the floor. No abdominal pain. No N/V. No further recta bleeding. Wants to go home. NPO.    Past Medical History:  Diagnosis Date  . Anemia   . Arthritis   . BPH (benign prostatic hyperplasia)   . Cancer ([place])    Skin Cancer-   . Carpal tunnel syndrome   . Complication of anesthesia 2011   " bleeding from Esophagus after extubated, had to have an endo  . Constipation due to opioid therapy   . DDD (degenerative disc disease), cervical   . Dysrhythmia   . GERD (gastroesophageal reflux disease)   . History of hiatal hernia   . Hyperlipemia   . Hypertension   . Nasal congestion   . Neuropathy   . Osteopenia   . Polymyalgia rheumatica ([facility])     Past Surgical History:  Procedure  Laterality Date  . APPENDECTOMY  1951  . BIOPSY  11/18/2016   Procedure: BIOPSY;  Surgeon: Rogene Houston, MD;  [facility];  Service: Endoscopy;;  esophagus  . COLON RESECTION  1951   after appendectomy  . COLONOSCOPY N/A 11/18/2016   Procedure: COLONOSCOPY;  Surgeon: Rogene Houston, MD;  [facility];  Service: Endoscopy;  Laterality: N/A;  . COLONOSCOPY W/ POLYPECTOMY    . ESOPHAGOGASTRODUODENOSCOPY N/A 11/18/2016   Procedure: ESOPHAGOGASTRODUODENOSCOPY (EGD);  Surgeon: Rogene Houston, MD;  [facility];  Service: Endoscopy;  Laterality: N/A;  12:10  . EYE SURGERY Bilateral    cataract  . FINGER SURGERY Right    index- pinned  . HERNIA REPAIR Right   . HERNIA REPAIR Right 1948  . KNEE SURGERY Right 1974   "cleaned it out"  . LUMBAR LAMINECTOMY/DECOMPRESSION MICRODISCECTOMY N/A 03/20/2015   Procedure: Decompressive lumbar laminectomy L3-L5 ;  Surgeon: Erline Levine, MD;  Location: [place] NEURO ORS;  Service: Neurosurgery;  Laterality: N/A;  . POLYPECTOMY  11/18/2016   Procedure: POLYPECTOMY;  Surgeon: Rogene Houston, MD;  [facility];  Service: Endoscopy;;  colon   . REPLACEMENT TOTAL KNEE Left 2011  . REPLACEMENT TOTAL KNEE Right 2006  . TONSILLECTOMY      Prior to Admission medications   Medication Sig Start Date End Date Taking? Authorizing Provider  benazepril (  LOTENSIN) 10 MG tablet Take 10 mg by mouth daily.   Yes [provider]  diphenhydramine-acetaminophen (TYLENOL PM) 25-500 MG TABS tablet Take 1 tablet by mouth at bedtime.   Yes [provider]  folic acid (FOLVITE) 1 MG tablet Take 1 mg by mouth daily.   Yes [provider]  gabapentin (NEURONTIN) 800 MG tablet Take 800 mg by mouth 2 (two) times daily.   Yes [provider]  HYDROcodone-acetaminophen (NORCO) 10-325 MG tablet Take 1 tablet by mouth daily as needed for moderate pain.    Yes [provider]  lansoprazole  (PREVACID) 30 MG capsule Take 30 mg by mouth daily.    Yes [provider]  methotrexate 2.5 MG tablet Take 12.5 mg by mouth every Saturday. 5 tabs a week    Yes [provider]  metoprolol tartrate (LOPRESSOR) 25 MG tablet Take 25 mg by mouth daily.    Yes [provider]  polyethylene glycol (MIRALAX / GLYCOLAX) packet Take 17 g by mouth daily as needed (constipation).    Yes [provider]  predniSONE (DELTASONE) 5 MG tablet Take 5 mg by mouth daily with breakfast.   Yes [provider]  rivaroxaban (XARELTO) 20 MG TABS tablet Take 1 tablet (20 mg total) by mouth daily. 11/22/16  Yes Rehman, Mechele Dawley, MD  simvastatin (ZOCOR) 40 MG tablet Take 40 mg by mouth daily.   Yes [provider]  tamsulosin (FLOMAX) 0.4 MG CAPS capsule Take 0.4 mg by mouth daily.    Yes [provider]  tiZANidine (ZANAFLEX) 4 MG capsule Take 4 mg by mouth at bedtime.   Yes [provider]    Current Facility-Administered Medications  Medication Dose Route Frequency Provider Last Rate Last Dose  . 0.9 %  sodium chloride infusion  10 mL/hr Intravenous Once Orlie Dakin, MD   Stopped at 11/28/16 [phone]  . folic acid (FOLVITE) tablet 1 mg  1 mg Oral Daily [place], Marge Duncans, MD      . gabapentin (NEURONTIN) capsule 800 mg  800 mg Oral BID Oswald Hillock, MD      . HYDROcodone-acetaminophen (NORCO) 10-325 MG per tablet 1 tablet  1 tablet Oral Daily PRN Oswald Hillock, MD      . Derrill Memo ON 12/03/2016] methotrexate (RHEUMATREX) tablet 12.5 mg  12.5 mg Oral Q Sat [place], Marge Duncans, MD      . metoprolol tartrate (LOPRESSOR) tablet 25 mg  25 mg Oral Daily Darrick Meigs, Marge Duncans, MD      . ondansetron [facility]) tablet 4 mg  4 mg Oral Q6H PRN Oswald Hillock, MD       Or  . ondansetron (ZOFRAN) injection 4 mg  4 mg Intravenous Q6H PRN Oswald Hillock, MD      . predniSONE (DELTASONE) tablet 5 mg  5 mg Oral Q breakfast Oswald Hillock, MD   5 mg at 11/28/16 0831  . simvastatin (ZOCOR)  tablet 40 mg  40 mg Oral Daily [place], Marge Duncans, MD      . tamsulosin (FLOMAX) capsule 0.4 mg  0.4 mg Oral Daily Darrick Meigs, Marge Duncans, MD      . tiZANidine (ZANAFLEX) tablet 4 mg  4 mg Oral QHS Oswald Hillock, MD        Allergies as of 11/27/2016  . (No Known Allergies)    No family history on file.  Social History   Social History  . Marital status: Widowed  Spouse name: N/A  . Number of children: N/A  . Years of education: N/A   Occupational History  . Not on file.   Social History Main Topics  . Smoking status: Former Smoker    Years: 25.00    Quit date: 05/02/1974  . Smokeless tobacco: Never Used     Comment: quit smoking in 1976  . Alcohol use No  . Drug use: No  . Sexual activity: Not on file   Other Topics Concern  . Not on file   Social History Narrative  . No narrative on file    Review of Systems: Gen: Denies fever, chills, loss of appetite, change in weight or weight loss CV: Denies chest pain, heart palpitations, syncope, edema  Resp: Denies shortness of breath with rest, cough, wheezing GI: see HPI  GU : Denies urinary burning, urinary frequency, urinary incontinence.  MS: Denies joint pain,swelling, cramping Derm: Denies rash, itching, dry skin Psych: Denies depression, anxiety,confusion, or memory loss Heme: see HPI   Physical Exam: Vital signs in last 24 hours: Temp:  [98 F (36.7 C)-98.8 F (37.1 C)] 98.3 F (36.8 C) (07/30 0605) Pulse Rate:  [69-100] 89 (07/30 0605) Resp:  [10-24] 16 (07/30 0605) BP: (111-160)/(64-77) 111/75 (07/30 0605) SpO2:  [94 %-100 %] 97 % (07/30 0742) Weight:  [180 lb (81.6 kg)-190 lb (86.2 kg)] 180 lb (81.6 kg) (07/30 0138) Last BM Date: 11/27/16 General:   Alert,  Well-developed, well-nourished, pleasant and cooperative in NAD Head:  Normocephalic and atraumatic. Eyes:  Sclera clear, no icterus.   Conjunctiva pink. Ears:  Normal auditory acuity. Nose:  No deformity, discharge,  or lesions. Mouth:  No deformity or  lesions, dentition normal. Lungs:  Clear throughout to auscultation.   Heart:  S1 S2 present Abdomen:  Soft, nontender and nondistended. No masses, hepatosplenomegaly or hernias noted. Normal bowel sounds, without guarding, and without rebound.   Rectal:  Deferred  Msk:  Symmetrical without gross deformities. Normal posture. Extremities:  Without edema. Neurologic:  Alert and  oriented x4 Psych:  Alert and cooperative. Normal mood and affect.  Intake/Output from previous day: 07/29 0701 - 07/30 0700 In: 1420 [I.V.:10; Blood:910; IV Piggyback:500] Out: -  Intake/Output this shift: No intake/output data recorded.  Lab Results:  Recent Labs  11/27/16 2020 11/28/16 0737  WBC 12.5* 10.6*  HGB 8.4* 8.8*  HCT 25.5* 26.7*  PLT 236 207   BMET  Recent Labs  11/27/16 2020 11/28/16 0737  NA 140 140  K 4.2 4.5  CL 108 109  CO2 25 22  GLUCOSE 132* 98  BUN 24* 22*  CREATININE 1.39* 1.07  CALCIUM 8.7* 8.5*   LFT  Recent Labs  11/27/16 2020 11/28/16 0737  PROT 5.7* 5.1*  ALBUMIN 3.7 3.3*  AST 20 16  ALT 11* 10*  ALKPHOS 40 35*  BILITOT 0.5 0.8    Impression: 83 year old male with likely post-polypectomy bleed in the setting of Xarelto, now without further bleeding since admission. Received 1 unit PRBCs, with Hgb 8.8 this morning. Upon review of records, colonoscopy/EGD performed 7/20 due to IDA, and it appears his Hgb was 9.7 in May of this year prior to any procedures. Clinically has improved since admission. No endoscopic intervention needed unless persistent bleeding, transfusion-dependent anemia. Continue to hold Xarelto for now. Dr Laural Carlson to resume care on 7/31. Will order low-residue diet in the interim.   Plan: Monitor for further overt bleeding Recheck CBC in am Low-residue diet Kevin Carlson to resume care on  7/31 Continue to hold Xarelto Add PPI once daily: history of Barrett's   Annitta Needs, PhD, ANP-BC [facility] Gastroenterology     LOS: 0 days     11/28/2016, 9:15 AM

## 2016-11-28 NOTE — Care Management Obs Status (Signed)
[place] NOTIFICATION   Patient Details  Name: Kevin Carlson. MRN: [id number] Date of Birth: 08-20-1929   Medicare Observation Status Notification Given:  Yes    Zarius Furr, Chauncey Reading, RN 11/28/2016, 11:30 AM

## 2016-11-28 NOTE — Progress Notes (Signed)
[facility] PROGRESS NOTE    Progress Note  Kevin Carlson.  XBL:[id number] DOB: 06/21/1929 DOA: 11/27/2016 PCP: Kennieth Rad, MD     Brief Narrative:   Kevin Traynham. is an 83 y.o. male past medical history BPH, chronic atrial fibrillation on Xarelto, polymyalgia rheumatica with the last endoscopy in 11/18/2016 that showed multiple polyps which were removed and multiple diverticuli. He hit 6 bloody bowel movements a day prior to admission he denies any syncopal episodes vomiting diarrhea abdominal pain or chest pain.  Assessment/Plan:   Lower GI bleed/Rectal bleeding: Likely diverticular in nature and/or due to recent polypectomy. Hemoglobin on 11/18/2016 was 10.8, on admission is 8.4. Type and screen, GI consult.  Essential hypertension Blood pressure at goal continue to monitor.  Antihypertensive medication held Blood pressure was soft and aspirin was held. Continue metoprolol.    Atrial fibrillation, chronic (HCC) Continue hold  Xarelto.  Polymyalgia rheumatica (HCC) Continue steroids.  DVT prophylaxis: SCD Family Communication:none Disposition Plan/Barrier to D/C: home in am Code Status:     Code Status Orders        Start     Ordered   11/28/16 0219  Full code  Continuous     11/28/16 0218    Code Status History    Date Active Date Inactive Code Status Order ID Comments User Context   03/21/2015 10:50 PM 03/25/2015  3:52 PM Full Code [id number]  Reubin Milan, MD Inpatient   03/20/2015  2:35 PM 03/21/2015  1:20 PM Full Code [id number]  Erline Levine, MD Inpatient        IV Access:    Peripheral IV   Procedures and diagnostic studies:   No results found.   Medical Consultants:    None.  Anti-Infectives:   none  Subjective:    Kevin Carlson. no new complaints, no bloody bowel movements overnight.  Objective:    Vitals:   11/28/16 0047 11/28/16 0138 11/28/16 0333 11/28/16 0605  BP: 132/70 (!) 160/68 117/64  111/75  Pulse: 78 74 69 89  Resp: 13 18 16 16   Temp: 98 F (36.7 C) 98.3 F (36.8 C) 98.8 F (37.1 C) 98.3 F (36.8 C)  TempSrc: Oral Oral Oral Oral  SpO2: 99% 100% 100% 100%  Weight:  81.6 kg (180 lb)    Height:  6\' 2"  (1.88 m)      Intake/Output Summary (Last 24 hours) at 11/28/16 0732 Last data filed at 11/28/16 0333  Gross per 24 hour  Intake             1420 ml  Output                0 ml  Net             1420 ml   Filed Weights   11/27/16 1900 11/28/16 0138  Weight: 86.2 kg (190 lb) 81.6 kg (180 lb)    Exam: General exam: In no acute distress Respiratory system: Good air movement and clear to auscultation.  Cardiovascular system:  regular rate and rhythm with positive S1-S2. Gastrointestinal system:  abdomen is soft nondistended nontender nondistended. Central nervous system: awake alert and oriented 3.  Extremities: no extremity edema.  Skin: No rashes.  Psychiatry:  judgment and insight appear normal.   Data Reviewed:    Labs: Basic Metabolic Panel:  Recent Labs Lab 11/27/16 2020  NA 140  K 4.2  CL 108  CO2 25  GLUCOSE 132*  BUN 24*  CREATININE 1.39*  CALCIUM 8.7*   GFR Estimated Creatinine Clearance: 45.7 mL/min (A) (by C-G formula based on SCr of 1.39 mg/dL (H)). Liver Function Tests:  Recent Labs Lab 11/27/16 2020  AST 20  ALT 11*  ALKPHOS 40  BILITOT 0.5  PROT 5.7*  ALBUMIN 3.7   No results for input(s): LIPASE, AMYLASE in the last 168 hours. No results for input(s): AMMONIA in the last 168 hours. Coagulation profile No results for input(s): INR, PROTIME in the last 168 hours.  CBC:  Recent Labs Lab 11/21/16 1250 11/27/16 2020  WBC  --  12.5*  HGB 9.6* 8.4*  HCT 29.9* 25.5*  MCV  --  99.6  PLT  --  236   Cardiac Enzymes: No results for input(s): CKTOTAL, CKMB, CKMBINDEX, TROPONINI in the last 168 hours. BNP (last 3 results) No results for input(s): PROBNP in the last 8760 hours. CBG: No results for input(s):  GLUCAP in the last 168 hours. D-Dimer: No results for input(s): DDIMER in the last 72 hours. Hgb A1c: No results for input(s): HGBA1C in the last 72 hours. Lipid Profile: No results for input(s): CHOL, HDL, LDLCALC, TRIG, CHOLHDL, LDLDIRECT in the last 72 hours. Thyroid function studies: No results for input(s): TSH, T4TOTAL, T3FREE, THYROIDAB in the last 72 hours.  Invalid input(s): FREET3 Anemia work up: No results for input(s): VITAMINB12, FOLATE, FERRITIN, TIBC, IRON, RETICCTPCT in the last 72 hours. Sepsis Labs:  Recent Labs Lab 11/27/16 2020  WBC 12.5*   Microbiology No results found for this or any previous visit (from the past 240 hour(s)).   Medications:   . folic acid  1 mg Oral Daily  . gabapentin  800 mg Oral BID  . [START ON 12/03/2016] methotrexate  12.5 mg Oral Q Sat  . metoprolol tartrate  25 mg Oral Daily  . predniSONE  5 mg Oral Q breakfast  . simvastatin  40 mg Oral Daily  . tamsulosin  0.4 mg Oral Daily  . tiZANidine  4 mg Oral QHS   Continuous Infusions: . sodium chloride Stopped (11/28/16 0333)     LOS: 0 days   Kevin Carlson  [facility] Pager [phone]  *Please refer to [email or web address], password [email or web address] to get updated schedule on who will round on this patient, as hospitalists switch teams weekly. If 7PM-7AM, please contact night-coverage at [email or web address], password [email or web address] for any overnight needs.  11/28/2016, 7:32 AM

## 2016-11-29 ENCOUNTER — Encounter (HOSPITAL_COMMUNITY): Payer: Self-pay | Admitting: *Deleted

## 2016-11-29 DIAGNOSIS — K625 Hemorrhage of anus and rectum: Secondary | ICD-10-CM | POA: Diagnosis not present

## 2016-11-29 LAB — HEMOGLOBIN AND HEMATOCRIT, BLOOD
HCT: 28 % — ABNORMAL LOW (ref 39.0–52.0)
Hemoglobin: 9.3 g/dL — ABNORMAL LOW (ref 13.0–17.0)

## 2016-11-29 LAB — CBC
HEMATOCRIT: 23.4 % — AB (ref 39.0–52.0)
HEMOGLOBIN: 7.6 g/dL — AB (ref 13.0–17.0)
MCH: 31.7 pg (ref 26.0–34.0)
MCHC: 32.5 g/dL (ref 30.0–36.0)
MCV: 97.5 fL (ref 78.0–100.0)
Platelets: 215 10*3/uL (ref 150–400)
RBC: 2.4 MIL/uL — ABNORMAL LOW (ref 4.22–5.81)
RDW: 20.6 % — AB (ref 11.5–15.5)
WBC: 9.1 10*3/uL (ref 4.0–10.5)

## 2016-11-29 LAB — PREPARE RBC (CROSSMATCH)

## 2016-11-29 MED ORDER — DIPHENHYDRAMINE HCL 25 MG PO CAPS
25.0000 mg | ORAL_CAPSULE | Freq: Once | ORAL | Status: AC
  Administered 2016-11-29: 25 mg via ORAL
  Filled 2016-11-29: qty 1

## 2016-11-29 MED ORDER — FUROSEMIDE 10 MG/ML IJ SOLN
20.0000 mg | Freq: Once | INTRAMUSCULAR | Status: AC
  Administered 2016-11-29: 20 mg via INTRAVENOUS
  Filled 2016-11-29: qty 2

## 2016-11-29 MED ORDER — ACETAMINOPHEN 325 MG PO TABS
650.0000 mg | ORAL_TABLET | Freq: Once | ORAL | Status: AC
  Administered 2016-11-29: 650 mg via ORAL
  Filled 2016-11-29: qty 2

## 2016-11-29 MED ORDER — SODIUM CHLORIDE 0.9 % IV SOLN
Freq: Once | INTRAVENOUS | Status: AC
  Administered 2016-11-29: 11:00:00 via INTRAVENOUS

## 2016-11-29 NOTE — Progress Notes (Signed)
Patient discharged home with personal belongings and IV removed and site intact.

## 2016-11-29 NOTE — Discharge Summary (Signed)
Physician Discharge Summary  Kevin Carlson. BZJ:[id number] DOB: 1929-07-26 DOA: 11/27/2016  PCP: Kennieth Rad, MD  Admit date: 11/27/2016 Discharge date: 11/29/2016  Admitted From: Home.  Disposition:  Home.   Recommendations for Outpatient Follow-up:  1. Follow up with PCP in 1-2 weeks 2. Follow up with Dr Dereck Leep as scheduled.   Home Health: None.  Equipment/Devices: none.  Discharge Condition: No further rectal bleed.  Able to eat.  Hb of 9 g per dL.  CODE STATUS: FULL CODE.  Diet recommendation: Cardiac.   Brief/Interim Summary:  Patient was admitted for lower GI bleed after polypectomy in the setting of being on anticoagulant, by Dr Darrick Meigs on November 27, 2016.  As per his H and P:  "  Kevin Carlson  is a 83 y.o. male, With history of BPH, atrial fibrillation on chronic anticoagulation with Coumadin, polymyalgia rheumatica, hypertension who underwent colonoscopy on 11/18/2016, which showed polyps, which  were removed and also showed multiple diverticula in sigmoid colon. Patient says that he was doing fine and had normal bowel movements till he  started taking Xarelto. Since he started Xarelto patient started having bloody bowel movements today. He had 6 bloody bowel movements per day and felt dizzy. He did not pass out. Did not have chest pain or shortness of breath. No nausea vomiting or diarrhea. No dysuria. He denies abdominal pain.  In the ED he was found to have hemoglobin of 8.4, 1 unit of PRBC has been ordered for transfusion.  HOSPITAL COURSE:  Patient was admitted and his Hb was followed.  Dr Dereck Leep saw him, and recommended that he be transfused 1 unit of PRBC.  This was accomplished and brought his Hb to 9 g per dL.  He feels well, and was given his diet.  Dr Dereck Leep felt comfortable and recommended that his Xarelto be resumed.  I noticed that he is on chronic steroid and anticoagulation, and warned him about the risk of GI bleed.  He must take his steroid with food, and always be  on PPI prophylaxis.  He will see his PCP next week, and see Dr Dereck Leep in one week.  Thank you for asking me to participate in his care.  Good Day.   Discharge Diagnoses:  Active Problems:   Essential hypertension   Atrial fibrillation, chronic (HCC)   Polymyalgia rheumatica (HCC)   Chronic constipation   Rectal bleeding   Lower GI bleed  Discharge Instructions  Discharge Instructions    Diet - low sodium heart healthy    Complete by:  As directed    Discharge instructions    Complete by:  As directed    Take your medication as instructed.  You should see Dr Dereck Leep next week.  Watch and be sure you are not bleeding anymore.   Increase activity slowly    Complete by:  As directed      Allergies as of 11/29/2016   No Known Allergies     Medication List    STOP taking these medications   diphenhydramine-acetaminophen 25-500 MG Tabs tablet Commonly known as:  TYLENOL PM     TAKE these medications   benazepril 10 MG tablet Commonly known as:  LOTENSIN Take 10 mg by mouth daily.   folic acid 1 MG tablet Commonly known as:  FOLVITE Take 1 mg by mouth daily.   gabapentin 800 MG tablet Commonly known as:  NEURONTIN Take 800 mg by mouth 2 (two) times daily.   HYDROcodone-acetaminophen 10-325 MG tablet  Commonly known as:  NORCO Take 1 tablet by mouth daily as needed for moderate pain.   lansoprazole 30 MG capsule Commonly known as:  PREVACID Take 30 mg by mouth daily.   methotrexate 2.5 MG tablet Take 12.5 mg by mouth every Saturday. 5 tabs a week   metoprolol tartrate 25 MG tablet Commonly known as:  LOPRESSOR Take 25 mg by mouth daily.   polyethylene glycol packet Commonly known as:  MIRALAX / GLYCOLAX Take 17 g by mouth daily as needed (constipation).   predniSONE 5 MG tablet Commonly known as:  DELTASONE Take 5 mg by mouth daily with breakfast.   rivaroxaban 20 MG Tabs tablet Commonly known as:  XARELTO Take 1 tablet (20 mg total) by mouth daily.    simvastatin 40 MG tablet Commonly known as:  ZOCOR Take 40 mg by mouth daily.   tamsulosin 0.4 MG Caps capsule Commonly known as:  FLOMAX Take 0.4 mg by mouth daily.   tiZANidine 4 MG capsule Commonly known as:  ZANAFLEX Take 4 mg by mouth at bedtime.       No Known Allergies  Consultations:  GI consult.    Procedures/Studies:  No results found.  Subjective: I am ready to go home.   Discharge Exam: Vitals:   11/29/16 1341 11/29/16 1519  BP: (!) 100/55 (!) 100/52  Pulse: (!) 58 (!) 42  Resp: 18 20  Temp: 98 F (36.7 C) 98.3 F (36.8 C)   Vitals:   11/29/16 1107 11/29/16 1134 11/29/16 1341 11/29/16 1519  BP: (!) 122/53 (!) 128/57 (!) 100/55 (!) 100/52  Pulse: 80 78 (!) 58 (!) 42  Resp: 18 18 18 20   Temp: 98.2 F (36.8 C) 98.6 F (37 C) 98 F (36.7 C) 98.3 F (36.8 C)  TempSrc:      SpO2: 100% 100% 100% (!) 86%  Weight:      Height:        General: Pt is alert, awake, not in acute distress Cardiovascular: RRR, S1/S2 +, no rubs, no gallops Respiratory: CTA bilaterally, no wheezing, no rhonchi Abdominal: Soft, NT, ND, bowel sounds + Extremities: no edema, no cyanosis    The results of significant diagnostics from this hospitalization (including imaging, microbiology, ancillary and laboratory) are listed below for reference.     Basic Metabolic Panel:  Recent Labs Lab 11/27/16 2020 11/28/16 0737  NA 140 140  K 4.2 4.5  CL 108 109  CO2 25 22  GLUCOSE 132* 98  BUN 24* 22*  CREATININE 1.39* 1.07  CALCIUM 8.7* 8.5*   Liver Function Tests:  Recent Labs Lab 11/27/16 2020 11/28/16 0737  AST 20 16  ALT 11* 10*  ALKPHOS 40 35*  BILITOT 0.5 0.8  PROT 5.7* 5.1*  ALBUMIN 3.7 3.3*    Recent Labs Lab 11/27/16 2020 11/28/16 0737 11/29/16 0601 11/29/16 1433  WBC 12.5* 10.6* 9.1  --   HGB 8.4* 8.8* 7.6* 9.3*  HCT 25.5* 26.7* 23.4* 28.0*  MCV 99.6 97.1 97.5  --   PLT 236 207 215  --    Urinalysis    Component Value Date/Time    COLORURINE YELLOW 03/21/2015 1950   APPEARANCEUR CLEAR 03/21/2015 1950   LABSPEC 1.013 03/21/2015 1950   PHURINE 6.5 03/21/2015 1950   GLUCOSEU NEGATIVE 03/21/2015 1950   HGBUR NEGATIVE 03/21/2015 1950   [place] NEGATIVE 03/21/2015 1950   [place] NEGATIVE 03/21/2015 1950   PROTEINUR NEGATIVE 03/21/2015 1950   NITRITE NEGATIVE 03/21/2015 1950   LEUKOCYTESUR NEGATIVE 03/21/2015  1950    Time coordinating discharge: Over 30 minutes SIGNED:  Orvan Falconer, MD FACP [facility] 11/29/2016, 4:01 PM   If 7PM-7AM, please contact night-coverage [email or web address] Password [email or web address]

## 2016-11-29 NOTE — Progress Notes (Signed)
  Subjective:  Patient has no complaints. He had small bowel movement around 3 AM when he passed black stool. He did not see any fresh blood. He denies nausea vomiting or abdominal pain. He also denies shortness of breath or chest pain.  Objective: Blood pressure 117/71, pulse 62, temperature 98 F (36.7 C), temperature source Oral, resp. rate 20, height 6\' 2"  (1.88 m), weight 180 lb (81.6 kg), SpO2 99 %. Patient is alert and in no acute distress. He appears pale. Cardiac exam with regular rhythm normal S1 and S2. Lungs are clear to auscultation. Abdomen is symmetrical soft and nontender without organomegaly or masses. No LE edema noted.  Labs/studies Results:   Recent Labs  11/27/16 2020 11/28/16 0737 11/29/16 0601  WBC 12.5* 10.6* 9.1  HGB 8.4* 8.8* 7.6*  HCT 25.5* 26.7* 23.4*  PLT 236 207 215    BMET   Recent Labs  11/27/16 2020 11/28/16 0737  NA 140 140  K 4.2 4.5  CL 108 109  CO2 25 22  GLUCOSE 132* 98  BUN 24* 22*  CREATININE 1.39* 1.07  CALCIUM 8.7* 8.5*    LFT   Recent Labs  11/27/16 2020 11/28/16 0737  PROT 5.7* 5.1*  ALBUMIN 3.7 3.3*  AST 20 16  ALT 11* 10*  ALKPHOS 40 35*  BILITOT 0.5 0.8      Assessment:  #1.Post polypectomy bleed in the setting of anticoagulation. Last Rivaroxaban was 48 hours ago. He has received one unit of PRBCs and hemoglobin has dropped again. Drop in hemoglobin appears to be due to hemodilution. Clinically he does not appear to be actively bleeding. #2. Anemia secondary to GI bleed. He also has anemia of chronic disease and runs low hemoglobin. #3. GERD complicated by Barrett's esophagus. GERD symptoms are well controlled. #4. Atrial fibrillation with controlled ventricular rate.   Recommendations:  Will transfuse another unit of PRBCs today. He will be premedicated with acetaminophen and Benadryl. Furosemide 20 mg IV prior to transfusion. Posttransfusion H&H. If he remains stable he should be able to go home  this afternoon. Hold anticoagulation for another 5 days.

## 2016-11-30 LAB — BPAM RBC
Blood Product Expiration Date: 201808112359
Blood Product Expiration Date: 201808112359
ISSUE DATE / TIME: 201807300013
ISSUE DATE / TIME: 201807311108
Unit Type and Rh: 6200
Unit Type and Rh: 6200

## 2016-11-30 LAB — TYPE AND SCREEN
ABO/RH(D): A POS
ANTIBODY SCREEN: NEGATIVE
UNIT DIVISION: 0
Unit division: 0

## 2016-12-06 ENCOUNTER — Encounter (INDEPENDENT_AMBULATORY_CARE_PROVIDER_SITE_OTHER): Payer: Self-pay | Admitting: Internal Medicine

## 2016-12-06 ENCOUNTER — Ambulatory Visit (INDEPENDENT_AMBULATORY_CARE_PROVIDER_SITE_OTHER): Payer: Medicare Other | Admitting: Internal Medicine

## 2016-12-06 VITALS — BP 120/50 | HR 72 | Temp 98.1°F | Ht 74.0 in | Wt 185.7 lb

## 2016-12-06 DIAGNOSIS — K922 Gastrointestinal hemorrhage, unspecified: Secondary | ICD-10-CM

## 2016-12-06 LAB — CBC WITH DIFFERENTIAL/PLATELET
BASOS ABS: 0 {cells}/uL (ref 0–200)
BASOS PCT: 0 %
EOS ABS: 101 {cells}/uL (ref 15–500)
Eosinophils Relative: 1 %
HCT: 25.8 % — ABNORMAL LOW (ref 38.5–50.0)
HEMOGLOBIN: 8.4 g/dL — AB (ref 13.2–17.1)
LYMPHS ABS: 1313 {cells}/uL (ref 850–3900)
Lymphocytes Relative: 13 %
MCH: 31.1 pg (ref 27.0–33.0)
MCHC: 32.6 g/dL (ref 32.0–36.0)
MCV: 95.6 fL (ref 80.0–100.0)
MONOS PCT: 9 %
MPV: 9 fL (ref 7.5–12.5)
Monocytes Absolute: 909 cells/uL (ref 200–950)
NEUTROS ABS: 7777 {cells}/uL (ref 1500–7800)
Neutrophils Relative %: 77 %
PLATELETS: 303 10*3/uL (ref 140–400)
RBC: 2.7 MIL/uL — ABNORMAL LOW (ref 4.20–5.80)
RDW: 18.8 % — AB (ref 11.0–15.0)
WBC: 10.1 10*3/uL (ref 3.8–10.8)

## 2016-12-06 NOTE — Patient Instructions (Signed)
CBC. OV in 1 year. 

## 2016-12-06 NOTE — Progress Notes (Addendum)
Subjective:    Patient ID: Kevin Holms., male    DOB: 12-04-1929, 83 y.o.   MRN: [id number]  HPI Here today for f/u. Underwent an EGD in Ju.y of this year for f/u of Barrett's esophagus. Impression:               - Normal proximal esophagus and mid esophagus.                           - Esophageal mucosal changes secondary to                            established short-segment Barrett's disease.                            Biopsied.                           - GEJ at 38 cm and hiatus at 42 cm.                           - 4 cm hiatal hernia.                           - Normal stomach.                           - Normal duodenal bulb and second portion of the                            duodenum. Also underwent a colonoscopy for heme positive stools.  Impression:               - Three 7 to 12 mm polyps (cluster) in the cecum,                            removed piecemeal using a hot snare. Complete                            resection. Partial retrieval. Clips (MR                            conditional) were placed.                           - Diverticulosis in the sigmoid colon.                           - External hemorrhoids.   Biopsy results reviewed with patient and his daughter.. Esophageal biopsy shows Barrett's without dysplasia. Ascending colon polyp is tubular adenoma without dysplasia. Report to PCP. No plans for another colonoscopy given patient's age.   Hx of post polypectomy bleed in the setting of anticoagulation.  Admitted to [facility] 11/27/2016 with c/o rectal bleeding. Admitted and received two units of blood. He tells me he has not seen any blood.  He feels woozy and weak especially if he bend his over.  His appetite is good. No weight loss.  Hx of atrial fib and maintained on Xarelto. Has not taken Xarelto in a week since discharge from hospital.    CBC Latest Ref Rng & Units 11/29/2016 11/29/2016 11/28/2016  WBC 4.0 - 10.5 K/uL - 9.1 10.6(H)  Hemoglobin 13.0 -  17.0 g/dL 9.3(L) 7.6(L) 8.8(L)  Hematocrit 39.0 - 52.0 % 28.0(L) 23.4(L) 26.7(L)  Platelets 150 - 400 K/uL - 215 207      Review of Systems  Past Medical History:  Diagnosis Date  . Anemia   . Arthritis   . BPH (benign prostatic hyperplasia)   . Cancer ([place])    Skin Cancer-   . Carpal tunnel syndrome   . Complication of anesthesia 2011   " bleeding from Esophagus after extubated, had to have an endo  . Constipation due to opioid therapy   . DDD (degenerative disc disease), cervical   . Dysrhythmia   . GERD (gastroesophageal reflux disease)   . History of hiatal hernia   . Hyperlipemia   . Hypertension   . Nasal congestion   . Neuropathy   . Osteopenia   . Polymyalgia rheumatica (HCC)     Past Surgical History:  Procedure Laterality Date  . APPENDECTOMY  1951  . BIOPSY  11/18/2016   Procedure: BIOPSY;  Surgeon: Rogene Houston, MD;  Location: [facility];  Service: Endoscopy;;  esophagus  . COLON RESECTION  1951   after appendectomy  . COLONOSCOPY N/A 11/18/2016   Procedure: COLONOSCOPY;  Surgeon: Rogene Houston, MD;  Location: [facility];  Service: Endoscopy;  Laterality: N/A;  . COLONOSCOPY W/ POLYPECTOMY    . ESOPHAGOGASTRODUODENOSCOPY N/A 11/18/2016   Procedure: ESOPHAGOGASTRODUODENOSCOPY (EGD);  Surgeon: Rogene Houston, MD;  Location: [facility];  Service: Endoscopy;  Laterality: N/A;  12:10  . EYE SURGERY Bilateral    cataract  . FINGER SURGERY Right    index- pinned  . HERNIA REPAIR Right   . HERNIA REPAIR Right 1948  . KNEE SURGERY Right 1974   "cleaned it out"  . LUMBAR LAMINECTOMY/DECOMPRESSION MICRODISCECTOMY N/A 03/20/2015   Procedure: Decompressive lumbar laminectomy L3-L5 ;  Surgeon: Erline Levine, MD;  Location: [facility]URO ORS;  Service: Neurosurgery;  Laterality: N/A;  . POLYPECTOMY  11/18/2016   Procedure: POLYPECTOMY;  Surgeon: Rogene Houston, MD;  Location: [facility];  Service: Endoscopy;;  colon   . REPLACEMENT TOTAL KNEE Left  2011  . REPLACEMENT TOTAL KNEE Right 2006  . TONSILLECTOMY      No Known Allergies  Current Outpatient Prescriptions on File Prior to Visit  Medication Sig Dispense Refill  . folic acid (FOLVITE) 1 MG tablet Take 1 mg by mouth daily.    Marland Kitchen gabapentin (NEURONTIN) 800 MG tablet Take 800 mg by mouth 2 (two) times daily.    Marland Kitchen HYDROcodone-acetaminophen (NORCO) 10-325 MG tablet Take 1 tablet by mouth daily as needed for moderate pain.     Marland Kitchen lansoprazole (PREVACID) 30 MG capsule Take 30 mg by mouth daily.     . methotrexate 2.5 MG tablet Take 12.5 mg by mouth every Saturday. 5 tabs a week     . metoprolol tartrate (LOPRESSOR) 25 MG tablet Take 25 mg by mouth daily.     . polyethylene glycol (MIRALAX / GLYCOLAX) packet Take 17 g by mouth daily as needed (constipation).     . predniSONE (DELTASONE) 5 MG tablet Take 5 mg by mouth daily with breakfast.    . rivaroxaban (XARELTO) 20 MG TABS tablet Take 1  tablet (20 mg total) by mouth daily. 30 tablet   . simvastatin (ZOCOR) 40 MG tablet Take 40 mg by mouth daily.    . tamsulosin (FLOMAX) 0.4 MG CAPS capsule Take 0.4 mg by mouth daily.     Marland Kitchen tiZANidine (ZANAFLEX) 4 MG capsule Take 4 mg by mouth at bedtime.    . benazepril (LOTENSIN) 10 MG tablet Take 10 mg by mouth daily.     No current facility-administered medications on file prior to visit.         Objective:   Physical Exam Blood pressure (!) 120/50, pulse 72, temperature 98.1 F (36.7 C), height 6\' 2"  (1.88 m), weight 185 lb 11.2 oz (84.2 kg). Alert and oriented. Skin warm and dry. Oral mucosa is moist.   . Sclera anicteric, conjunctivae is pink. Thyroid not enlarged. No cervical lymphadenopathy. Lungs clear. Heart regular rate and rhythm.  Abdomen is soft. Bowel sounds are positive. No hepatomegaly. No abdominal masses felt. No tenderness.  No edema to lower extremities.           Assessment & Plan:  Lower GI bleed. Will get a CBC today. Further recommendations to follow.

## 2016-12-07 ENCOUNTER — Other Ambulatory Visit (INDEPENDENT_AMBULATORY_CARE_PROVIDER_SITE_OTHER): Payer: Self-pay | Admitting: *Deleted

## 2016-12-07 ENCOUNTER — Telehealth (INDEPENDENT_AMBULATORY_CARE_PROVIDER_SITE_OTHER): Payer: Self-pay | Admitting: Internal Medicine

## 2016-12-07 ENCOUNTER — Encounter (INDEPENDENT_AMBULATORY_CARE_PROVIDER_SITE_OTHER): Payer: Self-pay | Admitting: *Deleted

## 2016-12-07 DIAGNOSIS — K922 Gastrointestinal hemorrhage, unspecified: Secondary | ICD-10-CM

## 2016-12-07 NOTE — Telephone Encounter (Signed)
Am going to get a CBC in 1 weeks. Looks like Hemoglobin has dropped.

## 2016-12-08 NOTE — Telephone Encounter (Signed)
Agree 

## 2016-12-14 LAB — CBC
HCT: 27.5 % — ABNORMAL LOW (ref 38.5–50.0)
HEMOGLOBIN: 8.7 g/dL — AB (ref 13.2–17.1)
MCH: 29.9 pg (ref 27.0–33.0)
MCHC: 31.6 g/dL — AB (ref 32.0–36.0)
MCV: 94.5 fL (ref 80.0–100.0)
MPV: 9.2 fL (ref 7.5–12.5)
PLATELETS: 307 10*3/uL (ref 140–400)
RBC: 2.91 MIL/uL — ABNORMAL LOW (ref 4.20–5.80)
RDW: 18.6 % — AB (ref 11.0–15.0)
WBC: 11.8 10*3/uL — ABNORMAL HIGH (ref 3.8–10.8)

## 2016-12-15 ENCOUNTER — Encounter (INDEPENDENT_AMBULATORY_CARE_PROVIDER_SITE_OTHER): Payer: Self-pay | Admitting: *Deleted

## 2016-12-15 ENCOUNTER — Other Ambulatory Visit (INDEPENDENT_AMBULATORY_CARE_PROVIDER_SITE_OTHER): Payer: Self-pay | Admitting: *Deleted

## 2016-12-15 DIAGNOSIS — K922 Gastrointestinal hemorrhage, unspecified: Secondary | ICD-10-CM

## 2016-12-15 DIAGNOSIS — K625 Hemorrhage of anus and rectum: Secondary | ICD-10-CM

## 2016-12-30 LAB — CBC
HEMATOCRIT: 27.6 % — AB (ref 38.5–50.0)
HEMOGLOBIN: 8.6 g/dL — AB (ref 13.2–17.1)
MCH: 28.9 pg (ref 27.0–33.0)
MCHC: 31.2 g/dL — ABNORMAL LOW (ref 32.0–36.0)
MCV: 92.6 fL (ref 80.0–100.0)
MPV: 8.5 fL (ref 7.5–12.5)
Platelets: 275 10*3/uL (ref 140–400)
RBC: 2.98 MIL/uL — ABNORMAL LOW (ref 4.20–5.80)
RDW: 18.8 % — AB (ref 11.0–15.0)
WBC: 8.1 10*3/uL (ref 3.8–10.8)

## 2017-01-09 ENCOUNTER — Encounter (INDEPENDENT_AMBULATORY_CARE_PROVIDER_SITE_OTHER): Payer: Self-pay | Admitting: *Deleted

## 2017-01-09 ENCOUNTER — Other Ambulatory Visit (INDEPENDENT_AMBULATORY_CARE_PROVIDER_SITE_OTHER): Payer: Self-pay | Admitting: *Deleted

## 2017-01-09 DIAGNOSIS — K922 Gastrointestinal hemorrhage, unspecified: Secondary | ICD-10-CM

## 2017-01-09 DIAGNOSIS — K625 Hemorrhage of anus and rectum: Secondary | ICD-10-CM

## 2017-01-23 LAB — HEMOGLOBIN AND HEMATOCRIT, BLOOD
HCT: 27 % — ABNORMAL LOW (ref 38.5–50.0)
Hemoglobin: 8.5 g/dL — ABNORMAL LOW (ref 13.2–17.1)

## 2017-01-24 ENCOUNTER — Telehealth (INDEPENDENT_AMBULATORY_CARE_PROVIDER_SITE_OTHER): Payer: Self-pay | Admitting: Internal Medicine

## 2017-01-24 DIAGNOSIS — D500 Iron deficiency anemia secondary to blood loss (chronic): Secondary | ICD-10-CM

## 2017-01-24 NOTE — Telephone Encounter (Signed)
Folate and Vitamin B12

## 2017-01-25 ENCOUNTER — Other Ambulatory Visit (INDEPENDENT_AMBULATORY_CARE_PROVIDER_SITE_OTHER): Payer: Self-pay | Admitting: *Deleted

## 2017-01-25 DIAGNOSIS — K625 Hemorrhage of anus and rectum: Secondary | ICD-10-CM

## 2017-01-25 DIAGNOSIS — K922 Gastrointestinal hemorrhage, unspecified: Secondary | ICD-10-CM

## 2017-01-26 ENCOUNTER — Encounter (INDEPENDENT_AMBULATORY_CARE_PROVIDER_SITE_OTHER): Payer: Self-pay | Admitting: *Deleted

## 2017-01-26 ENCOUNTER — Other Ambulatory Visit (INDEPENDENT_AMBULATORY_CARE_PROVIDER_SITE_OTHER): Payer: Self-pay | Admitting: *Deleted

## 2017-01-26 DIAGNOSIS — K922 Gastrointestinal hemorrhage, unspecified: Secondary | ICD-10-CM

## 2017-01-26 DIAGNOSIS — K625 Hemorrhage of anus and rectum: Secondary | ICD-10-CM

## 2017-01-27 LAB — CBC
HEMATOCRIT: 27.1 % — AB (ref 38.5–50.0)
HEMOGLOBIN: 8.6 g/dL — AB (ref 13.2–17.1)
MCH: 28.1 pg (ref 27.0–33.0)
MCHC: 31.7 g/dL — AB (ref 32.0–36.0)
MCV: 88.6 fL (ref 80.0–100.0)
MPV: 9.8 fL (ref 7.5–12.5)
Platelets: 315 10*3/uL (ref 140–400)
RBC: 3.06 10*6/uL — ABNORMAL LOW (ref 4.20–5.80)
RDW: 18.2 % — AB (ref 11.0–15.0)
WBC: 8 10*3/uL (ref 3.8–10.8)

## 2017-01-27 LAB — VITAMIN B12: Vitamin B-12: 717 pg/mL (ref 200–1100)

## 2017-01-27 LAB — FOLATE: Folate: >24 ng/mL

## 2017-01-30 ENCOUNTER — Encounter (INDEPENDENT_AMBULATORY_CARE_PROVIDER_SITE_OTHER): Payer: Self-pay | Admitting: *Deleted

## 2017-01-30 ENCOUNTER — Telehealth (INDEPENDENT_AMBULATORY_CARE_PROVIDER_SITE_OTHER): Payer: Self-pay | Admitting: *Deleted

## 2017-01-30 ENCOUNTER — Other Ambulatory Visit (INDEPENDENT_AMBULATORY_CARE_PROVIDER_SITE_OTHER): Payer: Self-pay | Admitting: *Deleted

## 2017-01-30 DIAGNOSIS — K922 Gastrointestinal hemorrhage, unspecified: Secondary | ICD-10-CM

## 2017-01-30 DIAGNOSIS — K625 Hemorrhage of anus and rectum: Secondary | ICD-10-CM

## 2017-01-30 NOTE — Telephone Encounter (Signed)
   Diagnosis:    Result(s)   Card 1: Negative:     Card 2: Negative:   Card 3: Negative:    Completed by: Shamica Moree,LPN   HEMOCCULT SENSA DEVELOPER: LOT#:  [id number] EXPIRATION DATE: 2020-10   HEMOCCULT SENSA CARD:  LOT#:  [id number] EXPIRATION DATE: 05/20   CARD CONTROL RESULTS:  POSITIVE: Positive NEGATIVE: Negative    ADDITIONAL COMMENTS: Forwarded to Lelon Perla

## 2017-01-31 ENCOUNTER — Other Ambulatory Visit (INDEPENDENT_AMBULATORY_CARE_PROVIDER_SITE_OTHER): Payer: Self-pay | Admitting: *Deleted

## 2017-01-31 DIAGNOSIS — K922 Gastrointestinal hemorrhage, unspecified: Secondary | ICD-10-CM

## 2017-01-31 DIAGNOSIS — K625 Hemorrhage of anus and rectum: Secondary | ICD-10-CM

## 2017-01-31 NOTE — Telephone Encounter (Signed)
Results given to patient. Will have blood draw next week

## 2017-09-05 ENCOUNTER — Encounter: Payer: Self-pay | Admitting: Internal Medicine

## 2017-09-06 LAB — TSH: TSH: 0.18 — AB (ref <=5.90)

## 2017-10-04 ENCOUNTER — Encounter: Payer: Self-pay | Admitting: Internal Medicine

## 2017-10-27 ENCOUNTER — Ambulatory Visit (INDEPENDENT_AMBULATORY_CARE_PROVIDER_SITE_OTHER): Payer: Medicare Other | Admitting: Internal Medicine

## 2017-10-27 ENCOUNTER — Encounter (INDEPENDENT_AMBULATORY_CARE_PROVIDER_SITE_OTHER): Payer: Self-pay | Admitting: Internal Medicine

## 2017-10-27 VITALS — BP 140/72 | HR 64 | Temp 97.7°F | Ht 74.0 in | Wt 177.7 lb

## 2017-10-27 DIAGNOSIS — R195 Other fecal abnormalities: Secondary | ICD-10-CM | POA: Diagnosis not present

## 2017-10-27 LAB — HEMOGLOBIN AND HEMATOCRIT, BLOOD
HCT: 32.7 % — ABNORMAL LOW (ref 38.5–50.0)
Hemoglobin: 11 g/dL — ABNORMAL LOW (ref 13.2–17.1)

## 2017-10-27 NOTE — Progress Notes (Signed)
Subjective:    Patient ID: Kevin Carlson., male    DOB: 1930/04/01, 84 y.o.   MRN: [id number]  HPI Referred by Dr. ,Shearon Stalls for positive stool card.  States he has not seen any blood. Dizziness but not related to his pressure.  No abdominal pain Stools are brown in color. Denies any melena.  Appetite is okay. Has lost about 8 pounds since his last visit.   Hx of post polypectomy bleed in the setting of anticoagulation.  Admitted to [facility] 11/27/2016 with c/o rectal bleeding. Admitted and received two units of blood.    His appetite is good. No weight loss.   09/06/2017 H and H 11.9 and 37.1 Hx of atrial fib and maintained on Eliquis    Underwent an EGD in July of 2018 for f/u of Barrett's esophagus. Impression: - Normal proximal esophagus and mid esophagus. - Esophageal mucosal changes secondary to  established short-segment Barrett's disease.  Biopsied. - GEJ at 38 cm and hiatus at 42 cm. - 4 cm hiatal hernia. - Normal stomach.  - Normal duodenal bulb and second portion of the  duodenum. Also underwent a colonoscopy for heme positive stools.  Impression: - Three 7 to 12 mm polyps (cluster) in the cecum,  removed piecemeal using a hot snare. Complete  resection. Partial retrieval. Clips (MR  conditional) were placed. - Diverticulosis in the sigmoid colon. - External hemorrhoids.  Esophageal biopsy shows Barrett's without dysplasia. Ascending colon polyp is tubular adenoma without dysplasia.  Review of Systems Past Medical History:  Diagnosis Date  . Anemia   . Arthritis   . BPH (benign prostatic hyperplasia)   .  Cancer ([place])    Skin Cancer-   . Carpal tunnel syndrome   . Complication of anesthesia 2011   " bleeding from Esophagus after extubated, had to have an endo  . Constipation due to opioid therapy   . DDD (degenerative disc disease), cervical   . Dysrhythmia   . GERD (gastroesophageal reflux disease)   . History of hiatal hernia   . Hyperlipemia   . Hypertension   . Nasal congestion   . Neuropathy   . Osteopenia   . Polymyalgia rheumatica (HCC)     Past Surgical History:  Procedure Laterality Date  . APPENDECTOMY  1951  . BIOPSY  11/18/2016   Procedure: BIOPSY;  Surgeon: Rogene Houston, MD;  [facility];  Service: Endoscopy;;  esophagus  . COLON RESECTION  1951   after appendectomy  . COLONOSCOPY N/A 11/18/2016   Procedure: COLONOSCOPY;  Surgeon: Rogene Houston, MD;  [facility];  Service: Endoscopy;  Laterality: N/A;  . COLONOSCOPY W/ POLYPECTOMY    . ESOPHAGOGASTRODUODENOSCOPY N/A 11/18/2016   Procedure: ESOPHAGOGASTRODUODENOSCOPY (EGD);  Surgeon: Rogene Houston, MD;  [facility];  Service: Endoscopy;  Laterality: N/A;  12:10  . EYE SURGERY Bilateral    cataract  . FINGER SURGERY Right    index- pinned  . HERNIA REPAIR Right   . HERNIA REPAIR Right 1948  . KNEE SURGERY Right 1974   "cleaned it out"  . LUMBAR LAMINECTOMY/DECOMPRESSION MICRODISCECTOMY N/A 03/20/2015   Procedure: Decompressive lumbar laminectomy L3-L5 ;  Surgeon: Erline Levine, MD;  Location: [place] [facility];  Service: Neurosurgery;  Laterality: N/A;  . POLYPECTOMY  11/18/2016   Procedure: POLYPECTOMY;  Surgeon: Rogene Houston, MD;  [facility];  Service: Endoscopy;;  colon   . REPLACEMENT TOTAL KNEE Left 2011  . REPLACEMENT  TOTAL KNEE Right 2006  . TONSILLECTOMY      No Known Allergies  Current Outpatient Medications on File Prior to Visit  Medication Sig Dispense Refill  . amLODipine-benazepril (LOTREL) 5-10 MG capsule Take 1 capsule by mouth daily.      Marland Kitchen apixaban (ELIQUIS) 5 MG TABS tablet Take 5 mg by mouth 2 (two) times daily.    Marland Kitchen gabapentin (NEURONTIN) 800 MG tablet Take 800 mg by mouth 2 (two) times daily.    Marland Kitchen HYDROcodone-acetaminophen (NORCO) 10-325 MG tablet Take 1 tablet by mouth daily as needed for moderate pain.     Marland Kitchen lansoprazole (PREVACID) 30 MG capsule Take 30 mg by mouth daily.     . metoprolol tartrate (LOPRESSOR) 25 MG tablet Take 25 mg by mouth daily.     . predniSONE (DELTASONE) 5 MG tablet Take 5 mg by mouth daily with breakfast.    . predniSONE (DELTASONE) 5 MG tablet Take 5 mg by mouth daily with breakfast.    . rivaroxaban (XARELTO) 20 MG TABS tablet Take 20 mg by mouth daily. Has not taken since discharge from hospital. 30 tablet   . simvastatin (ZOCOR) 40 MG tablet Take 40 mg by mouth daily.    . tamsulosin (FLOMAX) 0.4 MG CAPS capsule Take 0.4 mg by mouth daily.     Marland Kitchen tiZANidine (ZANAFLEX) 4 MG capsule Take 4 mg by mouth at bedtime.     No current facility-administered medications on file prior to visit.         Objective:   Physical Exam  Blood pressure 140/72, pulse 64, temperature 97.7 F (36.5 C), height 6\' 2"  (1.88 m), weight 177 lb 11.2 oz (80.6 kg). Alert and oriented. Skin warm and dry. Oral mucosa is moist.   . Sclera anicteric, conjunctivae is pink. Thyroid not enlarged. No cervical lymphadenopathy. Lungs clear. Heart regular rate and rhythm.  Abdomen is soft. Bowel sounds are positive. No hepatomegaly. No abdominal masses felt. No tenderness.  No edema to lower extremities.  . Rectal exam deferred.         Assessment & Plan:  Positive stool card. Will get an H and H today. 3 stool cards home with patient.

## 2017-10-30 ENCOUNTER — Telehealth (INDEPENDENT_AMBULATORY_CARE_PROVIDER_SITE_OTHER): Payer: Self-pay | Admitting: Internal Medicine

## 2017-10-30 ENCOUNTER — Other Ambulatory Visit (INDEPENDENT_AMBULATORY_CARE_PROVIDER_SITE_OTHER): Payer: Self-pay | Admitting: *Deleted

## 2017-10-30 ENCOUNTER — Telehealth (INDEPENDENT_AMBULATORY_CARE_PROVIDER_SITE_OTHER): Payer: Self-pay | Admitting: *Deleted

## 2017-10-30 ENCOUNTER — Encounter (INDEPENDENT_AMBULATORY_CARE_PROVIDER_SITE_OTHER): Payer: Self-pay | Admitting: *Deleted

## 2017-10-30 DIAGNOSIS — D649 Anemia, unspecified: Secondary | ICD-10-CM

## 2017-10-30 NOTE — Telephone Encounter (Signed)
Results have been given to patient 

## 2017-10-30 NOTE — Telephone Encounter (Signed)
Valor brought in 3 stool cards today and all were negative.

## 2017-10-30 NOTE — Telephone Encounter (Signed)
Results of 3 stool cards given to patient. All were negative. Will get an H and H in 4 weeks.

## 2017-10-30 NOTE — Telephone Encounter (Signed)
   Diagnosis:    Result(s)   Card 1: Negative:     Card 2: Negative:   Card 3: Negative:    Completed by: Thomas Hoff, LPN   HEMOCCULT SENSA DEVELOPER: XBD#:[id number]   EXPIRATION DATE: 2021-11   HEMOCCULT SENSA CARD:  EQA#:[id number]   EXPIRATION DATE: 05/21   CARD CONTROL RESULTS:  POSITIVE: Positive NEGATIVE: Negative    ADDITIONAL COMMENTS: Kevin Carlson had talked with the patient , giving him his results.

## 2017-11-27 ENCOUNTER — Ambulatory Visit: Payer: Medicare Other | Admitting: "Endocrinology

## 2017-11-27 ENCOUNTER — Encounter: Payer: Self-pay | Admitting: "Endocrinology

## 2017-11-27 VITALS — BP 127/71 | HR 70 | Ht 72.0 in | Wt 182.0 lb

## 2017-11-27 DIAGNOSIS — E0590 Thyrotoxicosis, unspecified without thyrotoxic crisis or storm: Secondary | ICD-10-CM | POA: Diagnosis not present

## 2017-11-27 LAB — T4, FREE: FREE T4: 1.1 ng/dL (ref 0.8–1.8)

## 2017-11-27 LAB — T3, FREE: T3 FREE: 2.5 pg/mL (ref 2.3–4.2)

## 2017-11-27 LAB — TSH: TSH: 0.18 m[IU]/L — AB (ref 0.40–4.50)

## 2017-11-27 NOTE — Progress Notes (Signed)
Endocrinology Consult Note                                            11/27/2017, 6:54 PM   Subjective:    Patient ID: Kevin Holms., male    DOB: 01/30/1930, PCP Kennieth Rad, MD   Past Medical History:  Diagnosis Date  . Anemia   . Arthritis   . BPH (benign prostatic hyperplasia)   . Cancer ([place])    Skin Cancer-   . Carpal tunnel syndrome   . Complication of anesthesia 2011   " bleeding from Esophagus after extubated, had to have an endo  . Constipation due to opioid therapy   . DDD (degenerative disc disease), cervical   . Dysrhythmia   . GERD (gastroesophageal reflux disease)   . History of hiatal hernia   . Hyperlipemia   . Hypertension   . Nasal congestion   . Neuropathy   . Osteopenia   . Polymyalgia rheumatica (HCC)    Past Surgical History:  Procedure Laterality Date  . APPENDECTOMY  1951  . BACK SURGERY  2017  . BIOPSY  11/18/2016   Procedure: BIOPSY;  Surgeon: Rogene Houston, MD;  [facility];  Service: Endoscopy;;  esophagus  . COLON RESECTION  1951   after appendectomy  . COLONOSCOPY N/A 11/18/2016   Procedure: COLONOSCOPY;  Surgeon: Rogene Houston, MD;  [facility];  Service: Endoscopy;  Laterality: N/A;  . COLONOSCOPY W/ POLYPECTOMY    . ESOPHAGOGASTRODUODENOSCOPY N/A 11/18/2016   Procedure: ESOPHAGOGASTRODUODENOSCOPY (EGD);  Surgeon: Rogene Houston, MD;  [facility];  Service: Endoscopy;  Laterality: N/A;  12:10  . EYE SURGERY Bilateral    cataract  . FINGER SURGERY Right    index- pinned  . HERNIA REPAIR Right   . HERNIA REPAIR Right 1948  . KNEE SURGERY Right 1974   "cleaned it out"  . LUMBAR LAMINECTOMY/DECOMPRESSION MICRODISCECTOMY N/A 03/20/2015   Procedure: Decompressive lumbar laminectomy L3-L5 ;  Surgeon: Erline Levine, MD;  Location: [place] [facility];  Service: Neurosurgery;  Laterality: N/A;  . POLYPECTOMY  11/18/2016   Procedure: POLYPECTOMY;  Surgeon: Rogene Houston, MD;  [facility];  Service: Endoscopy;;  colon   . REPLACEMENT TOTAL KNEE Left 2011  . REPLACEMENT TOTAL KNEE Right 2006  . TONSILLECTOMY     Social History   Socioeconomic History  . Marital status: Widowed    Spouse name: Not on file  . Number of children: Not on file  . Years of education: Not on file  . Highest education level: Not on file  Occupational History  . Not on file  Social Needs  . Financial resource strain: Not on file  . Food insecurity:    Worry: Not on file    Inability: Not on file  . Transportation needs:    Medical: Not on file    Non-medical: Not on file  Tobacco Use  . Smoking status: Former Smoker    Years: 25.00    Last attempt to quit: 05/02/1974    Years since quitting: 43.6  . Smokeless tobacco: Never Used  . Tobacco comment: quit smoking in 1976  Substance and Sexual Activity  . Alcohol use: No  . Drug use: No  . Sexual activity: Not on file  Lifestyle  .  Physical activity:    Days per week: Not on file    Minutes per session: Not on file  . Stress: Not on file  Relationships  . Social connections:    Talks on phone: Not on file    Gets together: Not on file    Attends religious service: Not on file    Active member of club or organization: Not on file    Attends meetings of clubs or organizations: Not on file    Relationship status: Not on file  Other Topics Concern  . Not on file  Social History Narrative  . Not on file   Outpatient Encounter Medications as of 11/27/2017  Medication Sig  . amLODipine-benazepril (LOTREL) 5-10 MG capsule Take 1 capsule by mouth daily.  Marland Kitchen apixaban (ELIQUIS) 5 MG TABS tablet Take 5 mg by mouth 2 (two) times daily.  Marland Kitchen gabapentin (NEURONTIN) 800 MG tablet Take 800 mg by mouth 2 (two) times daily.  Marland Kitchen HYDROcodone-acetaminophen (NORCO) 10-325 MG tablet Take 1 tablet by mouth daily as needed for moderate pain.   Marland Kitchen lansoprazole (PREVACID) 30 MG capsule Take 30 mg by mouth daily.   Marland Kitchen leflunomide (ARAVA) 20 MG  tablet Take 20 mg by mouth daily.  . metoprolol tartrate (LOPRESSOR) 25 MG tablet Take 25 mg by mouth daily.   . predniSONE (DELTASONE) 5 MG tablet Take 5 mg by mouth daily with breakfast.  . simvastatin (ZOCOR) 40 MG tablet Take 40 mg by mouth daily.  . tamsulosin (FLOMAX) 0.4 MG CAPS capsule Take 0.4 mg by mouth daily.   Marland Kitchen tiZANidine (ZANAFLEX) 4 MG capsule Take 4 mg by mouth at bedtime.  . [DISCONTINUED] predniSONE (DELTASONE) 5 MG tablet Take 5 mg by mouth daily with breakfast.  . [DISCONTINUED] rivaroxaban (XARELTO) 20 MG TABS tablet Take 20 mg by mouth daily. Has not taken since discharge from hospital.  . [DISCONTINUED] tiZANidine (ZANAFLEX) 4 MG tablet Take 4 mg by mouth See admin instructions.   No facility-administered encounter medications on file as of 11/27/2017.    ALLERGIES: No Known Allergies  VACCINATION STATUS:  There is no immunization history on file for this patient.  HPI Kevin Carlson is 84 y.o. male who presents today with a medical history as above. he is being seen in consultation for subclinical hyperthyroidism requested by Kennieth Rad, MD.  He denies any prior history of thyroid dysfunction.  On Sep 06, 2017 he was found to have suppressed TSH of 0.18 and total T4 of 4.7.  -He reports her recent weight loss of 10 pounds over a period of several months.  He denies palpitations, patient is on metoprolol 25 mg p.o. daily.  No heat intolerance, tremors, nor bowel habit changes. He is on multiple medications related to his history of hypertension, hypercholesterolemia,.  -He denies dysphagia, shortness of breath, change in voice.  He denies any family history of thyroid dysfunction nor thyroid cancer.  Review of Systems  Constitutional: + weight loss, no fatigue, no subjective hyperthermia, no subjective hypothermia Eyes: no blurry vision, no xerophthalmia ENT: no sore throat, no nodules palpated in throat, no dysphagia/odynophagia, no  hoarseness Cardiovascular: no Chest Pain, no Shortness of Breath, no palpitations, no leg swelling Respiratory: no cough, no SOB Gastrointestinal: no Nausea/Vomiting/Diarhhea Musculoskeletal: no muscle/joint aches Skin: no rashes Neurological: no tremors, no numbness, no tingling, no dizziness Psychiatric: no depression, no anxiety  Objective:    BP 127/71 (BP Location: Right Arm, Patient Position: Sitting)   Pulse 70  Ht 6' (1.829 m)   Wt 182 lb (82.6 kg)   SpO2 96%   BMI 24.68 kg/m   Wt Readings from Last 3 Encounters:  11/27/17 182 lb (82.6 kg)  10/27/17 177 lb 11.2 oz (80.6 kg)  12/06/16 185 lb 11.2 oz (84.2 kg)    Physical Exam  Constitutional:  + Appropriate weight for height, not in acute distress, normal state of mind Eyes: PERRLA, EOMI, no exophthalmos ENT: moist mucous membranes, no thyromegaly, no cervical lymphadenopathy Cardiovascular: normal precordial activity, Regular Rate and Rhythm, no Murmur/Rubs/Gallops Respiratory:  adequate breathing efforts, no gross chest deformity, Clear to auscultation bilaterally Gastrointestinal: abdomen soft, Non -tender, No distension, Bowel Sounds present Musculoskeletal: no gross deformities, strength intact in all four extremities Skin: moist, warm, no rashes Neurological: no tremor with outstretched hands, Deep tendon reflexes normal in all four extremities.  CMP ( most recent) CMP     Component Value Date/Time   NA 140 11/28/2016 0737   K 4.5 11/28/2016 0737   CL 109 11/28/2016 0737   CO2 22 11/28/2016 0737   GLUCOSE 98 11/28/2016 0737   BUN 22 (H) 11/28/2016 0737   CREATININE 1.07 11/28/2016 0737   CALCIUM 8.5 (L) 11/28/2016 0737   PROT 5.1 (L) 11/28/2016 0737   ALBUMIN 3.3 (L) 11/28/2016 0737   AST 16 11/28/2016 0737   ALT 10 (L) 11/28/2016 0737   ALKPHOS 35 (L) 11/28/2016 0737   BILITOT 0.8 11/28/2016 0737   GFRNONAA >60 11/28/2016 0737   GFRAA >60 11/28/2016 0737   Sep 06, 2017 labs: TSH 0.18, total T4  4.7  Assessment & Plan:   1. Subclinical hyperthyroidism - Kevin Carlson.  is being seen at a kind request of Kennieth Rad, MD. - I have reviewed his available thyroid records and clinically evaluated the patient. - Based on reviews, he has subclinical hyperthyroidism,  however,  there is not sufficient information to proceed with definitive treatment plan.  - he will need a repeat,  more complete thyroid function tests including TSH, free T4/free T3 towards confirming the diagnosis.  -he will return in 10 days to review his repeat labs.   If his  labs are suggestive of hyperthyroidism, he will be considered for uptake and scan to confirm diagnosis.  - I did not initiate any new prescriptions today. - I advised him  to maintain close follow up with Kennieth Rad, MD for primary care needs.   Kevin Holms. participated in the discussions, expressed understanding, and voiced agreement with the above plans.  All questions were answered to his satisfaction. he is encouraged to contact clinic should he have any questions or concerns prior to his return visit.  Follow up plan: Return in about 10 days (around 12/07/2017) for follow up with pre-visit labs.   Glade Lloyd, MD [facility] [address] [place], [place] [address] Phone: [phone]  Fax: [phone]     11/27/2017, 6:54 PM  This note was partially dictated with voice recognition software. Similar sounding words can be transcribed inadequately or may not  be corrected upon review.

## 2017-12-06 ENCOUNTER — Ambulatory Visit (INDEPENDENT_AMBULATORY_CARE_PROVIDER_SITE_OTHER): Payer: Medicare Other | Admitting: Internal Medicine

## 2017-12-07 ENCOUNTER — Ambulatory Visit (INDEPENDENT_AMBULATORY_CARE_PROVIDER_SITE_OTHER): Payer: Medicare Other | Admitting: Internal Medicine

## 2017-12-07 ENCOUNTER — Encounter (INDEPENDENT_AMBULATORY_CARE_PROVIDER_SITE_OTHER): Payer: Self-pay | Admitting: Internal Medicine

## 2017-12-07 VITALS — BP 112/62 | HR 64 | Temp 98.1°F | Ht 71.0 in | Wt 179.5 lb

## 2017-12-07 DIAGNOSIS — R195 Other fecal abnormalities: Secondary | ICD-10-CM | POA: Diagnosis not present

## 2017-12-07 LAB — HEMOGLOBIN AND HEMATOCRIT, BLOOD
HCT: 31.9 % — ABNORMAL LOW (ref 38.5–50.0)
Hemoglobin: 10.8 g/dL — ABNORMAL LOW (ref 13.2–17.1)

## 2017-12-07 NOTE — Patient Instructions (Signed)
H and H today. OV in 1 year.  

## 2017-12-07 NOTE — Progress Notes (Signed)
Subjective:    Patient ID: Kevin Holms., male    DOB: [birth year], 84 y.o.   MRN: [id number]  HPI Here today for f/u. Last seen in June. Referred by Dr. Shearon Stalls for positive stool card. Last colonoscopy was in 2018 by Dr. Laural Golden for heme positive stools.   Three stool cards sent home with patient were negative for blood.  Doing well. Appetite is good. No weight loss. Has not seen any rectal bleeding. Hx of post polypectomy bleed in the setting of anticoagulation. [facility] with c/o rectal bleeding. Admitted and received twounits of blood.  BMs are normal. No melena or BRRB.     10/27/2017 H and H 11.0 and 32.7.  .   Hx of atrial fib and maintained on Eliquis    Underwent an EGD in July of 2018 for f/u of Barrett's esophagus.Impression: - Normal proximal esophagus and mid esophagus. - Esophageal mucosal changes secondary to  established short-segment Barrett's disease.  Biopsied. - GEJ at 38 cm and hiatus at 42 cm. - 4 cm hiatal hernia. - Normal stomach.  - Normal duodenal bulb and second portion of the  duodenum. Also underwent a colonoscopy for heme positive stools. Impression: - Three 7 to 12 mm polyps (cluster) in the cecum,  removed piecemeal using a hot snare. Complete  resection. Partial retrieval. Clips (MR  conditional) were placed. - Diverticulosis in the sigmoid colon. - External hemorrhoids.  Esophageal biopsy shows Barrett's without dysplasia. Ascending colon polyp is tubular adenoma without dysplasia.     Review of Systems Past Medical History:  Diagnosis Date  . Anemia   .  Arthritis   . BPH (benign prostatic hyperplasia)   . Cancer ([place])    Skin Cancer-   . Carpal tunnel syndrome   . Complication of anesthesia 2011   " bleeding from Esophagus after extubated, had to have an endo  . Constipation due to opioid therapy   . DDD (degenerative disc disease), cervical   . Dysrhythmia   . GERD (gastroesophageal reflux disease)   . History of hiatal hernia   . Hyperlipemia   . Hypertension   . Nasal congestion   . Neuropathy   . Osteopenia   . Polymyalgia rheumatica (HCC)     Past Surgical History:  Procedure Laterality Date  . APPENDECTOMY  1951  . BACK SURGERY  2017  . BIOPSY  11/18/2016   Procedure: BIOPSY;  Surgeon: Rogene Houston, MD;  [facility];  Service: Endoscopy;;  esophagus  . COLON RESECTION  1951   after appendectomy  . COLONOSCOPY N/A 11/18/2016   Procedure: COLONOSCOPY;  Surgeon: Rogene Houston, MD;  [facility];  Service: Endoscopy;  Laterality: N/A;  . COLONOSCOPY W/ POLYPECTOMY    . ESOPHAGOGASTRODUODENOSCOPY N/A 11/18/2016   Procedure: ESOPHAGOGASTRODUODENOSCOPY (EGD);  Surgeon: Rogene Houston, MD;  [facility];  Service: Endoscopy;  Laterality: N/A;  12:10  . EYE SURGERY Bilateral    cataract  . FINGER SURGERY Right    index- pinned  . HERNIA REPAIR Right   . HERNIA REPAIR Right 1948  . KNEE SURGERY Right 1974   "cleaned it out"  . LUMBAR LAMINECTOMY/DECOMPRESSION MICRODISCECTOMY N/A 03/20/2015   Procedure: Decompressive lumbar laminectomy L3-L5 ;  Surgeon: Erline Levine, MD;  Location: [place] [facility];  Service: Neurosurgery;  Laterality: N/A;  . POLYPECTOMY  11/18/2016   Procedure: POLYPECTOMY;  Surgeon: Rogene Houston, MD;  [facility];  Service: Endoscopy;;  colon   . REPLACEMENT TOTAL KNEE Left 2011  . REPLACEMENT TOTAL KNEE Right 2006  . TONSILLECTOMY      No Known Allergies  Current Outpatient Medications on File Prior to Visit  Medication Sig Dispense Refill  .  amLODipine-benazepril (LOTREL) 5-10 MG capsule Take 1 capsule by mouth daily.    Marland Kitchen apixaban (ELIQUIS) 5 MG TABS tablet Take 5 mg by mouth 2 (two) times daily.    Marland Kitchen gabapentin (NEURONTIN) 800 MG tablet Take 800 mg by mouth 2 (two) times daily.    Marland Kitchen HYDROcodone-acetaminophen (NORCO) 10-325 MG tablet Take 1 tablet by mouth daily as needed for moderate pain.     Marland Kitchen lansoprazole (PREVACID) 30 MG capsule Take 30 mg by mouth daily.     Marland Kitchen leflunomide (ARAVA) 20 MG tablet Take 20 mg by mouth daily.  0  . metoprolol tartrate (LOPRESSOR) 25 MG tablet Take 25 mg by mouth daily.     . predniSONE (DELTASONE) 5 MG tablet Take 5 mg by mouth daily with breakfast.    . simvastatin (ZOCOR) 40 MG tablet Take 40 mg by mouth daily.    . tamsulosin (FLOMAX) 0.4 MG CAPS capsule Take 0.4 mg by mouth daily.     Marland Kitchen tiZANidine (ZANAFLEX) 4 MG capsule Take 4 mg by mouth at bedtime.     No current facility-administered medications on file prior to visit.         Objective:   Physical Exam. Blood pressure 112/62, pulse 64, temperature 98.1 F (36.7 C), height 5\' 11"  (1.803 m), weight 179 lb 8 oz (81.4 kg). Alert and oriented. Skin warm and dry. Oral mucosa is moist.   . Sclera anicteric, conjunctivae is pink. Thyroid not enlarged. No cervical lymphadenopathy. Lungs clear. Heart regular rate and rhythm.  Abdomen is soft. Bowel sounds are positive. No hepatomegaly. No abdominal masses felt. No tenderness.  No edema to lower extremities.          Assessment & Plan:  Heme positive stool. Stool cards x 3 negative at our office. Last colonoscopy in 2018 for heme positive stool.   H and H has remained stable.  Will recheck H and H today.  OV in 1 year.

## 2017-12-08 ENCOUNTER — Other Ambulatory Visit (INDEPENDENT_AMBULATORY_CARE_PROVIDER_SITE_OTHER): Payer: Self-pay | Admitting: *Deleted

## 2017-12-08 DIAGNOSIS — K921 Melena: Secondary | ICD-10-CM

## 2017-12-18 ENCOUNTER — Encounter: Payer: Self-pay | Admitting: "Endocrinology

## 2017-12-18 ENCOUNTER — Ambulatory Visit (INDEPENDENT_AMBULATORY_CARE_PROVIDER_SITE_OTHER): Payer: Medicare Other | Admitting: "Endocrinology

## 2017-12-18 VITALS — BP 129/71 | HR 63 | Ht 71.0 in | Wt 181.0 lb

## 2017-12-18 DIAGNOSIS — E0590 Thyrotoxicosis, unspecified without thyrotoxic crisis or storm: Secondary | ICD-10-CM

## 2017-12-18 NOTE — Progress Notes (Signed)
Endocrinology follow up Note                                            12/18/2017, 2:01 PM   Subjective:    Patient ID: Kevin Holms., male    DOB: 11/02/1929, PCP Kennieth Rad, MD   Past Medical History:  Diagnosis Date  . Anemia   . Arthritis   . BPH (benign prostatic hyperplasia)   . Cancer ([place])    Skin Cancer-   . Carpal tunnel syndrome   . Complication of anesthesia 2011   " bleeding from Esophagus after extubated, had to have an endo  . Constipation due to opioid therapy   . DDD (degenerative disc disease), cervical   . Dysrhythmia   . GERD (gastroesophageal reflux disease)   . History of hiatal hernia   . Hyperlipemia   . Hypertension   . Nasal congestion   . Neuropathy   . Osteopenia   . Polymyalgia rheumatica ([facility])    Past Surgical History:  Procedure Laterality Date  . APPENDECTOMY  1951  . BACK SURGERY  2017  . BIOPSY  11/18/2016   Procedure: BIOPSY;  Surgeon: Rogene Houston, MD;  [facility];  Service: Endoscopy;;  esophagus  . COLON RESECTION  1951   after appendectomy  . COLONOSCOPY N/A 11/18/2016   Procedure: COLONOSCOPY;  Surgeon: Rogene Houston, MD;  [facility];  Service: Endoscopy;  Laterality: N/A;  . COLONOSCOPY W/ POLYPECTOMY    . ESOPHAGOGASTRODUODENOSCOPY N/A 11/18/2016   Procedure: ESOPHAGOGASTRODUODENOSCOPY (EGD);  Surgeon: Rogene Houston, MD;  [facility];  Service: Endoscopy;  Laterality: N/A;  12:10  . EYE SURGERY Bilateral    cataract  . FINGER SURGERY Right    index- pinned  . HERNIA REPAIR Right   . HERNIA REPAIR Right 1948  . KNEE SURGERY Right 1974   "cleaned it out"  . LUMBAR LAMINECTOMY/DECOMPRESSION MICRODISCECTOMY N/A 03/20/2015   Procedure: Decompressive lumbar laminectomy L3-L5 ;  Surgeon: Erline Levine, MD;  Location: [facility];  Service: Neurosurgery;  Laterality: N/A;  . POLYPECTOMY  11/18/2016   Procedure: POLYPECTOMY;  Surgeon: Rogene Houston, MD;  [facility]  [facility];  Service: Endoscopy;;  colon   . REPLACEMENT TOTAL KNEE Left 2011  . REPLACEMENT TOTAL KNEE Right 2006  . TONSILLECTOMY     Social History   Socioeconomic History  . Marital status: Widowed    Spouse name: Not on file  . Number of children: Not on file  . Years of education: Not on file  . Highest education level: Not on file  Occupational History  . Not on file  Social Needs  . Financial resource strain: Not on file  . Food insecurity:    Worry: Not on file    Inability: Not on file  . Transportation needs:    Medical: Not on file    Non-medical: Not on file  Tobacco Use  . Smoking status: Former Smoker    Years: 25.00    Last attempt to quit: 05/02/1974    Years since quitting: 43.6  . Smokeless tobacco: Never Used  . Tobacco comment: quit smoking in 1976  Substance and Sexual Activity  . Alcohol use: No  . Drug use: No  . Sexual activity: Not on file  Lifestyle  .  Physical activity:    Days per week: Not on file    Minutes per session: Not on file  . Stress: Not on file  Relationships  . Social connections:    Talks on phone: Not on file    Gets together: Not on file    Attends religious service: Not on file    Active member of club or organization: Not on file    Attends meetings of clubs or organizations: Not on file    Relationship status: Not on file  Other Topics Concern  . Not on file  Social History Narrative  . Not on file   Outpatient Encounter Medications as of 12/18/2017  Medication Sig  . amLODipine-benazepril (LOTREL) 5-10 MG capsule Take 1 capsule by mouth daily.  Marland Kitchen apixaban (ELIQUIS) 5 MG TABS tablet Take 5 mg by mouth 2 (two) times daily.  Marland Kitchen gabapentin (NEURONTIN) 800 MG tablet Take 800 mg by mouth 2 (two) times daily.  Marland Kitchen HYDROcodone-acetaminophen (NORCO) 10-325 MG tablet Take 1 tablet by mouth daily as needed for moderate pain.   . Iron-FA-B Cmp-C-Biot-Probiotic (FUSION PLUS PO) Take by mouth.  . lansoprazole (PREVACID) 30 MG  capsule Take 30 mg by mouth daily.   Marland Kitchen leflunomide (ARAVA) 20 MG tablet Take 20 mg by mouth daily.  . metoprolol tartrate (LOPRESSOR) 25 MG tablet Take 25 mg by mouth daily.   . predniSONE (DELTASONE) 5 MG tablet Take 5 mg by mouth daily with breakfast.  . simvastatin (ZOCOR) 40 MG tablet Take 40 mg by mouth daily.  . tamsulosin (FLOMAX) 0.4 MG CAPS capsule Take 0.4 mg by mouth daily.   Marland Kitchen tiZANidine (ZANAFLEX) 4 MG capsule Take 4 mg by mouth at bedtime.   No facility-administered encounter medications on file as of 12/18/2017.    ALLERGIES: No Known Allergies  VACCINATION STATUS:  There is no immunization history on file for this patient.  HPI Kevin Carlson is 84 y.o. male who presents today with a medical history as above. he is returning with repeat thyroid function tests after he was seen in consultation for subclinical hyperthyroidism.     He denies any prior history of thyroid dysfunction.  He denies any new complaints today.  He is on metoprolol 25 mg p.o. daily.  He denies any palpitations, tremors, heat intolerance.  He has a stable weight since last visit.   He is on multiple medications related to his history of hypertension, hypercholesterolemia,.  -He denies dysphagia, shortness of breath, change in voice.  He denies any family history of thyroid dysfunction nor thyroid cancer.  Review of Systems  Constitutional: + stable Weight, no fatigue, no subjective hyperthermia, no subjective hypothermia Eyes: no blurry vision, no xerophthalmia ENT: no sore throat, no nodules palpated in throat, no dysphagia/odynophagia, no hoarseness Musculoskeletal: no muscle/joint aches Skin: no rashes Neurological: no tremors, no numbness, no tingling, no dizziness Psychiatric: no depression, no anxiety  Objective:    BP 129/71   Pulse 63   Ht 5\' 11"  (1.803 m)   Wt 181 lb (82.1 kg)   BMI 25.24 kg/m   Wt Readings from Last 3 Encounters:  12/18/17 181 lb (82.1 kg)  12/07/17 179  lb 8 oz (81.4 kg)  11/27/17 182 lb (82.6 kg)    Physical Exam  Constitutional:  + Appropriate weight for height, not in acute distress, normal state of mind Eyes: PERRLA, EOMI, no exophthalmos ENT: moist mucous membranes, no thyromegaly, no cervical lymphadenopathy  Musculoskeletal: no gross deformities, strength  intact in all four extremities Skin: moist, warm, no rashes Neurological: no tremor with outstretched hands, Deep tendon reflexes normal in all four extremities.  CMP ( most recent) CMP     Component Value Date/Time   NA 140 11/28/2016 0737   K 4.5 11/28/2016 0737   CL 109 11/28/2016 0737   CO2 22 11/28/2016 0737   GLUCOSE 98 11/28/2016 0737   BUN 22 (H) 11/28/2016 0737   CREATININE 1.07 11/28/2016 0737   CALCIUM 8.5 (L) 11/28/2016 0737   PROT 5.1 (L) 11/28/2016 0737   ALBUMIN 3.3 (L) 11/28/2016 0737   AST 16 11/28/2016 0737   ALT 10 (L) 11/28/2016 0737   ALKPHOS 35 (L) 11/28/2016 0737   BILITOT 0.8 11/28/2016 0737   GFRNONAA >60 11/28/2016 0737   GFRAA >60 11/28/2016 0737   Sep 06, 2017 labs: TSH 0.18, total T4 4.7  Assessment & Plan:   1. Subclinical hyperthyroidism -His repeat labs are still suggestive of mild subclinical hyperthyroidism.  Patient has no specific symptoms or signs of hyperthyroidism.  He will not be provided with any definitive antithyroid treatment.  I will proceed with observation care with plan to repeat thyroid function test in 3 months with office visit.  - I did not initiate any new prescriptions today. - I advised him  to maintain close follow up with Kennieth Rad, MD for primary care needs.   Kevin Holms. participated in the discussions, expressed understanding, and voiced agreement with the above plans.  All questions were answered to his satisfaction. he is encouraged to contact clinic should he have any questions or concerns prior to his return visit.  Follow up plan: Return in about 6 months (around 06/20/2018) for Follow  up with Pre-visit Labs.   Glade Lloyd, MD [facility] [address] [place], [place] [address] Phone: [phone]  Fax: [phone]     12/18/2017, 2:01 PM  This note was partially dictated with voice recognition software. Similar sounding words can be transcribed inadequately or may not  be corrected upon review.

## 2017-12-22 ENCOUNTER — Other Ambulatory Visit (INDEPENDENT_AMBULATORY_CARE_PROVIDER_SITE_OTHER): Payer: Self-pay | Admitting: *Deleted

## 2017-12-22 ENCOUNTER — Encounter (INDEPENDENT_AMBULATORY_CARE_PROVIDER_SITE_OTHER): Payer: Self-pay | Admitting: *Deleted

## 2017-12-22 DIAGNOSIS — K921 Melena: Secondary | ICD-10-CM

## 2018-01-06 ENCOUNTER — Encounter (HOSPITAL_COMMUNITY): Payer: Self-pay

## 2018-01-06 ENCOUNTER — Emergency Department (HOSPITAL_COMMUNITY): Payer: Medicare Other

## 2018-01-06 ENCOUNTER — Emergency Department (HOSPITAL_COMMUNITY)
Admission: EM | Admit: 2018-01-06 | Discharge: 2018-01-06 | Disposition: A | Discharge status: Home / Self Care | Payer: Medicare Other | Attending: Emergency Medicine | Admitting: Emergency Medicine

## 2018-01-06 ENCOUNTER — Other Ambulatory Visit: Payer: Self-pay

## 2018-01-06 DIAGNOSIS — N289 Disorder of kidney and ureter, unspecified: Secondary | ICD-10-CM

## 2018-01-06 DIAGNOSIS — D72829 Elevated white blood cell count, unspecified: Secondary | ICD-10-CM | POA: Diagnosis not present

## 2018-01-06 DIAGNOSIS — Z79899 Other long term (current) drug therapy: Secondary | ICD-10-CM | POA: Diagnosis not present

## 2018-01-06 DIAGNOSIS — M545 Low back pain: Secondary | ICD-10-CM | POA: Insufficient documentation

## 2018-01-06 DIAGNOSIS — I10 Essential (primary) hypertension: Secondary | ICD-10-CM | POA: Diagnosis not present

## 2018-01-06 DIAGNOSIS — D539 Nutritional anemia, unspecified: Secondary | ICD-10-CM | POA: Diagnosis not present

## 2018-01-06 DIAGNOSIS — N2889 Other specified disorders of kidney and ureter: Secondary | ICD-10-CM | POA: Diagnosis not present

## 2018-01-06 DIAGNOSIS — Z87891 Personal history of nicotine dependence: Secondary | ICD-10-CM | POA: Diagnosis not present

## 2018-01-06 DIAGNOSIS — M79606 Pain in leg, unspecified: Secondary | ICD-10-CM

## 2018-01-06 LAB — CBC WITH DIFFERENTIAL/PLATELET
ABS IMMATURE GRANULOCYTES: 0.1 10*3/uL (ref 0.0–0.1)
BASOS ABS: 0.1 10*3/uL (ref 0.0–0.1)
Basophils Relative: 1 %
Eosinophils Absolute: 0.1 10*3/uL (ref 0.0–0.7)
Eosinophils Relative: 1 %
HEMATOCRIT: 34.2 % — AB (ref 39.0–52.0)
HEMOGLOBIN: 11 g/dL — AB (ref 13.0–17.0)
Immature Granulocytes: 1 %
LYMPHS ABS: 1.2 10*3/uL (ref 0.7–4.0)
LYMPHS PCT: 9 %
MCH: 34.7 pg — ABNORMAL HIGH (ref 26.0–34.0)
MCHC: 32.2 g/dL (ref 30.0–36.0)
MCV: 107.9 fL — ABNORMAL HIGH (ref 78.0–100.0)
MONO ABS: 1 10*3/uL (ref 0.1–1.0)
MONOS PCT: 8 %
NEUTROS ABS: 10 10*3/uL — AB (ref 1.7–7.7)
Neutrophils Relative %: 80 %
Platelets: 197 10*3/uL (ref 150–400)
RBC: 3.17 MIL/uL — ABNORMAL LOW (ref 4.22–5.81)
RDW: 17.5 % — ABNORMAL HIGH (ref 11.5–15.5)
WBC: 12.5 10*3/uL — ABNORMAL HIGH (ref 4.0–10.5)

## 2018-01-06 LAB — BASIC METABOLIC PANEL
ANION GAP: 9 (ref 5–15)
BUN: 17 mg/dL (ref 8–23)
CALCIUM: 8.9 mg/dL (ref 8.9–10.3)
CO2: 24 mmol/L (ref 22–32)
Chloride: 107 mmol/L (ref 98–111)
Creatinine, Ser: 1.11 mg/dL (ref 0.61–1.24)
GFR calc non Af Amer: 59 mL/min — ABNORMAL LOW (ref >60)
Glucose, Bld: 107 mg/dL — ABNORMAL HIGH (ref 70–99)
Potassium: 4.4 mmol/L (ref 3.5–5.1)
SODIUM: 140 mmol/L (ref 135–145)

## 2018-01-06 MED ORDER — MORPHINE SULFATE (PF) 4 MG/ML IV SOLN
4.0000 mg | Freq: Once | INTRAVENOUS | Status: AC
  Administered 2018-01-06: 4 mg via INTRAVENOUS
  Filled 2018-01-06: qty 1

## 2018-01-06 MED ORDER — CAMPHOR-MENTHOL-METHYL SAL 1.2-5.7-6.3 % EX PTCH: 1.0000 | MEDICATED_PATCH | Freq: Every day | CUTANEOUS | 0 refills | Status: DC

## 2018-01-06 MED ORDER — LIDOCAINE 5 % EX PTCH
1.0000 | MEDICATED_PATCH | CUTANEOUS | Status: DC
  Filled 2018-01-06: qty 1

## 2018-01-06 NOTE — ED Provider Notes (Signed)
[place] EMERGENCY DEPARTMENT Provider Note   CSN: [id number] Arrival date & time: 01/06/18  1159     History   Chief Complaint Chief Complaint  Patient presents with  . Back Pain    HPI Kevin Schnebly. is a 84 y.o. male.  HPI  Patient is an 84 year old male with a history of DJD, status post laminectomy and microdiscectomy in 2017 followed by Dr. Vertell Limber, hyperlipidemia, hypertension, atrial fibrillation on Eliquis, and former smoker presenting for low back pain and right leg pain.  Patient reports that his pain is been progressively worsening over the past week.  Patient did have a fall off a lawnmower onto his left side approximately 3 weeks ago, and noticed that he began having pain approximately 3 days after the fall.  Patient reports that his pain is constant, keeps him up at night, and he will have aching in the back and the like that is not improved by rest or movement.  Patient reports that the only position that marginally helps his pain is by bending down.  Patient reports taking Norco, prescribed by his "arthritis doctor", however it is not helping his pain.  No abdominal pain, nausea, or vomiting. Patient denies any lower extremity weakness, numbness, saddle anesthesia, loss of bowel or bladder control, fever or chills.  Patient does have history of skin cancer.  Patient denies any change in color or warm or edematous joints of hip or knee on the right side.  Patient denies any history of imaging of his aorta.  Patient quit smoking in 1976.  Past Medical History:  Diagnosis Date  . Anemia   . Arthritis   . BPH (benign prostatic hyperplasia)   . Cancer ([place])    Skin Cancer-   . Carpal tunnel syndrome   . Complication of anesthesia 2011   " bleeding from Esophagus after extubated, had to have an endo  . Constipation due to opioid therapy   . DDD (degenerative disc disease), cervical   . Dysrhythmia   . GERD (gastroesophageal reflux disease)   .  History of hiatal hernia   . Hyperlipemia   . Hypertension   . Nasal congestion   . Neuropathy   . Osteopenia   . Polymyalgia rheumatica [facility])     Patient Active Problem List   Diagnosis Date Noted  . Subclinical hyperthyroidism 11/27/2017  . Lower GI bleed   . Rectal bleeding 11/27/2016  . History of colonic polyps 10/12/2016  . Barrett's esophagus without dysplasia 10/12/2016  . Absolute anemia 10/12/2016  . Postoperative fever   . Cluster headache   . Neck pain   . Postoperative anemia due to acute blood loss 03/22/2015  . Sepsis due to undetermined organism ([place]) 03/21/2015  . Essential hypertension 03/21/2015  . Hyperlipidemia 03/21/2015  . Atrial fibrillation, chronic ([place]) 03/21/2015  . GERD (gastroesophageal reflux disease) 03/21/2015  . Polymyalgia rheumatica ([place]) 03/21/2015  . Chronic constipation 03/21/2015  . Lumbar spinal stenosis 03/20/2015    Past Surgical History:  Procedure Laterality Date  . APPENDECTOMY  1951  . BACK SURGERY  2017  . BIOPSY  11/18/2016   Procedure: BIOPSY;  Surgeon: Rogene Houston, MD;  [facility];  Service: Endoscopy;;  esophagus  . COLON RESECTION  1951   after appendectomy  . COLONOSCOPY N/A 11/18/2016   Procedure: COLONOSCOPY;  Surgeon: Rogene Houston, MD;  Location: [facility] END[facility];  Service: Endoscopy;  Laterality: N/A;  . COLONOSCOPY W/ POLYPECTOMY    .  ESOPHAGOGASTRODUODENOSCOPY N/A 11/18/2016   Procedure: ESOPHAGOGASTRODUODENOSCOPY (EGD);  Surgeon: Rogene Houston, MD;  Location: [facility] END[facility];  Service: Endoscopy;  Laterality: N/A;  12:10  . EYE SURGERY Bilateral    cataract  . FINGER SURGERY Right    index- pinned  . HERNIA REPAIR Right   . HERNIA REPAIR Right 1948  . KNEE SURGERY Right 1974   "cleaned it out"  . LUMBAR LAMINECTOMY/DECOMPRESSION MICRODISCECTOMY N/A 03/20/2015   Procedure: Decompressive lumbar laminectomy L3-L5 ;  Surgeon: Erline Levine, MD;  Location: [place] NEURO ORS;  Service: Neurosurgery;   Laterality: N/A;  . POLYPECTOMY  11/18/2016   Procedure: POLYPECTOMY;  Surgeon: Rogene Houston, MD;  Location[facility];  Service: Endoscopy;;  colon   . REPLACEMENT TOTAL KNEE Left 2011  . REPLACEMENT TOTAL KNEE Right 2006  . TONSILLECTOMY          Home Medications    Prior to Admission medications   Medication Sig Start Date End Date Taking? Authorizing Provider  amLODipine-benazepril (LOTREL) 5-10 MG capsule Take 1 capsule by mouth daily.    [provider]  apixaban (ELIQUIS) 5 MG TABS tablet Take 5 mg by mouth 2 (two) times daily.    [provider]  gabapentin (NEURONTIN) 800 MG tablet Take 800 mg by mouth 2 (two) times daily.    [provider]  HYDROcodone-acetaminophen (NORCO) 10-325 MG tablet Take 1 tablet by mouth daily as needed for moderate pain.     [provider]  Iron-FA-B Cmp-C-Biot-Probiotic (FUSION PLUS PO) Take by mouth.    [provider]  lansoprazole (PREVACID) 30 MG capsule Take 30 mg by mouth daily.     [provider]  leflunomide (ARAVA) 20 MG tablet Take 20 mg by mouth daily. 11/07/17   [provider]  metoprolol tartrate (LOPRESSOR) 25 MG tablet Take 25 mg by mouth daily.     [provider]  predniSONE (DELTASONE) 5 MG tablet Take 5 mg by mouth daily with breakfast.    [provider]  simvastatin (ZOCOR) 40 MG tablet Take 40 mg by mouth daily.    [provider]  tamsulosin (FLOMAX) 0.4 MG CAPS capsule Take 0.4 mg by mouth daily.     [provider]  tiZANidine (ZANAFLEX) 4 MG capsule Take 4 mg by mouth at bedtime.    [provider]    Family History Family History  Problem Relation Age of Onset  . Hypertension Mother   . Hypertension Father   . Stroke Father   . Heart failure Father   . Colon cancer Neg Hx     Social History Social History   Tobacco Use  . Smoking status: Former Smoker    Years: 25.00    Last attempt to quit:  05/02/1974    Years since quitting: 43.7  . Smokeless tobacco: Never Used  . Tobacco comment: quit smoking in 1976  Substance Use Topics  . Alcohol use: No  . Drug use: No     Allergies   Patient has no known allergies.   Review of Systems Review of Systems  Constitutional: Negative for chills and fever.  Eyes: Negative for visual disturbance.  Respiratory: Negative for cough, chest tightness and shortness of breath.   Cardiovascular: Negative for chest pain, palpitations and leg swelling.  Gastrointestinal: Negative for abdominal pain, constipation, diarrhea, nausea and vomiting.  Genitourinary: Negative for dysuria and flank pain.  Musculoskeletal: Positive for arthralgias, back pain and myalgias.  Skin: Negative  for color change and rash.  Neurological: Negative for dizziness, syncope, light-headedness and headaches.  All other systems reviewed and are negative.    Physical Exam Updated Vital Signs BP (!) 141/80 (BP Location: Right Arm)   Pulse 81   Temp 98.1 F (36.7 C) (Oral)   Resp 18   SpO2 97%   Physical Exam  Constitutional: He appears well-developed and well-nourished. No distress.  HENT:  Head: Normocephalic and atraumatic.  Mouth/Throat: Oropharynx is clear and moist.  Eyes: Pupils are equal, round, and reactive to light. Conjunctivae and EOM are normal.  Neck: Normal range of motion. Neck supple.  Cardiovascular: Normal rate, regular rhythm, S1 normal and S2 normal.  No murmur heard. Right DP pulses 2+, and left DP pulses 1+.  These are verified on Doppler.  Pulmonary/Chest: Effort normal and breath sounds normal. He has no wheezes. He has no rales.  Abdominal: Soft. He exhibits no distension. There is no tenderness. There is no guarding.  No pulsatile masses or tenderness.  Musculoskeletal: Normal range of motion. He exhibits no edema or deformity.  Spine Exam: Inspection/Palpation: No midline tenderness palpation of cervical, thoracic, or lumbar  spine.  Patient has mild right-sided paraspinal muscular tenderness.  No erythema or edema of right hip or right lower extremity. Strength: 5/5 throughout LE bilaterally (hip flexion/extension, adduction/abduction; knee flexion/extension; foot dorsiflexion/plantarflexion, inversion/eversion; great toe inversion) Sensation: Intact to light touch in proximal and distal LE bilaterally Reflexes: 2+ quadriceps and achilles reflexes Normal and symmetric gait.  Neurological: He is alert.  Cranial nerves grossly intact. Patient moves extremities symmetrically and with good coordination.  Skin: Skin is warm and dry. No rash noted. No erythema.  Psychiatric: He has a normal mood and affect. His behavior is normal. Judgment and thought content normal.  Nursing note and vitals reviewed.    ED Treatments / Results  Labs (all labs ordered are listed, but only abnormal results are displayed) Labs Reviewed  CBC WITH DIFFERENTIAL/PLATELET - Abnormal; Notable for the following components:      Result Value   WBC 12.5 (*)    RBC 3.17 (*)    Hemoglobin 11.0 (*)    HCT 34.2 (*)    MCV 107.9 (*)    MCH 34.7 (*)    RDW 17.5 (*)    Neutro Abs 10.0 (*)    All other components within normal limits  BASIC METABOLIC PANEL - Abnormal; Notable for the following components:   Glucose, Bld 107 (*)    GFR calc non Af Amer 59 (*)    All other components within normal limits    EKG None  Radiology Ct Abdomen Pelvis Wo Contrast  Result Date: 01/06/2018 CLINICAL DATA:  Chronic low back pain. This has worsened since Thursday. History of renal calculi. EXAM: CT ABDOMEN AND PELVIS WITHOUT CONTRAST TECHNIQUE: Multidetector CT imaging of the abdomen and pelvis was performed following the standard protocol without IV contrast. COMPARISON:  None. FINDINGS: Lower chest: Airway thickening is present, suggesting bronchitis or reactive airways disease. Mosaic attenuation in the lung bases possibly from air trapping.  Speckled density peripherally in the lower lobes possibly from calcifications or previously aspirated barium. Coronary atherosclerosis.  Borderline cardiomegaly. Hepatobiliary: Unremarkable Pancreas: Unremarkable Spleen: Unremarkable Adrenals/Urinary Tract: Adrenal glands normal. 2 hypodense lesions of the right kidney are present and 4 hypodense lesions of the left kidney are present. These demonstrate varying degrees of complexity, and are not all simple cysts. For example, the exophytic left renal lesion on image 28/7  has some faint rim calcification, and the exophytic 3.5 cm lesion from the left kidney lower pole on image 36/7 has high internal density. While possibly a combination of simple and complex cysts, renal mass is not excluded on today's noncontrast exam obvious enhancing components of the visualized lesions were not appreciable on 11/06/2014. No urinary tract calculi are identified. Stomach/Bowel: Sigmoid colon diverticulosis without active diverticulitis. Prominent stool throughout the colon favors constipation. There is a notable metal clip in the cecum near the ileocecal valve. Vascular/Lymphatic: Aortoiliac atherosclerotic vascular disease. No pathologic adenopathy identified. Reproductive: Unremarkable Other: No supplemental non-categorized findings. Musculoskeletal: The L5 level has partially transitional morphology. Lumbar spine discussed in detail on entered dedicated lumbar spine requested report. Mild degenerative chondral thinning in both hips. IMPRESSION: 1. No renal calculi are currently identified. 2. Bilateral hypodense renal lesions include at least 2 complex lesions of the left kidney, but these are roughly stable from 11/06/2014 and are probably a mixture of simple and mildly complex cysts. 3. Sigmoid colon diverticulosis. 4.  Prominent stool throughout the colon favors constipation. 5.  Aortic Atherosclerosis (ICD10-I70.0). 6. Airway thickening is present, suggesting bronchitis or  reactive airways disease. Probable air trapping in the lung bases. 7. Lumbar spine findings are discussed under separate report. Electronically Signed   By: Van Clines M.D.   On: 01/06/2018 15:18   Ct L-spine No Charge  Result Date: 01/06/2018 CLINICAL DATA:  Chronic low back pain, worsening since Thursday. History of kidney stones. EXAM: CT LUMBAR SPINE WITHOUT CONTRAST TECHNIQUE: Multidetector CT imaging of the lumbar spine was performed without intravenous contrast administration. Multiplanar CT image reconstructions were also generated. COMPARISON:  Radiographs dated 12/29/2017 FINDINGS: Segmentation: The lowest lumbar type non-rib-bearing vertebra is labeled as L5. Alignment: No vertebral subluxation is observed. Vertebrae: Posterior decompression at all levels between L3 and S1. Vacuum disc phenomenon at L1-2 and L3-4. Considerable loss of intervertebral disc height at L3-4 and L4-5 with associated degenerative endplate findings. Intervertebral spurring at all levels in the lumbar spine. No lumbar spine fracture is identified. However, there is a small chronically fragmented spur in the right neural foramen and lateral extraforaminal space at L3-4. Paraspinal and other soft tissues: Please see CT the abdomen report. Disc levels: T12-L1: Unremarkable. L1-2: Borderline central narrowing of the thecal sac due to disc bulge. L2-3: Mild central narrowing of the thecal sac and mild right foraminal stenosis due to right eccentric disc bulge and facet arthropathy. L3-4: Prominent right and moderate to prominent left foraminal impingement due to facet arthropathy, ligamentum flavum redundancy, intervertebral spur, and a suspected right lateral recess disc fragment above the intervertebral level which is thought to likely impinge on the right L3 nerve roots. L4-5: Mild left and borderline right foraminal stenosis due to facet and intervertebral spurring along with disc bulge. L5-S1: No impingement.  IMPRESSION: 1. Lumbar spondylosis and degenerative disc disease, causing prominent impingement at L3-4; and mild impingement at L2-3 and L4-5, as detailed above. 2. Prior posterior decompression at all levels between L3 and S1. Electronically Signed   By: Van Clines M.D.   On: 01/06/2018 15:11    Procedures Procedures (including critical care time)  Medications Ordered in ED Medications  morphine 4 MG/ML injection 4 mg (has no administration in time range)     Initial Impression / Assessment and Plan / ED Course  I have reviewed the triage vital signs and the nursing notes.  Pertinent labs & imaging results that were available during my care of the  patient were reviewed by me and considered in my medical decision making (see chart for details).  Clinical Course as of Jan 06 1721  Sat Jan 06, 2018  1537 Pt with history of elevated WBC count in May 2019. Will need PCP follow up and possibly hem/onc if persisting.  WBC(!): 12.5 [AM]  1627 Stable. Pt had history of GI bleed in 2019.   Hemoglobin(!): 11.0 [AM]    Clinical Course User Index [AM] Albesa Seen, PA-C    Patient denies any concerning symptoms suggestive of cauda equina requiring urgent imaging at this time such as loss of sensation in the lower extremities, lower extremity weakness, loss of bowel or bladder control, saddle anesthesia, urinary retention, fever/chills, IVDU. Exam demonstrated no  weakness on exam today. No preceding injury or trauma to suggest acute fracture. Doubt pelvic or urinary pathology for patient's acute back pain, as patient denies urinary symptoms, has no CVA tenderness, and did not have evidence of nephrolithiasis on CT. Doubt AAA as cause of patient's back pain as patient had no abdominal TTP, and has intact distal pulses, and no evidence of aneurysms on CT.   Patient CT scan and L-spine no charge did demonstrate slight increase in hypodense nodules of the kidneys, the patient was referred  for follow-up of these.  Discussed them with patient.  Patient also had evidence of prominent impingement at the L3-L4 level.  Patient's pain managed and treated with Lidoderm patch.  Patient has chronic opiate therapy at home for back pain.  Patient given strict return precautions for any symptoms indicating worsening neurologic function in the lower extremities.   Patient did have leukocytosis today up to 12.5.  Patient has a history going back to May 2019.  No tachycardia, fever, or infectious symptoms today to suggest leukocytosis related to back pain. Will have patient follow-up with primary care provider.  This is a shared visit with Dr. Dorie Rank. Patient was independently evaluated by this attending physician. Attending physician consulted in evaluation and discharge management.  Final Clinical Impressions(s) / ED Diagnoses   Final diagnoses:  Low back pain radiating down leg  Leukocytosis, unspecified type  Macrocytic anemia  Renal lesion    ED Discharge Orders         Ordered    Camphor-Menthol-Methyl Sal 1.2-5.7-6.3 % [facility]  Daily     01/06/18 1627           Tamala Julian 01/06/18 1727    Dorie Rank, MD 01/09/18 [phone]

## 2018-01-06 NOTE — ED Notes (Signed)
Pt back from CT

## 2018-01-06 NOTE — ED Provider Notes (Signed)
Medical screening examination/treatment/procedure(s) were conducted as a shared visit with non-physician practitioner(s) and myself.  I personally evaluated the patient during the encounter.  Pt presents to the ED with radicular back pain.  Sx not reproducible.  Abdomen is soft and nontender.  No pulsatile mass appreciated.  Will check labs, xrays.   Dorie Rank, MD 01/06/18 [phone]

## 2018-01-06 NOTE — ED Triage Notes (Signed)
Pt presents for evaluation of persistent R back and leg pain worsening x 1 week. Sees Dr. Vertell Limber, had appointment last Friday. Has norco at home but does not relieve pain. Patient denies numbness or tingling to leg or foot. No loss of bowel or bladder.

## 2018-01-06 NOTE — ED Notes (Signed)
Patient transported to CT 

## 2018-01-06 NOTE — Discharge Instructions (Addendum)
Please see the information and instructions below regarding your visit.  Your diagnoses today include:  1. Low back pain radiating down leg    About diagnosis. Most episodes of acute low back pain are self-limited. Your exam was reassuring today that the source of your pain is not affecting the spinal cord and nerves that originate in the spinal cord.   If you have a history of disc herniation or arthritis in your spine, the nerves exiting the spine on one side get inflamed. This can cause severe pain. We call this radiculopathy. We do not always know what causes the sudden inflammation.  Tests performed today include: See side panel of your discharge paperwork for testing performed today. Vital signs are listed at the bottom of these instructions.   Medications prescribed:    Take any prescribed medications only as prescribed, and any over the counter medications only as directed on the packaging.  Please apply Salon PAS patches to the lower back daily.  Apply warm compresses to lower back will also assist in improving pain.   Home care instructions:   Low back pain gets worse the longer you stay stationary. Please keep moving and walking as tolerated. There are exercises included in this packet to perform as tolerated for your low back pain.   Apply heat to the areas that are painful. Avoid twisting or bending your trunk to lift something. Do not lift anything above 25 lbs while recovering from this flare of low back pain.  Please follow any educational materials contained in this packet.   Follow-up instructions: Please follow-up with your primary care provider in one week for further evaluation of your symptoms if they are not completely improved.   Please follow up with Dr. Vertell Limber on Monday. I sent his office an email.   Return instructions:  Please return to the Emergency Department if you experience worsening symptoms.  Please return for any fever or chills in the setting of  your back pain, weakness in the muscles of the legs, numbness in your legs and feet that is new or changing, numbness in the area where you wipe, retention of your urine, loss of bowel or bladder control, or problems with walking. Please return if you have any other emergent concerns.  Additional Information:   Your vital signs today were: BP 123/72    Pulse 70    Temp 98.1 F (36.7 C) (Oral)    Resp 18    Ht 5\' 11"  (1.803 m)    Wt 82.1 kg    SpO2 96%    BMI 25.24 kg/m  If your blood pressure (BP) was elevated on multiple readings during this visit above 130 for the top number or above 80 for the bottom number, please have this repeated by your primary care provider within one month. --------------  Thank you for allowing [place] to participate in your care today.

## 2018-01-06 NOTE — ED Notes (Signed)
ED Provider at bedside. 

## 2018-01-17 ENCOUNTER — Other Ambulatory Visit: Payer: Self-pay | Admitting: Neurosurgery

## 2018-01-19 ENCOUNTER — Encounter (HOSPITAL_COMMUNITY): Payer: Self-pay | Admitting: *Deleted

## 2018-01-19 ENCOUNTER — Other Ambulatory Visit: Payer: Self-pay

## 2018-01-19 NOTE — Progress Notes (Signed)
Kevin Carlson denies chest pain, states that he has some shortness of breath due to back pain.  Patient has a history of Afib, is taking Eliquis, cardiologist is Dr Toula Moos in [place].  Patient reports that last dose of Eliquis was today.  I requested records from Dr Nolberto Hanlon office

## 2018-01-21 NOTE — Anesthesia Preprocedure Evaluation (Addendum)
Anesthesia Evaluation   Patient awake    Reviewed: Allergy & Precautions, NPO status , Patient's Chart, lab work & pertinent test results  Airway Mallampati: II  TM Distance: >3 FB Neck ROM: Full    Dental no notable dental hx. (+) Teeth Intact, Dental Advisory Given   Pulmonary former smoker,    Pulmonary exam normal breath sounds clear to auscultation       Cardiovascular Exercise Tolerance: Good hypertension, Pt. on medications Normal cardiovascular exam+ dysrhythmias Atrial Fibrillation  Rhythm:Regular Rate:Normal     Neuro/Psych    GI/Hepatic GERD  ,  Endo/Other    Renal/GU      Musculoskeletal  (+) Arthritis ,   Abdominal   Peds  Hematology  (+) anemia ,   Anesthesia Other Findings   Reproductive/Obstetrics                            Lab Results  Component Value Date   WBC 12.5 (H) 01/06/2018   HGB 11.0 (L) 01/06/2018   HCT 34.2 (L) 01/06/2018   MCV 107.9 (H) 01/06/2018   PLT 197 01/06/2018    Anesthesia Physical Anesthesia Plan  ASA: III  Anesthesia Plan: General   Post-op Pain Management:    Induction: Intravenous  PONV Risk Score and Plan: 2 and Treatment may vary due to age or medical condition, Dexamethasone and Ondansetron  Airway Management Planned: Oral ETT  Additional Equipment:   Intra-op Plan:   Post-operative Plan: Extubation in OR  Informed Consent: I have reviewed the patients History and Physical, chart, labs and discussed the procedure including the risks, benefits and alternatives for the proposed anesthesia with the patient or authorized representative who has indicated his/her understanding and acceptance.   Dental advisory given  Plan Discussed with:   Anesthesia Plan Comments:         Anesthesia Quick Evaluation

## 2018-01-22 ENCOUNTER — Other Ambulatory Visit: Payer: Self-pay

## 2018-01-22 ENCOUNTER — Inpatient Hospital Stay (HOSPITAL_COMMUNITY)
Admission: RE | Disposition: A | Discharge status: Home / Self Care | Payer: Self-pay | Source: Ambulatory Visit | Attending: Neurosurgery

## 2018-01-22 ENCOUNTER — Inpatient Hospital Stay (HOSPITAL_COMMUNITY)
Admission: RE | Admit: 2018-01-22 | Discharge: 2018-01-24 | DRG: 455 | Disposition: A | Discharge status: Home / Self Care | Payer: Medicare Other | Source: Ambulatory Visit | Attending: Neurosurgery | Admitting: Neurosurgery

## 2018-01-22 ENCOUNTER — Inpatient Hospital Stay (HOSPITAL_COMMUNITY): Payer: Medicare Other

## 2018-01-22 ENCOUNTER — Encounter (HOSPITAL_COMMUNITY): Payer: Self-pay | Admitting: Orthopedic Surgery

## 2018-01-22 ENCOUNTER — Inpatient Hospital Stay (HOSPITAL_COMMUNITY): Payer: Medicare Other | Admitting: Certified Registered Nurse Anesthetist

## 2018-01-22 DIAGNOSIS — K219 Gastro-esophageal reflux disease without esophagitis: Secondary | ICD-10-CM | POA: Diagnosis present

## 2018-01-22 DIAGNOSIS — M4316 Spondylolisthesis, lumbar region: Secondary | ICD-10-CM | POA: Diagnosis present

## 2018-01-22 DIAGNOSIS — M5126 Other intervertebral disc displacement, lumbar region: Secondary | ICD-10-CM | POA: Diagnosis present

## 2018-01-22 DIAGNOSIS — M5416 Radiculopathy, lumbar region: Secondary | ICD-10-CM | POA: Diagnosis present

## 2018-01-22 DIAGNOSIS — M48062 Spinal stenosis, lumbar region with neurogenic claudication: Secondary | ICD-10-CM | POA: Diagnosis present

## 2018-01-22 DIAGNOSIS — M48061 Spinal stenosis, lumbar region without neurogenic claudication: Secondary | ICD-10-CM | POA: Diagnosis present

## 2018-01-22 DIAGNOSIS — Z87891 Personal history of nicotine dependence: Secondary | ICD-10-CM | POA: Diagnosis not present

## 2018-01-22 DIAGNOSIS — Z79899 Other long term (current) drug therapy: Secondary | ICD-10-CM | POA: Diagnosis not present

## 2018-01-22 DIAGNOSIS — I10 Essential (primary) hypertension: Secondary | ICD-10-CM | POA: Diagnosis present

## 2018-01-22 DIAGNOSIS — Z419 Encounter for procedure for purposes other than remedying health state, unspecified: Secondary | ICD-10-CM

## 2018-01-22 HISTORY — DX: Pneumonia, unspecified organism: J18.9

## 2018-01-22 HISTORY — DX: Personal history of urinary calculi: Z87.442

## 2018-01-22 LAB — BASIC METABOLIC PANEL
ANION GAP: 11 (ref 5–15)
BUN: 13 mg/dL (ref 8–23)
CALCIUM: 9 mg/dL (ref 8.9–10.3)
CO2: 24 mmol/L (ref 22–32)
Chloride: 106 mmol/L (ref 98–111)
Creatinine, Ser: 0.97 mg/dL (ref 0.61–1.24)
GFR calc Af Amer: >60 mL/min (ref >60)
Glucose, Bld: 99 mg/dL (ref 70–99)
POTASSIUM: 4 mmol/L (ref 3.5–5.1)
SODIUM: 141 mmol/L (ref 135–145)

## 2018-01-22 LAB — CBC
HCT: 35.9 % — ABNORMAL LOW (ref 39.0–52.0)
Hemoglobin: 11.3 g/dL — ABNORMAL LOW (ref 13.0–17.0)
MCH: 34.6 pg — ABNORMAL HIGH (ref 26.0–34.0)
MCHC: 31.5 g/dL (ref 30.0–36.0)
MCV: 109.8 fL — ABNORMAL HIGH (ref 78.0–100.0)
PLATELETS: 300 10*3/uL (ref 150–400)
RBC: 3.27 MIL/uL — AB (ref 4.22–5.81)
RDW: 16.7 % — AB (ref 11.5–15.5)
WBC: 16.4 10*3/uL — AB (ref 4.0–10.5)

## 2018-01-22 LAB — TYPE AND SCREEN
ABO/RH(D): A POS
ANTIBODY SCREEN: NEGATIVE

## 2018-01-22 LAB — ABO/RH: ABO/RH(D): A POS

## 2018-01-22 LAB — PROTIME-INR
INR: 0.96
Prothrombin Time: 12.7 seconds (ref 11.4–15.2)

## 2018-01-22 LAB — SURGICAL PCR SCREEN
MRSA, PCR: NEGATIVE
Staphylococcus aureus: NEGATIVE

## 2018-01-22 SURGERY — POSTERIOR LUMBAR FUSION 2 LEVEL
Anesthesia: General | Site: Back

## 2018-01-22 MED ORDER — EPHEDRINE 5 MG/ML INJ
INTRAVENOUS | Status: AC
  Filled 2018-01-22: qty 10

## 2018-01-22 MED ORDER — FENTANYL CITRATE (PF) 250 MCG/5ML IJ SOLN
INTRAMUSCULAR | Status: DC | PRN
  Administered 2018-01-22 (×4): 50 ug via INTRAVENOUS

## 2018-01-22 MED ORDER — GABAPENTIN 800 MG PO TABS: 800.0000 mg | ORAL_TABLET | Freq: Two times a day (BID) | ORAL | Status: DC

## 2018-01-22 MED ORDER — ONDANSETRON HCL 4 MG/2ML IJ SOLN
INTRAMUSCULAR | Status: AC
  Filled 2018-01-22: qty 2

## 2018-01-22 MED ORDER — OXYCODONE HCL 5 MG PO TABS
5.0000 mg | ORAL_TABLET | ORAL | Status: DC | PRN
  Administered 2018-01-22 – 2018-01-23 (×7): 5 mg via ORAL
  Filled 2018-01-22 (×7): qty 1

## 2018-01-22 MED ORDER — PHENYLEPHRINE 40 MCG/ML (10ML) SYRINGE FOR IV PUSH (FOR BLOOD PRESSURE SUPPORT)
PREFILLED_SYRINGE | INTRAVENOUS | Status: DC | PRN
  Administered 2018-01-22: 80 ug via INTRAVENOUS
  Administered 2018-01-22: 120 ug via INTRAVENOUS
  Administered 2018-01-22: 40 ug via INTRAVENOUS
  Administered 2018-01-22 (×2): 80 ug via INTRAVENOUS

## 2018-01-22 MED ORDER — HYDROCODONE-ACETAMINOPHEN 10-325 MG PO TABS: 1.0000 | ORAL_TABLET | Freq: Every day | ORAL | Status: DC | PRN

## 2018-01-22 MED ORDER — GABAPENTIN 400 MG PO CAPS
800.0000 mg | ORAL_CAPSULE | Freq: Two times a day (BID) | ORAL | Status: DC
  Administered 2018-01-22 – 2018-01-23 (×3): 800 mg via ORAL
  Filled 2018-01-22 (×3): qty 2

## 2018-01-22 MED ORDER — TAMSULOSIN HCL 0.4 MG PO CAPS
0.4000 mg | ORAL_CAPSULE | Freq: Every day | ORAL | Status: DC
  Administered 2018-01-23: 0.4 mg via ORAL
  Filled 2018-01-22: qty 1

## 2018-01-22 MED ORDER — BISACODYL 10 MG RE SUPP: 10.0000 mg | Freq: Every day | RECTAL | Status: DC | PRN

## 2018-01-22 MED ORDER — KETOROLAC TROMETHAMINE 30 MG/ML IJ SOLN
INTRAMUSCULAR | Status: AC
  Filled 2018-01-22: qty 1

## 2018-01-22 MED ORDER — LIDOCAINE 2% (20 MG/ML) 5 ML SYRINGE
INTRAMUSCULAR | Status: AC
  Filled 2018-01-22: qty 5

## 2018-01-22 MED ORDER — FENTANYL CITRATE (PF) 100 MCG/2ML IJ SOLN
25.0000 ug | INTRAMUSCULAR | Status: DC | PRN
  Administered 2018-01-22: 50 ug via INTRAVENOUS
  Administered 2018-01-22 (×2): 25 ug via INTRAVENOUS

## 2018-01-22 MED ORDER — ACETAMINOPHEN 650 MG RE SUPP: 650.0000 mg | RECTAL | Status: DC | PRN

## 2018-01-22 MED ORDER — TIZANIDINE HCL 4 MG PO TABS
4.0000 mg | ORAL_TABLET | Freq: Every day | ORAL | Status: DC
  Administered 2018-01-22 – 2018-01-23 (×2): 4 mg via ORAL
  Filled 2018-01-22 (×2): qty 1

## 2018-01-22 MED ORDER — FLEET ENEMA 7-19 GM/118ML RE ENEM: 1.0000 | ENEMA | Freq: Once | RECTAL | Status: DC | PRN

## 2018-01-22 MED ORDER — LIDOCAINE-EPINEPHRINE 1 %-1:100000 IJ SOLN
INTRAMUSCULAR | Status: AC
  Filled 2018-01-22: qty 1

## 2018-01-22 MED ORDER — DEXMEDETOMIDINE HCL IN NACL 200 MCG/50ML IV SOLN
INTRAVENOUS | Status: AC
  Filled 2018-01-22: qty 50

## 2018-01-22 MED ORDER — ROCURONIUM BROMIDE 50 MG/5ML IV SOSY
PREFILLED_SYRINGE | INTRAVENOUS | Status: AC
  Filled 2018-01-22: qty 10

## 2018-01-22 MED ORDER — DOCUSATE SODIUM 100 MG PO CAPS
100.0000 mg | ORAL_CAPSULE | Freq: Two times a day (BID) | ORAL | Status: DC
  Administered 2018-01-22 – 2018-01-23 (×3): 100 mg via ORAL
  Filled 2018-01-22 (×3): qty 1

## 2018-01-22 MED ORDER — THROMBIN 5000 UNITS EX SOLR
CUTANEOUS | Status: AC
  Filled 2018-01-22: qty 5000

## 2018-01-22 MED ORDER — BUPIVACAINE HCL (PF) 0.5 % IJ SOLN
INTRAMUSCULAR | Status: DC | PRN
  Administered 2018-01-22: 5 mL

## 2018-01-22 MED ORDER — METHOCARBAMOL 1000 MG/10ML IJ SOLN
500.0000 mg | Freq: Four times a day (QID) | INTRAVENOUS | Status: DC | PRN
  Filled 2018-01-22: qty 5

## 2018-01-22 MED ORDER — METHOCARBAMOL 500 MG PO TABS
500.0000 mg | ORAL_TABLET | Freq: Four times a day (QID) | ORAL | Status: DC | PRN
  Administered 2018-01-22 – 2018-01-24 (×5): 500 mg via ORAL
  Filled 2018-01-22 (×5): qty 1

## 2018-01-22 MED ORDER — LIDOCAINE-EPINEPHRINE 1 %-1:100000 IJ SOLN
INTRAMUSCULAR | Status: DC | PRN
  Administered 2018-01-22: 5 mL

## 2018-01-22 MED ORDER — 0.9 % SODIUM CHLORIDE (POUR BTL) OPTIME
TOPICAL | Status: DC | PRN
  Administered 2018-01-22: 1000 mL

## 2018-01-22 MED ORDER — LEFLUNOMIDE 20 MG PO TABS: 20.0000 mg | ORAL_TABLET | Freq: Every day | ORAL | Status: DC

## 2018-01-22 MED ORDER — EPHEDRINE SULFATE-NACL 50-0.9 MG/10ML-% IV SOSY
PREFILLED_SYRINGE | INTRAVENOUS | Status: DC | PRN
  Administered 2018-01-22 (×5): 10 mg via INTRAVENOUS

## 2018-01-22 MED ORDER — FAMOTIDINE 20 MG PO TABS
20.0000 mg | ORAL_TABLET | Freq: Two times a day (BID) | ORAL | Status: DC
  Administered 2018-01-22 – 2018-01-23 (×3): 20 mg via ORAL
  Filled 2018-01-22 (×3): qty 1

## 2018-01-22 MED ORDER — PHENOL 1.4 % MT LIQD: 1.0000 | OROMUCOSAL | Status: DC | PRN

## 2018-01-22 MED ORDER — SIMVASTATIN 20 MG PO TABS
40.0000 mg | ORAL_TABLET | Freq: Every day | ORAL | Status: DC
  Administered 2018-01-22 – 2018-01-23 (×2): 40 mg via ORAL
  Filled 2018-01-22 (×2): qty 2

## 2018-01-22 MED ORDER — AMLODIPINE BESY-BENAZEPRIL HCL 5-10 MG PO CAPS: 1.0000 | ORAL_CAPSULE | Freq: Every day | ORAL | Status: DC

## 2018-01-22 MED ORDER — ONDANSETRON HCL 4 MG/2ML IJ SOLN: 4.0000 mg | Freq: Once | INTRAMUSCULAR | Status: DC | PRN

## 2018-01-22 MED ORDER — FAMOTIDINE IN NACL 20-0.9 MG/50ML-% IV SOLN: 20.0000 mg | Freq: Two times a day (BID) | INTRAVENOUS | Status: DC

## 2018-01-22 MED ORDER — CHLORHEXIDINE GLUCONATE CLOTH 2 % EX PADS: 6.0000 | MEDICATED_PAD | Freq: Once | CUTANEOUS | Status: DC

## 2018-01-22 MED ORDER — HYDROCODONE-ACETAMINOPHEN 5-325 MG PO TABS
2.0000 | ORAL_TABLET | ORAL | Status: DC | PRN
  Administered 2018-01-23 – 2018-01-24 (×3): 2 via ORAL
  Filled 2018-01-22 (×3): qty 2

## 2018-01-22 MED ORDER — ONDANSETRON HCL 4 MG/2ML IJ SOLN: 4.0000 mg | Freq: Four times a day (QID) | INTRAMUSCULAR | Status: DC | PRN

## 2018-01-22 MED ORDER — METOPROLOL SUCCINATE ER 25 MG PO TB24
25.0000 mg | ORAL_TABLET | Freq: Every day | ORAL | Status: DC
  Administered 2018-01-23: 25 mg via ORAL
  Filled 2018-01-22: qty 1

## 2018-01-22 MED ORDER — ALBUMIN HUMAN 5 % IV SOLN
INTRAVENOUS | Status: DC | PRN
  Administered 2018-01-22: 12:00:00 via INTRAVENOUS

## 2018-01-22 MED ORDER — POLYETHYLENE GLYCOL 3350 17 G PO PACK: 17.0000 g | PACK | Freq: Every day | ORAL | Status: DC | PRN

## 2018-01-22 MED ORDER — LACTATED RINGERS IV SOLN
INTRAVENOUS | Status: DC
  Administered 2018-01-22: 08:00:00 via INTRAVENOUS

## 2018-01-22 MED ORDER — ONDANSETRON HCL 4 MG/2ML IJ SOLN
INTRAMUSCULAR | Status: DC | PRN
  Administered 2018-01-22: 4 mg via INTRAVENOUS

## 2018-01-22 MED ORDER — DEXAMETHASONE SODIUM PHOSPHATE 10 MG/ML IJ SOLN
INTRAMUSCULAR | Status: AC
  Filled 2018-01-22: qty 1

## 2018-01-22 MED ORDER — THROMBIN (RECOMBINANT) 20000 UNITS EX SOLR
CUTANEOUS | Status: AC
  Filled 2018-01-22: qty 20000

## 2018-01-22 MED ORDER — PROPOFOL 10 MG/ML IV BOLUS
INTRAVENOUS | Status: DC | PRN
  Administered 2018-01-22: 70 mg via INTRAVENOUS

## 2018-01-22 MED ORDER — DEXMEDETOMIDINE HCL 200 MCG/2ML IV SOLN
INTRAVENOUS | Status: DC | PRN
  Administered 2018-01-22 (×2): 12 ug via INTRAVENOUS
  Administered 2018-01-22: 16 ug via INTRAVENOUS

## 2018-01-22 MED ORDER — FENTANYL CITRATE (PF) 100 MCG/2ML IJ SOLN
INTRAMUSCULAR | Status: AC
  Administered 2018-01-22: 25 ug via INTRAVENOUS
  Filled 2018-01-22: qty 2

## 2018-01-22 MED ORDER — DEXAMETHASONE SODIUM PHOSPHATE 10 MG/ML IJ SOLN
INTRAMUSCULAR | Status: DC | PRN
  Administered 2018-01-22: 10 mg via INTRAVENOUS

## 2018-01-22 MED ORDER — LEFLUNOMIDE 20 MG PO TABS
20.0000 mg | ORAL_TABLET | Freq: Every day | ORAL | Status: DC
  Administered 2018-01-23: 20 mg via ORAL
  Filled 2018-01-22 (×2): qty 1

## 2018-01-22 MED ORDER — FENTANYL CITRATE (PF) 250 MCG/5ML IJ SOLN
INTRAMUSCULAR | Status: AC
  Filled 2018-01-22: qty 5

## 2018-01-22 MED ORDER — SODIUM CHLORIDE 0.9 % IV SOLN: 250.0000 mL | INTRAVENOUS | Status: DC

## 2018-01-22 MED ORDER — BUPIVACAINE LIPOSOME 1.3 % IJ SUSP
20.0000 mL | INTRAMUSCULAR | Status: AC
  Administered 2018-01-22: 20 mL
  Filled 2018-01-22: qty 20

## 2018-01-22 MED ORDER — PREDNISONE 5 MG PO TABS
5.0000 mg | ORAL_TABLET | Freq: Every day | ORAL | Status: DC
  Administered 2018-01-23 – 2018-01-24 (×2): 5 mg via ORAL
  Filled 2018-01-22 (×2): qty 1

## 2018-01-22 MED ORDER — PHENYLEPHRINE 40 MCG/ML (10ML) SYRINGE FOR IV PUSH (FOR BLOOD PRESSURE SUPPORT)
PREFILLED_SYRINGE | INTRAVENOUS | Status: AC
  Filled 2018-01-22: qty 10

## 2018-01-22 MED ORDER — CEFAZOLIN SODIUM-DEXTROSE 2-4 GM/100ML-% IV SOLN
2.0000 g | INTRAVENOUS | Status: AC
  Administered 2018-01-22: 2 g via INTRAVENOUS
  Filled 2018-01-22: qty 100

## 2018-01-22 MED ORDER — LACTATED RINGERS IV SOLN
INTRAVENOUS | Status: DC | PRN
  Administered 2018-01-22 (×2): via INTRAVENOUS

## 2018-01-22 MED ORDER — FERROUS SULFATE 325 (65 FE) MG PO TABS
325.0000 mg | ORAL_TABLET | Freq: Every day | ORAL | Status: DC
  Administered 2018-01-23 – 2018-01-24 (×2): 325 mg via ORAL
  Filled 2018-01-22 (×2): qty 1

## 2018-01-22 MED ORDER — ROCURONIUM BROMIDE 10 MG/ML (PF) SYRINGE
PREFILLED_SYRINGE | INTRAVENOUS | Status: DC | PRN
  Administered 2018-01-22 (×2): 20 mg via INTRAVENOUS
  Administered 2018-01-22: 50 mg via INTRAVENOUS

## 2018-01-22 MED ORDER — SODIUM CHLORIDE 0.9% FLUSH
3.0000 mL | Freq: Two times a day (BID) | INTRAVENOUS | Status: DC
  Administered 2018-01-22: 3 mL via INTRAVENOUS

## 2018-01-22 MED ORDER — ACETAMINOPHEN 500 MG PO TABS
1000.0000 mg | ORAL_TABLET | Freq: Once | ORAL | Status: AC
  Administered 2018-01-22: 1000 mg via ORAL
  Filled 2018-01-22: qty 2

## 2018-01-22 MED ORDER — PROPOFOL 10 MG/ML IV BOLUS
INTRAVENOUS | Status: AC
  Filled 2018-01-22: qty 40

## 2018-01-22 MED ORDER — CEFAZOLIN SODIUM-DEXTROSE 2-4 GM/100ML-% IV SOLN
2.0000 g | Freq: Three times a day (TID) | INTRAVENOUS | Status: AC
  Administered 2018-01-22 – 2018-01-23 (×2): 2 g via INTRAVENOUS
  Filled 2018-01-22 (×2): qty 100

## 2018-01-22 MED ORDER — SODIUM CHLORIDE 0.9 % IV SOLN
INTRAVENOUS | Status: DC | PRN
  Administered 2018-01-22: 12:00:00 via INTRAVENOUS
  Administered 2018-01-22: 25 ug/min via INTRAVENOUS

## 2018-01-22 MED ORDER — LIDOCAINE 2% (20 MG/ML) 5 ML SYRINGE
INTRAMUSCULAR | Status: DC | PRN
  Administered 2018-01-22: 100 mg via INTRAVENOUS

## 2018-01-22 MED ORDER — METOPROLOL SUCCINATE ER 25 MG PO TB24: 25.0000 mg | ORAL_TABLET | Freq: Every day | ORAL | Status: DC

## 2018-01-22 MED ORDER — MORPHINE SULFATE (PF) 2 MG/ML IV SOLN: 2.0000 mg | INTRAVENOUS | Status: DC | PRN

## 2018-01-22 MED ORDER — ZOLPIDEM TARTRATE 5 MG PO TABS: 5.0000 mg | ORAL_TABLET | Freq: Every evening | ORAL | Status: DC | PRN

## 2018-01-22 MED ORDER — AMLODIPINE BESYLATE 5 MG PO TABS
5.0000 mg | ORAL_TABLET | Freq: Every day | ORAL | Status: DC
  Administered 2018-01-23: 5 mg via ORAL
  Filled 2018-01-22: qty 1

## 2018-01-22 MED ORDER — KETOROLAC TROMETHAMINE 15 MG/ML IJ SOLN
15.0000 mg | Freq: Once | INTRAMUSCULAR | Status: AC | PRN
  Administered 2018-01-22: 15 mg via INTRAVENOUS

## 2018-01-22 MED ORDER — PANTOPRAZOLE SODIUM 40 MG PO TBEC
40.0000 mg | DELAYED_RELEASE_TABLET | Freq: Every day | ORAL | Status: DC
  Administered 2018-01-23: 40 mg via ORAL
  Filled 2018-01-22: qty 1

## 2018-01-22 MED ORDER — SODIUM CHLORIDE 0.9% FLUSH: 3.0000 mL | INTRAVENOUS | Status: DC | PRN

## 2018-01-22 MED ORDER — MENTHOL 3 MG MT LOZG: 1.0000 | LOZENGE | OROMUCOSAL | Status: DC | PRN

## 2018-01-22 MED ORDER — KCL IN DEXTROSE-NACL 20-5-0.45 MEQ/L-%-% IV SOLN: INTRAVENOUS | Status: DC

## 2018-01-22 MED ORDER — SIMVASTATIN 20 MG PO TABS: 40.0000 mg | ORAL_TABLET | Freq: Every day | ORAL | Status: DC

## 2018-01-22 MED ORDER — METHYLPREDNISOLONE ACETATE 80 MG/ML IJ SUSP
INTRAMUSCULAR | Status: AC
  Filled 2018-01-22: qty 1

## 2018-01-22 MED ORDER — ONDANSETRON HCL 4 MG PO TABS: 4.0000 mg | ORAL_TABLET | Freq: Four times a day (QID) | ORAL | Status: DC | PRN

## 2018-01-22 MED ORDER — THROMBIN 5000 UNITS EX SOLR
OROMUCOSAL | Status: DC | PRN
  Administered 2018-01-22: 5 mL via TOPICAL

## 2018-01-22 MED ORDER — THROMBIN 20000 UNITS EX SOLR
CUTANEOUS | Status: DC | PRN
  Administered 2018-01-22: 20 mL via TOPICAL

## 2018-01-22 MED ORDER — ALUM & MAG HYDROXIDE-SIMETH 200-200-20 MG/5ML PO SUSP: 30.0000 mL | Freq: Four times a day (QID) | ORAL | Status: DC | PRN

## 2018-01-22 MED ORDER — ACETAMINOPHEN 325 MG PO TABS: 650.0000 mg | ORAL_TABLET | ORAL | Status: DC | PRN

## 2018-01-22 MED ORDER — SUGAMMADEX SODIUM 200 MG/2ML IV SOLN
INTRAVENOUS | Status: DC | PRN
  Administered 2018-01-22: 175 mg via INTRAVENOUS

## 2018-01-22 MED ORDER — BUPIVACAINE HCL (PF) 0.5 % IJ SOLN
INTRAMUSCULAR | Status: AC
  Filled 2018-01-22: qty 30

## 2018-01-22 MED ORDER — BENAZEPRIL HCL 10 MG PO TABS
10.0000 mg | ORAL_TABLET | Freq: Every day | ORAL | Status: DC
  Administered 2018-01-23: 10 mg via ORAL
  Filled 2018-01-22 (×2): qty 1

## 2018-01-22 SURGICAL SUPPLY — 82 items
BASKET BONE COLLECTION (BASKET) ×3 IMPLANT
BLADE CLIPPER SURG (BLADE) IMPLANT
BONE CANC CHIPS 40CC CAN1/2 (Bone Implant) ×3 IMPLANT
BUR MATCHSTICK NEURO 3.0 LAGG (BURR) ×3 IMPLANT
BUR PRECISION FLUTE 5.0 (BURR) ×3 IMPLANT
CANISTER SUCT 3000ML PPV (MISCELLANEOUS) ×3 IMPLANT
CARTRIDGE OIL MAESTRO DRILL (MISCELLANEOUS) ×1 IMPLANT
CHIPS CANC BONE 40CC CAN1/2 (Bone Implant) ×1 IMPLANT
CONT SPEC 4OZ CLIKSEAL STRL BL (MISCELLANEOUS) ×3 IMPLANT
COVER BACK TABLE 60X90IN (DRAPES) ×3 IMPLANT
DECANTER SPIKE VIAL GLASS SM (MISCELLANEOUS) ×3 IMPLANT
DERMABOND ADVANCED (GAUZE/BANDAGES/DRESSINGS) ×2
DERMABOND ADVANCED .7 DNX12 (GAUZE/BANDAGES/DRESSINGS) ×1 IMPLANT
DIFFUSER DRILL AIR PNEUMATIC (MISCELLANEOUS) ×3 IMPLANT
DRAPE C-ARM 42X72 X-RAY (DRAPES) ×3 IMPLANT
DRAPE C-ARMOR (DRAPES) ×3 IMPLANT
DRAPE LAPAROTOMY 100X72X124 (DRAPES) ×3 IMPLANT
DRAPE MICROSCOPE LEICA (MISCELLANEOUS) ×3 IMPLANT
DRAPE SURG 17X23 STRL (DRAPES) ×3 IMPLANT
DRSG OPSITE POSTOP 4X8 (GAUZE/BANDAGES/DRESSINGS) ×3 IMPLANT
DURAPREP 26ML APPLICATOR (WOUND CARE) ×3 IMPLANT
ELECT REM PT RETURN 9FT ADLT (ELECTROSURGICAL) ×3
ELECTRODE REM PT RTRN 9FT ADLT (ELECTROSURGICAL) ×1 IMPLANT
EVACUATOR 1/8 PVC DRAIN (DRAIN) ×3 IMPLANT
GAUZE 4X4 16PLY RFD (DISPOSABLE) IMPLANT
GAUZE SPONGE 4X4 12PLY STRL (GAUZE/BANDAGES/DRESSINGS) IMPLANT
GLOVE BIO SURGEON STRL SZ8 (GLOVE) ×6 IMPLANT
GLOVE BIOGEL PI IND STRL 6.5 (GLOVE) ×3 IMPLANT
GLOVE BIOGEL PI IND STRL 7.0 (GLOVE) ×2 IMPLANT
GLOVE BIOGEL PI IND STRL 8 (GLOVE) ×2 IMPLANT
GLOVE BIOGEL PI IND STRL 8.5 (GLOVE) ×2 IMPLANT
GLOVE BIOGEL PI INDICATOR 6.5 (GLOVE) ×6
GLOVE BIOGEL PI INDICATOR 7.0 (GLOVE) ×4
GLOVE BIOGEL PI INDICATOR 8 (GLOVE) ×4
GLOVE BIOGEL PI INDICATOR 8.5 (GLOVE) ×4
GLOVE ECLIPSE 8.0 STRL XLNG CF (GLOVE) ×6 IMPLANT
GLOVE EXAM NITRILE LRG STRL (GLOVE) IMPLANT
GLOVE EXAM NITRILE XL STR (GLOVE) IMPLANT
GLOVE EXAM NITRILE XS STR PU (GLOVE) IMPLANT
GLOVE SURG SS PI 6.0 STRL IVOR (GLOVE) ×6 IMPLANT
GLOVE SURG SS PI 6.5 STRL IVOR (GLOVE) ×15 IMPLANT
GOWN STRL REUS W/ TWL LRG LVL3 (GOWN DISPOSABLE) ×3 IMPLANT
GOWN STRL REUS W/ TWL XL LVL3 (GOWN DISPOSABLE) ×3 IMPLANT
GOWN STRL REUS W/TWL 2XL LVL3 (GOWN DISPOSABLE) ×6 IMPLANT
GOWN STRL REUS W/TWL LRG LVL3 (GOWN DISPOSABLE) ×6
GOWN STRL REUS W/TWL XL LVL3 (GOWN DISPOSABLE) ×6
HEMOSTAT POWDER KIT SURGIFOAM (HEMOSTASIS) ×3 IMPLANT
INTERBODY TLX 7X11X25 15DEG (Cage) ×2 IMPLANT
KIT BASIN OR (CUSTOM PROCEDURE TRAY) ×3 IMPLANT
KIT POSITION SURG JACKSON T1 (MISCELLANEOUS) ×3 IMPLANT
KIT TURNOVER KIT B (KITS) ×3 IMPLANT
MILL MEDIUM DISP (BLADE) ×3 IMPLANT
NEEDLE HYPO 21X1.5 SAFETY (NEEDLE) ×3 IMPLANT
NEEDLE HYPO 25X1 1.5 SAFETY (NEEDLE) ×3 IMPLANT
NEEDLE SPNL 18GX3.5 QUINCKE PK (NEEDLE) ×3 IMPLANT
NS IRRIG 1000ML POUR BTL (IV SOLUTION) ×3 IMPLANT
OIL CARTRIDGE MAESTRO DRILL (MISCELLANEOUS) ×3
PACK LAMINECTOMY NEURO (CUSTOM PROCEDURE TRAY) ×3 IMPLANT
PAD ARMBOARD 7.5X6 YLW CONV (MISCELLANEOUS) ×9 IMPLANT
PATTIES SURGICAL .5 X.5 (GAUZE/BANDAGES/DRESSINGS) IMPLANT
PATTIES SURGICAL .5 X1 (DISPOSABLE) IMPLANT
PATTIES SURGICAL 1X1 (DISPOSABLE) IMPLANT
ROD RELINE TI LORD 5.5X70 (Rod) ×6 IMPLANT
RUBBERBAND STERILE (MISCELLANEOUS) ×6 IMPLANT
SCREW LOCK RELINE 5.5 TULIP (Screw) ×18 IMPLANT
SCREW RELINE-O POLY 6.5X50MM (Screw) ×6 IMPLANT
SCREW RELINE-O POLY 7.5X50 (Screw) ×8 IMPLANT
SCREW RLINE PLY 2S 50X7.5XPA (Screw) ×4 IMPLANT
SPONGE LAP 4X18 RFD (DISPOSABLE) IMPLANT
SPONGE SURGIFOAM ABS GEL 100 (HEMOSTASIS) ×3 IMPLANT
STAPLER SKIN PROX WIDE 3.9 (STAPLE) IMPLANT
SUT VIC AB 1 CT1 18XBRD ANBCTR (SUTURE) ×2 IMPLANT
SUT VIC AB 1 CT1 8-18 (SUTURE) ×4
SUT VIC AB 2-0 CT1 18 (SUTURE) ×9 IMPLANT
SUT VIC AB 3-0 SH 8-18 (SUTURE) ×6 IMPLANT
SYR 5ML LL (SYRINGE) IMPLANT
SYRINGE 20CC LL (MISCELLANEOUS) ×3 IMPLANT
TLX INTERBODY 7X11X25 15DEG (Cage) ×6 IMPLANT
TOWEL GREEN STERILE (TOWEL DISPOSABLE) ×3 IMPLANT
TOWEL GREEN STERILE FF (TOWEL DISPOSABLE) ×3 IMPLANT
TRAY FOLEY MTR SLVR 16FR STAT (SET/KITS/TRAYS/PACK) ×3 IMPLANT
WATER STERILE IRR 1000ML POUR (IV SOLUTION) ×3 IMPLANT

## 2018-01-22 NOTE — Interval H&P Note (Signed)
History and Physical Interval Note:  01/22/2018 9:08 AM  Kevin Carlson.  has presented today for surgery, with the diagnosis of Lumbar stenosis with neurogenic claudication  The various methods of treatment have been discussed with the patient and family. After consideration of risks, benefits and other options for treatment, the patient has consented to  Procedure(s) with comments: Lumbar 3-4 Lumbar 4-5 Redo decompression with posterior lumbar interbody fusion (N/A) - Lumbar 3-4 Lumbar 4-5 Redo decompression with posterior lumbar interbody fusion as a surgical intervention .  The patient's history has been reviewed, patient examined, no change in status, stable for surgery.  I have reviewed the patient's chart and labs.  Questions were answered to the patient's satisfaction.     Kevin Carlson D

## 2018-01-22 NOTE — Transfer of Care (Signed)
Immediate Anesthesia Transfer of Care Note  Patient: Kevin Carlson.  Procedure(s) Performed: Lumbar three-four Lumbar four-five Redo decompression with transforaminal lumbar interbody fusion (N/A Back)  Patient Location: PACU  Anesthesia Type:General  Level of Consciousness: drowsy and patient cooperative  Airway & Oxygen Therapy: Patient Spontanous Breathing and Patient connected to nasal cannula oxygen  Post-op Assessment: Report given to RN and Post -op Vital signs reviewed and stable  Post vital signs: Reviewed and stable  Last Vitals:  Vitals Value Taken Time  BP 112/48 01/22/2018 12:48 PM  Temp    Pulse 89 01/22/2018 12:50 PM  Resp 19 01/22/2018 12:50 PM  SpO2 94 % 01/22/2018 12:50 PM  Vitals shown include unvalidated device data.  Last Pain:  Vitals:   01/22/18 1247  PainSc: (P) 0-No pain      Patients Stated Pain Goal: 3 (65/68/12 7517)  Complications: No apparent anesthesia complications

## 2018-01-22 NOTE — Anesthesia Procedure Notes (Signed)
Procedure Name: Intubation Date/Time: 01/22/2018 9:12 AM Performed by: Colin Benton, CRNA Pre-anesthesia Checklist: Patient identified, Emergency Drugs available, Suction available and Patient being monitored Patient Re-evaluated:Patient Re-evaluated prior to induction Oxygen Delivery Method: Circle system utilized Preoxygenation: Pre-oxygenation with 100% oxygen Induction Type: IV induction Ventilation: Mask ventilation without difficulty and Oral airway inserted - appropriate to patient size Laryngoscope Size: Sabra Heck and 2 Grade View: Grade I Tube type: Oral Tube size: 8.0 mm Number of attempts: 1 Airway Equipment and Method: Stylet Placement Confirmation: ETT inserted through vocal cords under direct vision,  positive ETCO2 and breath sounds checked- equal and bilateral Secured at: 24 cm Tube secured with: Tape Dental Injury: Teeth and Oropharynx as per pre-operative assessment

## 2018-01-22 NOTE — Plan of Care (Signed)
  Problem: Safety: Goal: Ability to remain free from injury will improve Outcome: Progressing   

## 2018-01-22 NOTE — H&P (Signed)
Patient ID:   [id number] Patient: Kevin Carlson  Date of Birth: Sep 18, 1929 Visit Type: Office Visit   Date: 01/17/2018 09:45 AM Provider: Marchia Meiers. Vertell Limber MD   This 84 year old male presents for Follow Up of back pain and R leg pain.  HISTORY OF PRESENT ILLNESS:  1.  Follow Up of back pain  2.  R leg pain  Patient returns to review his MRI.  Since his visit August 30th, pain exacerbation low back and right leg prompted ER visit.  The patient comes in today in a wheelchair.  He has a great deal of difficulty getting up and around and says that he is having her ran this right leg pain.  He says he used to have relief with sitting but now says that he is getting no relief whatsoever with any physician.  His he went to the emergency room because of severe right leg pain and is no longer having any relief from that.  I reviewed the patient's lumbar MRI which shows a disc herniation with cephalad migrated fragment at the L3-4 level along with severe foraminal stenosis right greater than left at the L3-4 level with moderate foraminal stenosis at the L4-5 level.  He says he is having much worse back and right leg pain.  I have recommended proceeding with decompression and fusion at the L3-4 and L4-5 levels.         Medical/Surgical/Interim History Reviewed, no change.  Last detailed document date:03/04/2015.     PAST MEDICAL HISTORY, SURGICAL HISTORY, FAMILY HISTORY, SOCIAL HISTORY AND REVIEW OF SYSTEMS I have reviewed the patient's past medical, surgical, family and social history as well as the comprehensive review of systems as included on the [facility] history form dated 12/29/2017, which I have signed.  Family History:  Reviewed, no changes.  Last detailed document date:03/04/2015.   Social History: Reviewed, no changes. Last detailed document date: 03/04/2015.    MEDICATIONS: (added, continued or stopped this visit) Started Medication Directions  Instruction Stopped   amlodipine 5 mg-benazepril 10 mg capsule take 1 capsule by oral route  every day     lansoprazole 30 mg capsule,delayed release take 1 capsule by oral route  every day before a meal     metoprolol tartrate 25 mg tablet take 1 tablet by oral route 2 times every day     multivitamin tablet take 1 by oral route  every day     Norco 10 mg-325 mg tablet take 1 tablet by oral route 3 times every day as needed for pain     prednisone 5 mg tablet take 1 tablet by oral route  every day     simvastatin 40 mg tablet take 1 tablet by oral route  every day in the evening     tamsulosin 0.4 mg capsule take 1 capsule by oral route  every day     tizanidine 4 mg tablet take 1 tablet by oral route  every day at bedtime       ALLERGIES: Ingredient Reaction Medication Name Comment  NO KNOWN ALLERGIES     No known allergies.    PHYSICAL EXAM:   Vitals Date Temp F BP Pulse Ht In Wt Lb BMI BSA Pain Score  01/17/2018  106/65 90 74 175 22.47  10/10      IMPRESSION:   The patient has severe back and right greater than left lower extremity pain.  He is no longer able to stand without severe  pain and even sitting does not relieve his pain at this point.  The MRI shows a free-fragment disc herniation with associated foraminal stenosis worse at the L3-4 level but also stenosis at L4-5.  Comments:  Will need clearance from Dr. Alroy Dust in [place] to stop Eliquis  PLAN:  I have recommended the patient undergo redo decompression with removal of for herniated disc fragment at the L3-4 level and also decompression with fusion at L4-5.  Risks and benefits were discussed with the patient he wished to proceed with surgery.  He was fitted for LSO brace today.  Orders: Diagnostic Procedures: Assessment Procedure  M48.062 Lumbar Spine- AP/Lat  Miscellaneous: Assessment   M48.062 LSO Brace   Assessment/Plan   # Detail Type Description   1. Assessment Low back pain, unspecified back pain  laterality, with sciatica presence unspecified (M54.5).       2. Assessment Radiculopathy, lumbar region (M54.16).       3. Assessment Herniated nucleus pulposus, lumbar (M51.26).       4. Assessment Lumbar stenosis with neurogenic claudication (M48.062).   Plan Orders LSO Brace.       5. 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                     Provider:  Marchia Meiers. Vertell Limber MD  01/17/2018 05:19 PM Dictation edited by: Marchia Meiers. Vertell Limber    CC Providers: PCP  None   Otho Darner  [address] [place] [address]-               Electronically signed by Marchia Meiers Vertell Limber MD on 01/17/2018 05:19 PM

## 2018-01-22 NOTE — Op Note (Signed)
01/22/2018  12:45 PM  PATIENT:  Kevin Carlson.  84 y.o. male  PRE-OPERATIVE DIAGNOSIS:  Lumbar stenosis with neurogenic claudication, herniated lumbar disc, spondylolisthesis, lumbago, lumbar foraminal stenosis, radiculopathy  POST-OPERATIVE DIAGNOSIS: Lumbar stenosis with neurogenic claudication, herniated lumbar disc, spondylolisthesis, lumbago, lumbar foraminal stenosis, radiculopathy   PROCEDURE:  Procedure(s): Lumbar three-four Lumbar four-five Redo decompression with transforaminal lumbar interbody fusion (N/A) with right L 34 microdiscectomy with TLIF L 34 and L 45 levels with pedicle screw fixation and posterolateral arthrodesis  SURGEON:  Surgeon(s) and Role:    Erline Levine, MD - Primary    * Kary Kos, MD - Assisting  PHYSICIAN ASSISTANT:   ASSISTANTS: Poteat, RN   ANESTHESIA:   general  EBL:  300 mL   BLOOD ADMINISTERED:none  DRAINS: (medium) Hemovact drain(s) in the epidural space with  Suction Open   LOCAL MEDICATIONS USED:  MARCAINE    and LIDOCAINE   SPECIMEN:  No Specimen  DISPOSITION OF SPECIMEN:  N/A  COUNTS:  YES  TOURNIQUET:  * No tourniquets in log *  DICTATION: DICTATION: Patient is 84 year old man with prior decompression at L 34 and at L4/5 with severe recurrent lumbar stenosis and a large disc herniation L 34 right with severe nerve root compression and spinal stenosis from the large amount of herniated disc material. It was elected to take him to surgery for redo decompression and discectomy and fusion at the L 34 and  L 4 / 5 levels.   Procedure: Patient was placed in a prone position on the [place] table after smooth and uncomplicated induction of general endotracheal anesthesia. His low back was prepped and draped in usual sterile fashion with betadine scrub and DuraPrep. Area of incision was infiltrated with local lidocaine. Incision was made to the lumbodorsal fascia was incised and exposure was performed of the L 3 - L 5 transverse  processes and facet joints.Confirmation of level was performed with intraoperative X ray. Redo laminectomy with disarticulation of facet joints on the right at L 34 and L 45 levels.  Under microdissection, the right side of the thecal sac was cleared of scar and a large amount of herniated disc material was removed from below the L 3 nerve root and cephalad and medially migrated free fragments of disc material were removed. Expandable TLIF cages were placed, initially on the right at L 34 (7 x 11 x 26 15 degree anterior 12 mm) after thorough discectomy and preparation of the endplates.  10 cc autograft was tamped into the cage and also into the disc space.   Expandable TLIF cages were then placed on the right at L 45 (7 x 11 x 26 15 degree anterior 12 mm) after thorough discectomy and preparation of the endplates.  10 cc autograft was tamped into the cage and also into the disc space.  After thorough decompression of all neural elements including thecal sac and right L3, L 4,  L5 nerve roots, pedicle screws were placed at L 3, L 4, L 5 levels (6.5 x 50 mm at L 3 and 7.5 x 50 mm at L 4 and L 5 levels).  Intraoperative fluoroscopy confirmed correct orientationin the AP and lateral plane.  Final x-rays demonstrated well-positioned pedicle screw fixation.  70 mm lordotic rods were placed bilaterally and locked down in situ.  Posterolateral region was decorticated and packed with local autograft, allograft (40 ccon left side from L 3 - L 5 levels). A medium Hemovac drain was placed. Long-acting  Marcaine was injected in the soft tissues.  Fascia was closed with 1 Vicryl sutures skin edges were reapproximated 2 and 3-0 Vicryl sutures. The wound is dressed with Dermabond and occlusive dressing.  The patient was extubated in the operating room and taken to recovery in stable satisfactory condition. Counts were correct at the end of the case.   PLAN OF CARE: Admit to inpatient   PATIENT DISPOSITION:  PACU -  hemodynamically stable.   Delay start of Pharmacological VTE agent (>24hrs) due to surgical blood loss or risk of bleeding: yes

## 2018-01-22 NOTE — Anesthesia Postprocedure Evaluation (Signed)
Anesthesia Post Note  Patient: Kevin Carlson.  Procedure(s) Performed: Lumbar three-four Lumbar four-five Redo decompression with transforaminal lumbar interbody fusion (N/A Back)     Patient location during evaluation: PACU Anesthesia Type: General Level of consciousness: awake and alert Pain management: pain level controlled Vital Signs Assessment: post-procedure vital signs reviewed and stable Respiratory status: spontaneous breathing, nonlabored ventilation, respiratory function stable and patient connected to nasal cannula oxygen Cardiovascular status: blood pressure returned to baseline and stable Postop Assessment: no apparent nausea or vomiting Anesthetic complications: no    Last Vitals:  Vitals:   01/22/18 1345 01/22/18 1417  BP: 140/65 140/67  Pulse: 82 89  Resp: 14 16  Temp:  (!) 36.4 C  SpO2: 96% 97%    Last Pain:  Vitals:   01/22/18 1417  TempSrc: Oral  PainSc:                  Barnet Glasgow

## 2018-01-22 NOTE — Brief Op Note (Signed)
01/22/2018  12:45 PM  PATIENT:  Kevin Carlson.  84 y.o. male  PRE-OPERATIVE DIAGNOSIS:  Lumbar stenosis with neurogenic claudication, herniated lumbar disc, spondylolisthesis, lumbago, lumbar foraminal stenosis, radiculopathy  POST-OPERATIVE DIAGNOSIS: Lumbar stenosis with neurogenic claudication, herniated lumbar disc, spondylolisthesis, lumbago, lumbar foraminal stenosis, radiculopathy   PROCEDURE:  Procedure(s): Lumbar three-four Lumbar four-five Redo decompression with transforaminal lumbar interbody fusion (N/A) with right L 34 microdiscectomy with TLIF L 34 and L 45 levels with pedicle screw fixation and posterolateral arthrodesis  SURGEON:  Surgeon(s) and Role:    Erline Levine, MD - Primary    * Kary Kos, MD - Assisting  PHYSICIAN ASSISTANT:   ASSISTANTS: Poteat, RN   ANESTHESIA:   general  EBL:  300 mL   BLOOD ADMINISTERED:none  DRAINS: (medium) Hemovact drain(s) in the epidural space with  Suction Open   LOCAL MEDICATIONS USED:  MARCAINE    and LIDOCAINE   SPECIMEN:  No Specimen  DISPOSITION OF SPECIMEN:  N/A  COUNTS:  YES  TOURNIQUET:  * No tourniquets in log *  DICTATION: DICTATION: Patient is 84 year old man with prior decompression at L 34 and at L4/5 with severe recurrent lumbar stenosis and a large disc herniation L 34 right with severe nerve root compression and spinal stenosis from the large amount of herniated disc material. It was elected to take him to surgery for redo decompression and discectomy and fusion at the L 34 and  L 4 / 5 levels.   Procedure: Patient was placed in a prone position on the [place] table after smooth and uncomplicated induction of general endotracheal anesthesia. His low back was prepped and draped in usual sterile fashion with betadine scrub and DuraPrep. Area of incision was infiltrated with local lidocaine. Incision was made to the lumbodorsal fascia was incised and exposure was performed of the L 3 - L 5 transverse  processes and facet joints.Confirmation of level was performed with intraoperative X ray. Redo laminectomy with disarticulation of facet joints on the right at L 34 and L 45 levels.  Under microdissection, the right side of the thecal sac was cleared of scar and a large amount of herniated disc material was removed from below the L 3 nerve root and cephalad and medially migrated free fragments of disc material were removed. Expandable TLIF cages were placed, initially on the right at L 34 (7 x 11 x 26 15 degree anterior 12 mm) after thorough discectomy and preparation of the endplates.  10 cc autograft was tamped into the cage and also into the disc space.   Expandable TLIF cages were then placed on the right at L 45 (7 x 11 x 26 15 degree anterior 12 mm) after thorough discectomy and preparation of the endplates.  10 cc autograft was tamped into the cage and also into the disc space.  After thorough decompression of all neural elements including thecal sac and right L3, L 4,  L5 nerve roots, pedicle screws were placed at L 3, L 4, L 5 levels (6.5 x 50 mm at L 3 and 7.5 x 50 mm at L 4 and L 5 levels).  Intraoperative fluoroscopy confirmed correct orientationin the AP and lateral plane.  Final x-rays demonstrated well-positioned pedicle screw fixation.  70 mm lordotic rods were placed bilaterally and locked down in situ.  Posterolateral region was decorticated and packed with local autograft, allograft (40 ccon left side from L 3 - L 5 levels). A medium Hemovac drain was placed. Long-acting  Marcaine was injected in the soft tissues.  Fascia was closed with 1 Vicryl sutures skin edges were reapproximated 2 and 3-0 Vicryl sutures. The wound is dressed with Dermabond and occlusive dressing.  The patient was extubated in the operating room and taken to recovery in stable satisfactory condition. Counts were correct at the end of the case.   PLAN OF CARE: Admit to inpatient   PATIENT DISPOSITION:  PACU -  hemodynamically stable.   Delay start of Pharmacological VTE agent (>24hrs) due to surgical blood loss or risk of bleeding: yes

## 2018-01-23 MED FILL — Thrombin (Recombinant) For Soln 20000 Unit: CUTANEOUS | Qty: 1 | Status: AC

## 2018-01-23 NOTE — Progress Notes (Signed)
Physical Therapy Evaluation Patient Details Name: Kevin Carlson. MRN: [id number] DOB: 1930/03/14 Today's Date: 01/23/2018   History of Present Illness  Pt is a 84 y.o. male s/p PLIF @ L3-4 & L4-5.  Clinical Impression  Patient is s/p above surgery resulting in deficits listed below (see PT Problem List). At time of evaluation pt performed ambulation with min guard and RW for safety. Pt educated on positioning, car transfers, stair negotiation, and generalized walking program with an emphasis on descending/ascending into basement to shower when family member/friend present during the day. Pt reported significant pain relief in RLE s/p surgery, but still demonstrated obvious deficits. Recommending pt utilize RW at d/c for ambulation and [facility] for stair negotiation to maximize safety. Acutely, patient will benefit from skilled PT to increase their independence and safety with mobility to allow discharge to the venue listed below.       Follow Up Recommendations No PT follow up;Supervision for mobility/OOB    Equipment Recommendations  None recommended by PT    Recommendations for Other Services       Precautions / Restrictions Precautions Precautions: Back Precaution Booklet Issued: Yes (comment) Precaution Comments: Reviewed in full with pt and family members. Recalled 3/3 precautions with cues.  Required Braces or Orthoses: Spinal Brace Spinal Brace: Applied in sitting position;Thoracolumbosacral orthotic Restrictions Weight Bearing Restrictions: No      Mobility  Bed Mobility Overal bed mobility: Needs Assistance Bed Mobility: Rolling;Sidelying to Sit Rolling: Supervision Sidelying to sit: Supervision       General bed mobility comments: Pt required cues for log roll sequencing. Increased time and effort noted.  Transfers Overall transfer level: Needs assistance Equipment used: Rolling walker (2 wheeled) Transfers: Sit to/from Stand Sit to Stand: Min  guard;Supervision         General transfer comment: Min G for inital sit>stand secondary to pain progressing to supervision for safety. Min cues for hand placement.   Ambulation/Gait Ambulation/Gait assistance: Min guard Gait Distance (Feet): 300 Feet Assistive device: Rolling walker (2 wheeled) Gait Pattern/deviations: Step-through pattern;Trunk flexed Gait velocity: decreased Gait velocity interpretation: <1.31 ft/sec, indicative of household ambulator General Gait Details: VCs for upright posture and proximity to RW. Pt slow and guarded throughout secondary to pain at incision. Pt reports he was not ambulating much prior to surgery due to severe nerve pain and numbness in RLE. Reports almost complete pain relief down RLE at time of session.   Stairs Stairs: Yes Stairs assistance: Min guard Stair Management: One rail Right;One rail Left;Step to pattern;Forwards;With cane Number of Stairs: 10 General stair comments: Pt ascended forwards with rail on the right and SPC on the left. Pt descended forwards with rail on the left and SPC on the right. Pt required VCs for sequencing with SPC and maintenance of spinal precautions.  Wheelchair Mobility    Modified Rankin (Stroke Patients Only)       Balance Overall balance assessment: Needs assistance Sitting-balance support: Feet supported;No upper extremity supported Sitting balance-Leahy Scale: Good     Standing balance support: Bilateral upper extremity supported;During functional activity Standing balance-Leahy Scale: Poor Standing balance comment: Reliant on Bil UE support                              Pertinent Vitals/Pain Pain Assessment: Faces Faces Pain Scale: Hurts little more Pain Location: Back at incision Pain Descriptors / Indicators: Aching;Discomfort;Grimacing;Guarding Pain Intervention(s): Limited activity within patient's tolerance;Monitored during session;Repositioned  Home Living  Family/patient expects to be discharged to:: Private residence Living Arrangements: Alone Available Help at Discharge: Family;Friend(s)(Available from morning to late afternoon) Type of Home: House Home Access: Stairs to enter Entrance Stairs-Rails: Right Entrance Stairs-Number of Steps: 3 Home Layout: Two level Home Equipment: [place] - 2 wheels;Cane - single point      Prior Function Level of Independence: Independent with assistive device(s)         Comments: SPC     Hand Dominance   Dominant Hand: Right    Extremity/Trunk Assessment   Upper Extremity Assessment Upper Extremity Assessment: Overall WFL for tasks assessed    Lower Extremity Assessment Lower Extremity Assessment: Defer to PT evaluation    Cervical / Trunk Assessment Cervical / Trunk Assessment: Other exceptions Cervical / Trunk Exceptions: s/p lumbar surgery  Communication   Communication: No difficulties  Cognition Arousal/Alertness: Awake/alert Behavior During Therapy: WFL for tasks assessed/performed Overall Cognitive Status: Within Functional Limits for tasks assessed                                        General Comments General comments (skin integrity, edema, etc.): Drain intact     Exercises     Assessment/Plan    PT Assessment Patient needs continued PT services  PT Problem List Decreased strength;Decreased range of motion;Decreased activity tolerance;Decreased balance;Decreased mobility;Decreased coordination;Decreased knowledge of use of DME;Decreased safety awareness;Decreased knowledge of precautions;Pain       PT Treatment Interventions DME instruction;Gait training;Stair training;Functional mobility training;Therapeutic activities;Therapeutic exercise;Balance training;Patient/family education;Modalities;Manual techniques    PT Goals (Current goals can be found in the Care Plan section)  Acute Rehab PT Goals Patient Stated Goal: to go home to his dog PT Goal  Formulation: With patient Time For Goal Achievement: 01/30/18 Potential to Achieve Goals: Good    Frequency Min 5X/week   Barriers to discharge        Co-evaluation               AM-PAC PT "6 Clicks" Daily Activity  Outcome Measure Difficulty turning over in bed (including adjusting bedclothes, sheets and blankets)?: A Little Difficulty moving from lying on back to sitting on the side of the bed? : A Little Difficulty sitting down on and standing up from a chair with arms (e.g., wheelchair, bedside commode, etc,.)?: A Little Help needed moving to and from a bed to chair (including a wheelchair)?: A Little Help needed walking in hospital room?: A Little Help needed climbing 3-5 steps with a railing? : A Little 6 Click Score: 18    End of Session Equipment Utilized During Treatment: Gait belt;Back brace Activity Tolerance: Patient tolerated treatment well Patient left: in chair;with call bell/phone within reach;with family/visitor present Nurse Communication: Mobility status PT Visit Diagnosis: Other abnormalities of gait and mobility (R26.89);Pain;Difficulty in walking, not elsewhere classified (R26.2) Pain - part of body: (back at incision)    Time: 4854-6270 PT Time Calculation (min) (ACUTE ONLY): 25 min   Charges:   PT Evaluation $PT Eval Moderate Complexity: 1 Mod PT Treatments $Gait Training: 8-22 mins        Einar Crow, [place]  Student Physical Therapist Acute Rehab [phone]   Einar Crow 01/23/2018, 2:35 PM

## 2018-01-23 NOTE — Evaluation (Signed)
Occupational Therapy Evaluation Patient Details Name: Kevin Carlson. MRN: [id number] DOB: [birth year] Today's Date: 01/23/2018    History of Present Illness Pt is a 84 y.o. male s/p PLIF @ L3-4 & L4-5.  PMH: BPH, arthritis, anemia, carpal tunnel, Afib, HTN, nasal congestion, neuropathy    Clinical Impression   Patient is s/p PLIF L3-4 L4-5 surgery resulting in functional limitations due to the deficits listed below (see OT problem list). Pt will have family (A) upon d/c and recommending family care for dog at this time. Pt reluctantly agreeing to more (A). Pt insisting on transfer to basement for showering and not using main level tub shower. Pt educated on the need to have a RW upstairs and downstairs for transfers. Girlfriend expressed "I think I have one we can use" pt has a rollator at home and advised NOT to use this at this time due to need to have straight posture and avoid flexion.  Patient will benefit from skilled OT acutely to increase independence and safety with ADLS to allow discharge home.     Follow Up Recommendations  No OT follow up    Equipment Recommendations  Other (comment)(RW)    Recommendations for Other Services       Precautions / Restrictions Precautions Precautions: Back Precaution Booklet Issued: Yes (comment) Precaution Comments: Reviewed in full with pt and family members. Recalled 3/3 precautions with cues.  Required Braces or Orthoses: Spinal Brace Spinal Brace: Applied in sitting position;Thoracolumbosacral orthotic Restrictions Weight Bearing Restrictions: No      Mobility Bed Mobility Overal bed mobility: Needs Assistance Bed Mobility: Rolling;Sidelying to Sit Rolling: Supervision Sidelying to sit: Supervision       General bed mobility comments: pt requires cues for bed mobility and educated on sequenec  Transfers Overall transfer level: Needs assistance Equipment used: Rolling walker (2 wheeled) Transfers: Sit to/from  Stand Sit to Stand: Supervision         General transfer comment: pt educated on hand placement and positioning of seating at home in recliner.     Balance                                           ADL either performed or assessed with clinical judgement   ADL Overall ADL's : Needs assistance/impaired Eating/Feeding: Independent   Grooming: Wash/dry face;Oral care;Supervision/safety   Upper Body Bathing: Supervision/ safety   Lower Body Bathing: Supervison/ safety Lower Body Bathing Details (indicate cue type and reason): pt able to cross bil LE a   Upper Body Dressing Details (indicate cue type and reason): don brace min (A) and educated on placement Lower Body Dressing: Supervision/safety Lower Body Dressing Details (indicate cue type and reason): able to cross bil LE Toilet Transfer: Supervision/safety       Tub/ Shower Transfer: Supervision/safety   Functional mobility during ADLs: Supervision/safety General ADL Comments: pt willl have girlfiend and daughter (A) upon d/c. Family will (A) with 20 yo dog Princess     Vision Baseline Vision/History: Wears glasses Wears Glasses: At all times       Perception     Praxis      Pertinent Vitals/Pain Pain Assessment: Faces Faces Pain Scale: Hurts little more Pain Location: Back at incision Pain Descriptors / Indicators: Aching;Discomfort;Grimacing;Guarding Pain Intervention(s): Limited activity within patient's tolerance;Monitored during session;Repositioned     Hand Dominance Right   Extremity/Trunk Assessment Upper  Extremity Assessment Upper Extremity Assessment: Overall WFL for tasks assessed   Lower Extremity Assessment Lower Extremity Assessment: Defer to PT evaluation   Cervical / Trunk Assessment Cervical / Trunk Assessment: Other exceptions Cervical / Trunk Exceptions: s/p lumbar surgery   Communication Communication Communication: No difficulties   Cognition  Arousal/Alertness: Awake/alert Behavior During Therapy: WFL for tasks assessed/performed Overall Cognitive Status: Within Functional Limits for tasks assessed                                     General Comments  drain intact and dressing dry    Exercises     Shoulder Instructions      Home Living Family/patient expects to be discharged to:: Private residence Living Arrangements: Alone Available Help at Discharge: Family;Friend(s)(Available from morning to late afternoon) Type of Home: House Home Access: Stairs to enter [employer] of Steps: 3 Entrance Stairs-Rails: Right Home Layout: Two level Alternate Level Stairs-Number of Steps: 13 Alternate Level Stairs-Rails: Right Bathroom Shower/Tub: Walk-in shower(in basement )         Home Equipment: Walker - 2 wheels;Cane - single point          Prior Functioning/Environment Level of Independence: Independent with assistive device(s)        Comments: SPC        OT Problem List: Decreased strength;Decreased activity tolerance;Impaired balance (sitting and/or standing);Decreased safety awareness;Decreased knowledge of use of DME or AE;Decreased knowledge of precautions;Pain      OT Treatment/Interventions: Self-care/ADL training;Therapeutic exercise;Energy conservation;DME and/or AE instruction;Neuromuscular education;Therapeutic activities;Patient/family education;Balance training    OT Goals(Current goals can be found in the care plan section) Acute Rehab OT Goals Patient Stated Goal: to go home to his dog OT Goal Formulation: With patient/family Time For Goal Achievement: 01/30/18 Potential to Achieve Goals: Good  OT Frequency: Min 2X/week   Barriers to D/C:            Co-evaluation              AM-PAC PT "6 Clicks" Daily Activity     Outcome Measure Help from another person eating meals?: None Help from another person taking care of personal grooming?: A Little Help from  another person toileting, which includes using toliet, bedpan, or urinal?: A Little Help from another person bathing (including washing, rinsing, drying)?: A Little Help from another person to put on and taking off regular upper body clothing?: A Little Help from another person to put on and taking off regular lower body clothing?: A Little 6 Click Score: 19   End of Session Equipment Utilized During Treatment: Gait belt;Rolling walker;Back brace Nurse Communication: Mobility status;Precautions  Activity Tolerance: Patient tolerated treatment well Patient left: in bed;with call bell/phone within reach;with family/visitor present  OT Visit Diagnosis: Unsteadiness on feet (R26.81);Muscle weakness (generalized) (M62.81)                Time: 4496-7591 OT Time Calculation (min): 37 min Charges:  OT General Charges $OT Visit: 1 Visit OT Evaluation $OT Eval Moderate Complexity: 1 Mod OT Treatments $Self Care/Home Management : 8-22 mins   Jeri Modena, OTR/L  Acute Rehabilitation Services Pager: [phone] Office: [phone] .   Parke Poisson B 01/23/2018, 1:58 PM

## 2018-01-23 NOTE — Plan of Care (Signed)
  Problem: Pain Management: Goal: Pain level will decrease Outcome: Progressing   

## 2018-01-23 NOTE — Progress Notes (Addendum)
Subjective: Patient reports "Oh it's much better than before" when asked about lumbar pain.  Objective: Vital signs in last 24 hours: Temp:  [97.4 F (36.3 C)-98.3 F (36.8 C)] 98.1 F (36.7 C) (09/24 0334) Pulse Rate:  [73-109] 109 (09/24 0334) Resp:  [13-20] 18 (09/24 0334) BP: (100-181)/(48-92) 148/67 (09/24 0334) SpO2:  [96 %-100 %] 97 % (09/24 0334) Weight:  [82.1 kg] 82.1 kg (09/23 0733)  Intake/Output from previous day: 09/23 0701 - 09/24 0700 In: 2981.7 [P.O.:240; I.V.:2291.7; IV Piggyback:450] Out: 680 [Urine:220; Drains:160; Blood:300] Intake/Output this shift: No intake/output data recorded.  Alert, conversant, reporting decreased lumbar pain and no buttock or leg pain. Full strength BLE. Incision flat without erythema or drainage beneath honeycomb and Dermabond. Hemovac patent ~13ml overnight. Ambulated in hallway through the night.  Lab Results: Recent Labs    01/22/18 0727  WBC 16.4*  HGB 11.3*  HCT 35.9*  PLT 300   BMET Recent Labs    01/22/18 0727  NA 141  K 4.0  CL 106  CO2 24  GLUCOSE 99  BUN 13  CREATININE 0.97  CALCIUM 9.0    Studies/Results: Dg Lumbar Spine 2-3 Views  Result Date: 01/22/2018 CLINICAL DATA:  Surgery: L3-L5 TLIF Fluoro time: 1 Minute 9 Seconds Location: [facility] RSTO: Ammie Dalton EXAM: DG C-ARM 61-120 MIN; LUMBAR SPINE - 2-3 VIEW COMPARISON:  MRI on 01/17/2018 FINDINGS: Frontal and LATERAL views are performed with posterior retractor in place. Transpedicular screws have been placed at L5, L4, and L3 with interbody fusion devices at L3-4 and L4-5. IMPRESSION: Posterior fusion L3-L5. Electronically Signed   By: Nolon Nations M.D.   On: 01/22/2018 13:02   Dg C-arm 1-60 Min  Result Date: 01/22/2018 CLINICAL DATA:  Surgery: L3-L5 TLIF Fluoro time: 1 Minute 9 Seconds Location: [place] [facility] RSTO: Ammie Dalton EXAM: DG C-ARM 61-120 MIN; LUMBAR SPINE - 2-3 VIEW COMPARISON:  MRI on 01/17/2018 FINDINGS: Frontal and  LATERAL views are performed with posterior retractor in place. Transpedicular screws have been placed at L5, L4, and L3 with interbody fusion devices at L3-4 and L4-5. IMPRESSION: Posterior fusion L3-L5. Electronically Signed   By: Nolon Nations M.D.   On: 01/22/2018 13:02    Assessment/Plan: Improving  LOS: 1 day  Mobilize in brace today.   Verdis Prime 01/23/2018, 7:13 AM  Patient is dong well.  Mobilize today with PT.  Continue drain today.

## 2018-01-24 MED ORDER — HYDROCODONE-ACETAMINOPHEN 5-325 MG PO TABS: 1.0000 | ORAL_TABLET | ORAL | 0 refills | Status: DC | PRN

## 2018-01-24 MED ORDER — METHOCARBAMOL 500 MG PO TABS: 500.0000 mg | ORAL_TABLET | Freq: Four times a day (QID) | ORAL | 1 refills | Status: DC | PRN

## 2018-01-24 NOTE — Progress Notes (Signed)
Physical Therapy Treatment Patient Details Name: Kevin Carlson. MRN: [id number] DOB: Oct 20, 1929 Today's Date: 01/24/2018    History of Present Illness Pt is a 84 y.o. male s/p PLIF @ L3-4 & L4-5.    PT Comments    Pt progressing well towards goals. Pt performing ambulation with gross min guard to supervision with RW for safety. Pt educated on utilizing RW until Bil LE feel stronger and he is not bearing weight though UE before transition to [facility] for balance. Per pt request, negotiated stairs once more during session and provided education on overall safety at d/c. Will continue to follow while admitted.      Follow Up Recommendations  No PT follow up;Supervision for mobility/OOB     Equipment Recommendations  None recommended by PT    Recommendations for Other Services       Precautions / Restrictions Precautions Precautions: Back Precaution Booklet Issued: Yes (comment) Precaution Comments: Recalled 3/3 precautions without cues. Required Braces or Orthoses: Spinal Brace Spinal Brace: Applied in sitting position;Thoracolumbosacral orthotic(already donned at time of session) Restrictions Weight Bearing Restrictions: No    Mobility  Bed Mobility               General bed mobility comments: pt sitting in chair upon entering room  Transfers Overall transfer level: Needs assistance Equipment used: Rolling walker (2 wheeled) Transfers: Sit to/from Stand Sit to Stand: Supervision         General transfer comment: Supervision for safety. Pt demonstrating safe hand placement. Increased time noted.  Ambulation/Gait Ambulation/Gait assistance: Min guard;Supervision   Assistive device: Rolling walker (2 wheeled) Gait Pattern/deviations: Step-through pattern;Trunk flexed Gait velocity: velocity improved from previous session, but still not to baseline.  Gait velocity interpretation: <1.31 ft/sec, indicative of household ambulator General Gait Details: VCs for  upright posture and proximity to RW with poor carryover. Pt reporting pain similiar to that he was having before surgery on RLE at today's session, but was not limited by it.  Pt demonstrating increased gait velocity at time of session.    Stairs Stairs: Yes Stairs assistance: Min guard Stair Management: One rail Right;One rail Left;Step to pattern;Forwards;With cane   General stair comments: Pt ascended forwards with rail on the right and SPC on the left. Pt descended forwards with rail on the left and SPC on the right. Pt required cues initially for cane sequencing with good carryover. Min cues to decrease bending throughout.    Wheelchair Mobility    Modified Rankin (Stroke Patients Only)       Balance Overall balance assessment: Needs assistance Sitting-balance support: Feet supported;No upper extremity supported Sitting balance-Leahy Scale: Good     Standing balance support: Bilateral upper extremity supported;During functional activity Standing balance-Leahy Scale: Fair Standing balance comment: able to static stand without UE support; use of bil UE supoprt during dynamic tasks                            Cognition Arousal/Alertness: Awake/alert Behavior During Therapy: WFL for tasks assessed/performed Overall Cognitive Status: Within Functional Limits for tasks assessed                                        Exercises      General Comments        Pertinent Vitals/Pain Pain Assessment: Faces Faces Pain Scale: Hurts a  little bit Pain Location: Back at incision Pain Descriptors / Indicators: Discomfort;Grimacing;Guarding;Tender Pain Intervention(s): Limited activity within patient's tolerance;Monitored during session;Repositioned    Home Living                      Prior Function            PT Goals (current goals can now be found in the care plan section) Acute Rehab PT Goals Patient Stated Goal: to go home to his  dog PT Goal Formulation: With patient Time For Goal Achievement: 01/30/18 Potential to Achieve Goals: Good Progress towards PT goals: Progressing toward goals    Frequency    Min 5X/week      PT Plan Current plan remains appropriate    Co-evaluation              AM-PAC PT "6 Clicks" Daily Activity  Outcome Measure  Difficulty turning over in bed (including adjusting bedclothes, sheets and blankets)?: A Little Difficulty moving from lying on back to sitting on the side of the bed? : A Little Difficulty sitting down on and standing up from a chair with arms (e.g., wheelchair, bedside commode, etc,.)?: A Little Help needed moving to and from a bed to chair (including a wheelchair)?: A Little Help needed walking in hospital room?: A Little Help needed climbing 3-5 steps with a railing? : A Little 6 Click Score: 18    End of Session Equipment Utilized During Treatment: Gait belt;Back brace Activity Tolerance: Patient tolerated treatment well Patient left: in chair;with call bell/phone within reach;with family/visitor present Nurse Communication: Mobility status PT Visit Diagnosis: Other abnormalities of gait and mobility (R26.89);Pain;Difficulty in walking, not elsewhere classified (R26.2) Pain - part of body: (back at incision)     Time: 6144-3154 PT Time Calculation (min) (ACUTE ONLY): 14 min  Charges:  $Gait Training: 8-22 mins                     Einar Crow, [place]  Student Physical Therapist Acute Rehab [phone]    Einar Crow 01/24/2018, 10:58 AM

## 2018-01-24 NOTE — Discharge Summary (Signed)
Physician Discharge Summary  Patient ID: Kevin Carlson. MRN: [id number] DOB/AGE: 82-23-1931 82 y.o.  Admit date: 01/22/2018 Discharge date: 01/24/2018  Admission Diagnoses: Lumbar stenosis with neurogenic claudication, herniated lumbar disc, spondylolisthesis, lumbago, lumbar foraminal stenosis, radiculopathy    Discharge Diagnoses: Lumbar stenosis with neurogenic claudication, herniated lumbar disc, spondylolisthesis, lumbago, lumbar foraminal stenosis, radiculopathy s/p Lumbar three-four Lumbar four-five Redo decompression with transforaminal lumbar interbody fusion (N/A) with right L 34 microdiscectomy with TLIF L 34 and L 45 levels with pedicle screw fixation and posterolateral arthrodesis     Active Problems:   Lumbar foraminal stenosis   Discharged Condition: good  Hospital Course: Kevin Carlson was admitted for surgery with dx HNP and lumbar stenosis with neurogenic claudication. Following uncomplicated redo decompression with discectomy and fusion, he recovered nicely and transferred to [facility] for nursing care and therapies. He is mobilizing well.  Consults: None  Significant Diagnostic Studies: radiology: X-Ray: intra-op  Treatments: surgery: Lumbar three-four Lumbar four-five Redo decompression with transforaminal lumbar interbody fusion (N/A) with right L 34 microdiscectomy with TLIF L 34 and L 45 levels with pedicle screw fixation and posterolateral arthrodesis    Discharge Exam: Blood pressure 115/68, pulse 80, temperature 98.2 F (36.8 C), temperature source Oral, resp. rate 19, height 5' 10.98" (1.803 m), weight 82.1 kg, SpO2 98 %. Alert, conversant, reporting lumbar pain and mild tingling right thigh. ncision flat without erythema or drainage. Hemovac with minimal output overnight. Good strength BLE. Reassured re: tingling right thigh (excruciating pain x4wks pre-op) and lumbar soreness. Ambulating with staff.    Disposition: Discharge to  home per DrStern. Rx'sNorco 5/325 and Robaxin 500mg will be eRx'ed from office to pts pharmacy for prn home use.He should continue his Gabapentin 800mg  bid.He has f/u appt scheduled.     Allergies as of 01/24/2018   No Known Allergies     Medication List    STOP taking these medications   ibuprofen 200 MG tablet Commonly known as:  ADVIL,MOTRIN     TAKE these medications   amLODipine-benazepril 5-10 MG capsule Commonly known as:  LOTREL Take 1 capsule by mouth daily.   Camphor-Menthol-Methyl Sal 1.2-5.7-6.3 % Ptch Apply 1 patch topically daily.   ELIQUIS 5 MG Tabs tablet Generic drug:  apixaban Take 5 mg by mouth 2 (two) times daily.   ferrous sulfate 325 (65 FE) MG tablet Take 325 mg by mouth daily with breakfast.   gabapentin 800 MG tablet Commonly known as:  NEURONTIN Take 800 mg by mouth 2 (two) times daily.   HYDROcodone-acetaminophen 10-325 MG tablet Commonly known as:  NORCO Take 1 tablet by mouth daily as needed for moderate pain. What changed:  Another medication with the same name was added. Make sure you understand how and when to take each.   HYDROcodone-acetaminophen 5-325 MG tablet Commonly known as:  NORCO/VICODIN Take 1-2 tablets by mouth every 4 (four) hours as needed for severe pain ((score 7 to 10)). What changed:  You were already taking a medication with the same name, and this prescription was added. Make sure you understand how and when to take each.   lansoprazole 30 MG capsule Commonly known as:  PREVACID Take 30 mg by mouth daily.   leflunomide 20 MG tablet Commonly known as:  ARAVA Take 20 mg by mouth daily.   methocarbamol 500 MG tablet Commonly known as:  ROBAXIN Take 1 tablet (500 mg total) by mouth every 6 (six) hours as needed for muscle spasms.   metoprolol succinate 25  MG 24 hr tablet Commonly known as:  TOPROL-XL Take 25 mg by mouth daily.   predniSONE 5 MG tablet Commonly known as:  DELTASONE Take 5 mg by mouth  daily with breakfast.   simvastatin 40 MG tablet Commonly known as:  ZOCOR Take 40 mg by mouth daily.   tamsulosin 0.4 MG Caps capsule Commonly known as:  FLOMAX Take 0.4 mg by mouth daily.   tiZANidine 4 MG tablet Commonly known as:  ZANAFLEX Take 4 mg by mouth at bedtime.        Signed: [name], MD 01/24/2018, 9:50 AM

## 2018-01-24 NOTE — Progress Notes (Signed)
Pt doing well. Pt and daughter given D/C instructions with Rx's, verbal understanding was provided. Pt's incision is clean and dry with no sign of infection. Pt's IV was removed prior to D/C. Pt D/C'd home via wheelchair @ 1015 per MD order. Pt is stable @ D/C and has no other needs at this time. Holli Humbles, RN

## 2018-01-24 NOTE — Progress Notes (Addendum)
Subjective: Patient reports "Just a little tingling in this leg (right thigh)"  Objective: Vital signs in last 24 hours: Temp:  [98.2 F (36.8 C)-100.3 F (37.9 C)] 98.2 F (36.8 C) (09/25 0733) Pulse Rate:  [72-106] 80 (09/25 0733) Resp:  [18-19] 19 (09/25 0733) BP: (115-157)/(50-81) 115/68 (09/25 0733) SpO2:  [97 %-100 %] 98 % (09/25 0733)  Intake/Output from previous day: 09/24 0701 - 09/25 0700 In: -  Out: 1030 [Urine:850; Drains:180] Intake/Output this shift: No intake/output data recorded.  Alert, conversant, reporting lumbar pain and mild tingling right thigh. ncision flat without erythema or drainage. Hemovac with minimal output overnight. Good strength BLE. Reassured re: tingling right thigh (excruciating pain x4wks pre-op) and lumbar soreness. Ambulating with staff.  Lab Results: Recent Labs    01/22/18 0727  WBC 16.4*  HGB 11.3*  HCT 35.9*  PLT 300   BMET Recent Labs    01/22/18 0727  NA 141  K 4.0  CL 106  CO2 24  GLUCOSE 99  BUN 13  CREATININE 0.97  CALCIUM 9.0    Studies/Results: Dg Lumbar Spine 2-3 Views  Result Date: 01/22/2018 CLINICAL DATA:  Surgery: L3-L5 TLIF Fluoro time: 1 Minute 9 Seconds Location: [place] [facility] RSTO: Ammie Dalton EXAM: DG C-ARM 61-120 MIN; LUMBAR SPINE - 2-3 VIEW COMPARISON:  MRI on 01/17/2018 FINDINGS: Frontal and LATERAL views are performed with posterior retractor in place. Transpedicular screws have been placed at L5, L4, and L3 with interbody fusion devices at L3-4 and L4-5. IMPRESSION: Posterior fusion L3-L5. Electronically Signed   By: Nolon Nations M.D.   On: 01/22/2018 13:02   Dg C-arm 1-60 Min  Result Date: 01/22/2018 CLINICAL DATA:  Surgery: L3-L5 TLIF Fluoro time: 1 Minute 9 Seconds Location: [place] [facility] RSTO: Ammie Dalton EXAM: DG C-ARM 61-120 MIN; LUMBAR SPINE - 2-3 VIEW COMPARISON:  MRI on 01/17/2018 FINDINGS: Frontal and LATERAL views are performed with posterior retractor in place. Transpedicular  screws have been placed at L5, L4, and L3 with interbody fusion devices at L3-4 and L4-5. IMPRESSION: Posterior fusion L3-L5. Electronically Signed   By: Nolon Nations M.D.   On: 01/22/2018 13:02    Assessment/Plan: Improving  LOS: 2 days  Ok to d/c Hemovac and d/c to home per DrStern. Rx's Norco 5/325 and Robaxin 500mg  will be eRx'ed from office to pts pharmacy for prn home use. He should continue his Gabapentin 800mg  bid. He has f/u appt scheduled.   Verdis Prime 01/24/2018, 8:26 AM  Patient is much improved.  Discharge home.

## 2018-01-24 NOTE — Progress Notes (Signed)
Occupational Therapy Treatment Patient Details Name: Kevin Carlson. MRN: [id number] DOB: 1929/08/17 Today's Date: 01/24/2018    History of present illness Pt is a 84 y.o. male s/p PLIF @ L3-4 & L4-5.   OT comments  Pt progressing towards OT goals. Focus of session on UB/LB dressing and functional transfers to walk-in shower. Pt performing dressing ADLs with overall minguard assist, requiring light minA for full completion of LB dressing and with min cues for technique/adherence to back precautions during functional tasks. Pt demonstrating functional mobility and shower transfer using RW with minguard assist, pt demonstrating good recall of transfer techniques. Pt's daughter present during session and reports will return home with pt initially to assist PRN. Education provided and questions answered throughout. Feel POC remains appropriate. Will continue to follow acutely to progress pt towards established OT goals.   Follow Up Recommendations  No OT follow up    Equipment Recommendations  Other (comment)(RW)          Precautions / Restrictions Precautions Precautions: Back Precaution Booklet Issued: Yes (comment) Precaution Comments: Reviewed in full with pt and family members. Recalled 3/3 precautions with cues.  Required Braces or Orthoses: Spinal Brace Spinal Brace: Applied in sitting position;Thoracolumbosacral orthotic Restrictions Weight Bearing Restrictions: No       Mobility Bed Mobility               General bed mobility comments: pt sitting EOB upon entering room  Transfers Overall transfer level: Needs assistance Equipment used: Rolling walker (2 wheeled) Transfers: Sit to/from Stand Sit to Stand: Min guard;Min assist         General transfer comment: overall minguard one instance of MinA when standing from EOB due to pain; VCs hand placement initially with pt demonstrating good carryover after initial education    Balance Overall balance  assessment: Needs assistance Sitting-balance support: Feet supported;No upper extremity supported Sitting balance-Leahy Scale: Good     Standing balance support: Bilateral upper extremity supported;During functional activity Standing balance-Leahy Scale: Fair Standing balance comment: able to static stand without UE support; use of bil UE supoprt during dynamic tasks                           ADL either performed or assessed with clinical judgement   ADL Overall ADL's : Needs assistance/impaired                 Upper Body Dressing : Set up;Supervision/safety Upper Body Dressing Details (indicate cue type and reason): supervision for safe management/donning of spinal brace, pt able to complete without physical assist Lower Body Dressing: Minimal assistance Lower Body Dressing Details (indicate cue type and reason): light minA to fully thread pants over LE and to sequence/problem solve thorugh task; minguard-minA for standing balance         Tub/ Shower Transfer: English as a second language teacher Details (indicate cue type and reason): good recall of shower tranfer technique Functional mobility during ADLs: Supervision/safety;Rolling walker                         Cognition Arousal/Alertness: Awake/alert Behavior During Therapy: WFL for tasks assessed/performed Overall Cognitive Status: Within Functional Limits for tasks assessed  Pertinent Vitals/ Pain       Pain Assessment: Faces Faces Pain Scale: Hurts little more Pain Location: Back at incision Pain Descriptors / Indicators: Aching;Discomfort;Grimacing;Guarding Pain Intervention(s): Limited activity within patient's tolerance;Monitored during session  Frequency  Min 2X/week        Progress Toward Goals  OT Goals(current goals can now be found in the care plan section)  Progress towards OT goals:  Progressing toward goals  Acute Rehab OT Goals Patient Stated Goal: to go home to his dog OT Goal Formulation: With patient/family Time For Goal Achievement: 01/30/18 Potential to Achieve Goals: Good  Plan Discharge plan remains appropriate    Co-evaluation                 AM-PAC PT "6 Clicks" Daily Activity     Outcome Measure   Help from another person eating meals?: None Help from another person taking care of personal grooming?: A Little Help from another person toileting, which includes using toliet, bedpan, or urinal?: A Little Help from another person bathing (including washing, rinsing, drying)?: A Little Help from another person to put on and taking off regular upper body clothing?: A Little Help from another person to put on and taking off regular lower body clothing?: A Little 6 Click Score: 19    End of Session Equipment Utilized During Treatment: Gait belt;Rolling walker;Back brace  OT Visit Diagnosis: Unsteadiness on feet (R26.81);Muscle weakness (generalized) (M62.81)   Activity Tolerance Patient tolerated treatment well   Patient Left with call bell/phone within reach;with family/visitor present;in chair   Nurse Communication Mobility status;Precautions        Time: 2774-1287 OT Time Calculation (min): 22 min  Charges: OT General Charges $OT Visit: 1 Visit OT Treatments $Self Care/Home Management : 8-22 mins  Lou Cal, OT [facility] Pager [phone] Office [phone]    Kevin Carlson 01/24/2018, 9:12 AM

## 2018-06-20 ENCOUNTER — Ambulatory Visit: Payer: Medicare Other | Admitting: "Endocrinology

## 2018-06-21 LAB — T3, FREE: T3 FREE: 2.9 pg/mL (ref 2.3–4.2)

## 2018-06-21 LAB — TSH: TSH: 0.24 mIU/L — ABNORMAL LOW (ref 0.40–4.50)

## 2018-06-21 LAB — T4, FREE: Free T4: 0.9 ng/dL (ref 0.8–1.8)

## 2018-07-04 ENCOUNTER — Ambulatory Visit (INDEPENDENT_AMBULATORY_CARE_PROVIDER_SITE_OTHER): Payer: Medicare Other | Admitting: "Endocrinology

## 2018-07-04 ENCOUNTER — Encounter: Payer: Self-pay | Admitting: "Endocrinology

## 2018-07-04 VITALS — BP 131/66 | HR 58 | Ht 71.0 in | Wt 181.8 lb

## 2018-07-04 DIAGNOSIS — E0590 Thyrotoxicosis, unspecified without thyrotoxic crisis or storm: Secondary | ICD-10-CM | POA: Diagnosis not present

## 2018-07-04 NOTE — Progress Notes (Signed)
Kevin Carlson, CMA  

## 2018-07-04 NOTE — Progress Notes (Signed)
Endocrinology follow up Note                                            07/04/2018, 1:21 PM   Subjective:    Patient ID: Kevin Holms., male    DOB: Sep 14, 1929, PCP Kennieth Rad, MD   Past Medical History:  Diagnosis Date  . Anemia   . Arthritis   . BPH (benign prostatic hyperplasia)   . Cancer ([place])    Skin Cancer-   . Carpal tunnel syndrome   . Complication of anesthesia 2011   " bleeding from Esophagus after extubated, had to have an endo  . Constipation due to opioid therapy   . DDD (degenerative disc disease), cervical   . Dysrhythmia    afib  . GERD (gastroesophageal reflux disease)   . History of hiatal hernia   . History of kidney stones    passed  . Hyperlipemia   . Hypertension   . Nasal congestion   . Neuropathy   . Osteopenia   . Pneumonia    approx 2015  . Polymyalgia rheumatica ([facility])    Past Surgical History:  Procedure Laterality Date  . APPENDECTOMY  1951  . BIOPSY  11/18/2016   Procedure: BIOPSY;  Surgeon: Rogene Houston, MD;  [facility];  Service: Endoscopy;;  esophagus  . COLON RESECTION  1951   after appendectomy  . COLONOSCOPY N/A 11/18/2016   Procedure: COLONOSCOPY;  Surgeon: Rogene Houston, MD;  [facility];  Service: Endoscopy;  Laterality: N/A;  . COLONOSCOPY W/ POLYPECTOMY    . ESOPHAGOGASTRODUODENOSCOPY N/A 11/18/2016   Procedure: ESOPHAGOGASTRODUODENOSCOPY (EGD);  Surgeon: Rogene Houston, MD;  [facility];  Service: Endoscopy;  Laterality: N/A;  12:10  . EYE SURGERY Bilateral    cataract  . FINGER SURGERY Right    index- pinned  . HERNIA REPAIR Right   . HERNIA REPAIR Right 1948  . KNEE SURGERY Right 1974   "cleaned it out"  . LUMBAR LAMINECTOMY/DECOMPRESSION MICRODISCECTOMY N/A 03/20/2015   Procedure: Decompressive lumbar laminectomy L3-L5 ;  Surgeon: Erline Levine, MD;  Location: [facility];  Service: Neurosurgery;  Laterality: N/A;  . POLYPECTOMY  11/18/2016   Procedure:  POLYPECTOMY;  Surgeon: Rogene Houston, MD;  [facility];  Service: Endoscopy;;  colon   . REPLACEMENT TOTAL KNEE Left 2011  . REPLACEMENT TOTAL KNEE Right 2006  . TONSILLECTOMY     Social History   Socioeconomic History  . Marital status: Widowed    Spouse name: Not on file  . Number of children: Not on file  . Years of education: Not on file  . Highest education level: Not on file  Occupational History  . Not on file  Social Needs  . Financial resource strain: Not on file  . Food insecurity:    Worry: Not on file    Inability: Not on file  . Transportation needs:    Medical: Not on file    Non-medical: Not on file  Tobacco Use  . Smoking status: Former Smoker    Years: 25.00    Last attempt to quit: 05/02/1974    Years since quitting: 44.2  . Smokeless tobacco: Never Used  . Tobacco comment: quit smoking in 1976  Substance and Sexual Activity  . Alcohol use: No  .  Drug use: No  . Sexual activity: Not on file  Lifestyle  . Physical activity:    Days per week: Not on file    Minutes per session: Not on file  . Stress: Not on file  Relationships  . Social connections:    Talks on phone: Not on file    Gets together: Not on file    Attends religious service: Not on file    Active member of club or organization: Not on file    Attends meetings of clubs or organizations: Not on file    Relationship status: Not on file  Other Topics Concern  . Not on file  Social History Narrative  . Not on file   Outpatient Encounter Medications as of 07/04/2018  Medication Sig  . amLODipine-benazepril (LOTREL) 5-10 MG capsule Take 1 capsule by mouth daily.  Marland Kitchen apixaban (ELIQUIS) 5 MG TABS tablet Take 5 mg by mouth 2 (two) times daily.  Marland Kitchen gabapentin (NEURONTIN) 800 MG tablet Take 800 mg by mouth 2 (two) times daily.  Marland Kitchen HYDROcodone-acetaminophen (NORCO) 10-325 MG tablet Take 1 tablet by mouth daily as needed for moderate pain.   Marland Kitchen HYDROcodone-acetaminophen (NORCO/VICODIN)  5-325 MG tablet Take 1-2 tablets by mouth every 4 (four) hours as needed for severe pain ((score 7 to 10)).  Marland Kitchen lansoprazole (PREVACID) 30 MG capsule Take 30 mg by mouth daily.   Marland Kitchen leflunomide (ARAVA) 20 MG tablet Take 20 mg by mouth daily.  . metoprolol succinate (TOPROL-XL) 25 MG 24 hr tablet Take 25 mg by mouth daily.  . predniSONE (DELTASONE) 5 MG tablet Take 5 mg by mouth daily with breakfast.  . simvastatin (ZOCOR) 40 MG tablet Take 40 mg by mouth daily.  . tamsulosin (FLOMAX) 0.4 MG CAPS capsule Take 0.4 mg by mouth daily.   Marland Kitchen tiZANidine (ZANAFLEX) 4 MG tablet Take 4 mg by mouth at bedtime.   . [DISCONTINUED] Camphor-Menthol-Methyl Sal 1.2-5.7-6.3 % PTCH Apply 1 patch topically daily. (Patient not taking: Reported on 01/17/2018)  . [DISCONTINUED] ferrous sulfate 325 (65 FE) MG tablet Take 325 mg by mouth daily with breakfast.  . [DISCONTINUED] methocarbamol (ROBAXIN) 500 MG tablet Take 1 tablet (500 mg total) by mouth every 6 (six) hours as needed for muscle spasms. (Patient not taking: Reported on 07/04/2018)   No facility-administered encounter medications on file as of 07/04/2018.    ALLERGIES: No Known Allergies  VACCINATION STATUS:  There is no immunization history on file for this patient.  HPI Kevin Carlson is 85 y.o. male who presents today with a medical history as above. he is returning with repeat thyroid function tests after he was seen in consultation for mild subclinical hyperthyroidism.   He is currently not on any antithyroid medications.     He denies any prior history of thyroid dysfunction.  He denies any new complaints today.  He is on metoprolol 25 mg p.o. daily.  He denies any palpitations, tremors, heat intolerance.  He has a stable weight since last visit.   He is on multiple medications related to his history of hypertension, hypercholesterolemia,.  -He denies dysphagia, shortness of breath, change in voice.  He denies any family history of thyroid  dysfunction nor thyroid cancer.  Review of Systems  Constitutional: + Steady weight, no fatigue, no subjective hyper or hypothermia.   Eyes: no blurry vision, no xerophthalmia ENT: no sore throat, no nodules palpated in throat, no dysphagia/odynophagia, no hoarseness Musculoskeletal: no muscle/joint aches Skin: no rashes Neurological: no  tremors, no numbness, no tingling, no dizziness Psychiatric: no depression, no anxiety  Objective:    BP 131/66   Pulse (!) 58   Ht 5\' 11"  (1.803 m)   Wt 181 lb 12.8 oz (82.5 kg)   BMI 25.36 kg/m   Wt Readings from Last 3 Encounters:  07/04/18 181 lb 12.8 oz (82.5 kg)  01/22/18 181 lb (82.1 kg)  01/06/18 181 lb (82.1 kg)    Physical Exam  Constitutional:  + Appropriate weight for height, not in acute distress, normal state of mind Eyes: PERRLA, EOMI, no exophthalmos ENT: moist mucous membranes, no gross thyromegaly,  no cervical lymphadenopathy  Musculoskeletal: no gross deformities, strength intact in all four extremities Skin: moist, warm, no rashes Neurological: no tremor with outstretched hands, Deep tendon reflexes normal in all four extremities.  CMP ( most recent) CMP     Component Value Date/Time   NA 141 01/22/2018 0727   K 4.0 01/22/2018 0727   CL 106 01/22/2018 0727   CO2 24 01/22/2018 0727   GLUCOSE 99 01/22/2018 0727   BUN 13 01/22/2018 0727   CREATININE 0.97 01/22/2018 0727   CALCIUM 9.0 01/22/2018 0727   PROT 5.1 (L) 11/28/2016 0737   ALBUMIN 3.3 (L) 11/28/2016 0737   AST 16 11/28/2016 0737   ALT 10 (L) 11/28/2016 0737   ALKPHOS 35 (L) 11/28/2016 0737   BILITOT 0.8 11/28/2016 0737   GFRNONAA >60 01/22/2018 0727   GFRAA >60 01/22/2018 0727   Sep 06, 2017 labs: TSH 0.18, total T4 4.7  Results for Kevin Carlson, Kevin Carlson (MRN [id number]) as of 07/04/2018 11:15  Ref. Range 09/06/2017 00:00 11/27/2017 10:44 06/20/2018 11:19  TSH Latest Ref Range: 0.40 - 4.50 mIU/L 0.18 (A) 0.18 (L) 0.24 (L)  Triiodothyronine,Free,Serum  Latest Ref Range: 2.3 - 4.2 pg/mL  2.5 2.9  T4,Free(Direct) Latest Ref Range: 0.8 - 1.8 ng/dL  1.1 0.9    Assessment & Plan:   1. Subclinical hyperthyroidism -His repeat labs are still suggestive of mild, however improving subclinical hyperthyroidism.  Patient has no specific signs or symptoms of hyperthyroidism.  He will not need definitive antithyroid treatment at this time.   -He agrees with plan to repeat thyroid function test in 6 months with office visit.    - I did not initiate any new prescriptions today. - I advised him  to maintain close follow up with Kennieth Rad, MD for primary care needs.   Kevin Holms. participated in the discussions, expressed understanding, and voiced agreement with the above plans.  All questions were answered to his satisfaction. he is encouraged to contact clinic should he have any questions or concerns prior to his return visit.  Follow up plan: Return in about 6 months (around 01/04/2019) for Follow up with Pre-visit Labs.   Glade Lloyd, MD [facility] [address] [place], [place] [address] Phone: [phone]  Fax: [phone]     07/04/2018, 1:21 PM  This note was partially dictated with voice recognition software. Similar sounding words can be transcribed inadequately or may not  be corrected upon review.

## 2018-09-27 ENCOUNTER — Other Ambulatory Visit: Payer: Self-pay | Admitting: Neurosurgery

## 2018-10-01 NOTE — Progress Notes (Signed)
[place], [place] [address] Phone: [phone] Fax: [phone]      Your procedure is scheduled on June 4  Report to [facility] Main Entrance "A" at 1120 A.M., and check in at the Admitting office.  Call this number if you have problems the morning of surgery:  [phone]  Call [phone] if you have any questions prior to your surgery date Monday-Friday 8am-4pm    Remember:  Do not eat or drink after midnight.    Take these medicines the morning of surgery with A SIP OF WATER  acetaminophen (TYLENOL)  If needed clindamycin (CLEOCIN) gabapentin (NEURONTIN)  HYDROcodone-acetaminophen (NORCO)  If needed lansoprazole (PREVACID) metoprolol succinate (TOPROL-XL)  predniSONE (DELTASONE)  simvastatin (ZOCOR) tamsulosin (FLOMAX)  Follow your surgeon's instructions on when to stop Eliquis.  If no instructions were given by your surgeon then you will need to call the office to get those instructions.     7 days prior to surgery STOP taking any Aspirin (unless otherwise instructed by your surgeon), Aleve, Naproxen, Ibuprofen, Motrin, Advil, Goody's, BC's, all herbal medications, fish oil, and all vitamins.     The Morning of Surgery  Do not wear jewelry, make-up or nail polish.  Do not wear lotions, powders, or perfumes/colognes, or deodorant  Do not shave 48 hours prior to surgery.  Men may shave face and neck.  Do not bring valuables to the hospital.  [facility] is not responsible for any belongings or valuables.  If you are a smoker, DO NOT Smoke 24 hours prior to surgery IF you wear a CPAP at night please bring your mask, tubing, and machine the morning of surgery   Remember that you must have someone to transport you home after your surgery, and remain with you for 24 hours if you are discharged the same day.   Contacts, glasses, hearing aids, dentures or bridgework may not be worn into  surgery.    Leave your suitcase in the car.  After surgery it may be brought to your room.  For patients admitted to the hospital, discharge time will be determined by your treatment team.  Patients discharged the day of surgery will not be allowed to drive home.    Special instructions:   [place]- Preparing For Surgery  Before surgery, you can play an important role. Because skin is not sterile, your skin needs to be as free of germs as possible. You can reduce the number of germs on your skin by washing with CHG (chlorahexidine gluconate) Soap before surgery.  CHG is an antiseptic cleaner which kills germs and bonds with the skin to continue killing germs even after washing.    Oral Hygiene is also important to reduce your risk of infection.  Remember - BRUSH YOUR TEETH THE MORNING OF SURGERY WITH YOUR REGULAR TOOTHPASTE  Please do not use if you have an allergy to CHG or antibacterial soaps. If your skin becomes reddened/irritated stop using the CHG.  Do not shave (including legs and underarms) for at least 48 hours prior to first CHG shower. It is OK to shave your face.  Please follow these instructions carefully.   1. Shower the NIGHT BEFORE SURGERY and the MORNING OF SURGERY with CHG Soap.   2. If you chose to wash your hair, wash your hair first as usual with your normal shampoo.  3. After you shampoo, rinse your hair and body thoroughly to remove the  shampoo.  4. Use CHG as you would any other liquid soap. You can apply CHG directly to the skin and wash gently with a scrungie or a clean washcloth.   5. Apply the CHG Soap to your body ONLY FROM THE NECK DOWN.  Do not use on open wounds or open sores. Avoid contact with your eyes, ears, mouth and genitals (private parts). Wash Face and genitals (private parts)  with your normal soap.   6. Wash thoroughly, paying special attention to the area where your surgery will be performed.  7. Thoroughly rinse your body with warm  water from the neck down.  8. DO NOT shower/wash with your normal soap after using and rinsing off the CHG Soap.  9. Pat yourself dry with a CLEAN TOWEL.  10. Wear CLEAN PAJAMAS to bed the night before surgery, wear comfortable clothes the morning of surgery  11. Place CLEAN SHEETS on your bed the night of your first shower and DO NOT SLEEP WITH PETS.    Day of Surgery:  Do not apply any deodorants/lotions.  Please wear clean clothes to the hospital/surgery center.   Remember to brush your teeth WITH YOUR REGULAR TOOTHPASTE.   Please read over the following fact sheets that you were given.

## 2018-10-02 ENCOUNTER — Encounter (HOSPITAL_COMMUNITY)
Admission: RE | Admit: 2018-10-02 | Discharge: 2018-10-02 | Disposition: A | Discharge status: Home / Self Care | Payer: Medicare Other | Source: Ambulatory Visit | Attending: Neurosurgery | Admitting: Neurosurgery

## 2018-10-02 ENCOUNTER — Other Ambulatory Visit (HOSPITAL_COMMUNITY)
Admission: RE | Admit: 2018-10-02 | Discharge: 2018-10-02 | Disposition: A | Discharge status: Home / Self Care | Payer: Medicare Other | Source: Ambulatory Visit | Attending: Neurosurgery | Admitting: Neurosurgery

## 2018-10-02 ENCOUNTER — Encounter (HOSPITAL_COMMUNITY): Payer: Self-pay

## 2018-10-02 ENCOUNTER — Other Ambulatory Visit: Payer: Self-pay

## 2018-10-02 LAB — BASIC METABOLIC PANEL
Anion gap: 8 (ref 5–15)
BUN: 16 mg/dL (ref 8–23)
CO2: 22 mmol/L (ref 22–32)
Calcium: 8.9 mg/dL (ref 8.9–10.3)
Chloride: 110 mmol/L (ref 98–111)
Creatinine, Ser: 1.17 mg/dL (ref 0.61–1.24)
GFR calc Af Amer: >60 mL/min (ref >60)
GFR calc non Af Amer: 56 mL/min — ABNORMAL LOW (ref >60)
Glucose, Bld: 98 mg/dL (ref 70–99)
Potassium: 4.4 mmol/L (ref 3.5–5.1)
Sodium: 140 mmol/L (ref 135–145)

## 2018-10-02 LAB — CBC
HCT: 32.2 % — ABNORMAL LOW (ref 39.0–52.0)
Hemoglobin: 10.3 g/dL — ABNORMAL LOW (ref 13.0–17.0)
MCH: 32.9 pg (ref 26.0–34.0)
MCHC: 32 g/dL (ref 30.0–36.0)
MCV: 102.9 fL — ABNORMAL HIGH (ref 80.0–100.0)
Platelets: 207 10*3/uL (ref 150–400)
RBC: 3.13 MIL/uL — ABNORMAL LOW (ref 4.22–5.81)
RDW: 18.7 % — ABNORMAL HIGH (ref 11.5–15.5)
WBC: 10.4 10*3/uL (ref 4.0–10.5)
nRBC: 0 % (ref 0.0–0.2)

## 2018-10-02 LAB — TYPE AND SCREEN
ABO/RH(D): A POS
Antibody Screen: NEGATIVE

## 2018-10-02 LAB — SURGICAL PCR SCREEN
MRSA, PCR: POSITIVE — AB
Staphylococcus aureus: POSITIVE — AB

## 2018-10-02 NOTE — Progress Notes (Signed)
PCP - Desma Mcgregor Cardiologist - Alroy Dust in [place] @ [facility]  Chest x-ray - na EKG - past month Stress Test -  Several yrs.  ECHO - several yrs.  Cardiac Cath - several yrs.  Sleep Study - na CPAP -   Fasting Blood Sugar - na Checks Blood Sugar _____ times a day  Blood Thinner Instructions: stop eliquis 48 hours prior to surgery. Pt. Stated he will not take the pm dose tonight and hold til surgery per cardiologist.  Aspirin Instructions:  Anesthesia review:   Patient denies shortness of breath, fever, cough and chest pain at PAT appointment   Patient verbalized understanding of instructions that were given to them at the PAT appointment. Patient was also instructed that they will need to review over the PAT instructions again at home before surgery.

## 2018-10-03 LAB — NOVEL CORONAVIRUS, NAA (HOSP ORDER, SEND-OUT TO REF LAB; TAT 18-24 HRS): SARS-CoV-2, NAA: NOT DETECTED

## 2018-10-03 NOTE — Anesthesia Preprocedure Evaluation (Addendum)
Anesthesia Evaluation  Patient identified by MRN, date of birth, ID band Patient awake    Reviewed: Allergy & Precautions, NPO status , Patient's Chart, lab work & pertinent test results, reviewed documented beta blocker date and time   History of Anesthesia Complications Negative for: history of anesthetic complications  Airway Mallampati: II  TM Distance: >3 FB Neck ROM: Full    Dental  (+) Dental Advisory Given, Teeth Intact   Pulmonary former smoker,    breath sounds clear to auscultation       Cardiovascular hypertension, Pt. on medications and Pt. on home beta blockers + dysrhythmias Atrial Fibrillation  Rhythm:Irregular Rate:Normal   Follows with cardiology Dr. Cleora Fleet for afib. Cleared for surgery per OV note 10/01/18 "Patient scheduled for laminectomy by Dr. Vertell Limber, after evaluating the patient...seems to be low risk for the planned procedure."  Summary of cardiac testing from Dr. Milta Deiters notes: "2013 LHC normal coronaries EF 60%.  05/27/2015 Lexiscan: Likely normal Lexiscan myocardial perfusion imaging study.  There is a small area of moderately decreased counts in the distal inferior wall with reversibility.  This likely represents diaphragmatic attenuation; however, a small area of distal inferior infarct cannot be fully excluded.  Calculated ejection fraction 65% with normal wall motion.")     Neuro/Psych  Headaches,  Neuromuscular disease (neuropathy, carpal tunnel syndrome) negative psych ROS   GI/Hepatic Neg liver ROS, hiatal hernia, GERD  Controlled and Medicated,  Endo/Other  negative endocrine ROS  Renal/GU negative Renal ROS    BPH     Musculoskeletal  (+) Arthritis ,  Polymyalgia rheumatica    Abdominal   Peds  Hematology  (+) anemia ,   Anesthesia Other Findings   Reproductive/Obstetrics                           Anesthesia Physical Anesthesia Plan  ASA:  III  Anesthesia Plan: General   Post-op Pain Management:    Induction: Intravenous  PONV Risk Score and Plan: 3 and Treatment may vary due to age or medical condition, Ondansetron and Propofol infusion  Airway Management Planned: Oral ETT  Additional Equipment: None  Intra-op Plan:   Post-operative Plan: Extubation in OR  Informed Consent: I have reviewed the patients History and Physical, chart, labs and discussed the procedure including the risks, benefits and alternatives for the proposed anesthesia with the patient or authorized representative who has indicated his/her understanding and acceptance.     Dental advisory given  Plan Discussed with: CRNA and Anesthesiologist  Anesthesia Plan Comments:      Anesthesia Quick Evaluation

## 2018-10-04 ENCOUNTER — Inpatient Hospital Stay (HOSPITAL_COMMUNITY)
Admission: RE | Admit: 2018-10-04 | Discharge: 2018-10-05 | DRG: 460 | Disposition: A | Discharge status: Home / Self Care | Payer: Medicare Other | Attending: Neurosurgery | Admitting: Neurosurgery

## 2018-10-04 ENCOUNTER — Inpatient Hospital Stay (HOSPITAL_COMMUNITY): Payer: Medicare Other | Admitting: Certified Registered Nurse Anesthetist

## 2018-10-04 ENCOUNTER — Ambulatory Visit: Payer: Medicare Other | Admitting: "Endocrinology

## 2018-10-04 ENCOUNTER — Encounter (HOSPITAL_COMMUNITY): Payer: Self-pay | Admitting: Certified Registered Nurse Anesthetist

## 2018-10-04 ENCOUNTER — Inpatient Hospital Stay (HOSPITAL_COMMUNITY): Payer: Medicare Other

## 2018-10-04 ENCOUNTER — Other Ambulatory Visit: Payer: Self-pay

## 2018-10-04 ENCOUNTER — Inpatient Hospital Stay (HOSPITAL_COMMUNITY): Payer: Medicare Other | Admitting: Physician Assistant

## 2018-10-04 ENCOUNTER — Encounter (HOSPITAL_COMMUNITY)
Admission: RE | Disposition: A | Discharge status: Home / Self Care | Payer: Self-pay | Source: Home / Self Care | Attending: Neurosurgery

## 2018-10-04 DIAGNOSIS — Z981 Arthrodesis status: Secondary | ICD-10-CM

## 2018-10-04 DIAGNOSIS — Z419 Encounter for procedure for purposes other than remedying health state, unspecified: Secondary | ICD-10-CM

## 2018-10-04 DIAGNOSIS — I4891 Unspecified atrial fibrillation: Secondary | ICD-10-CM | POA: Diagnosis present

## 2018-10-04 DIAGNOSIS — M48062 Spinal stenosis, lumbar region with neurogenic claudication: Secondary | ICD-10-CM | POA: Diagnosis present

## 2018-10-04 DIAGNOSIS — R296 Repeated falls: Secondary | ICD-10-CM | POA: Diagnosis present

## 2018-10-04 DIAGNOSIS — I10 Essential (primary) hypertension: Secondary | ICD-10-CM | POA: Diagnosis present

## 2018-10-04 DIAGNOSIS — M5126 Other intervertebral disc displacement, lumbar region: Secondary | ICD-10-CM | POA: Diagnosis present

## 2018-10-04 DIAGNOSIS — Z1159 Encounter for screening for other viral diseases: Secondary | ICD-10-CM | POA: Diagnosis not present

## 2018-10-04 DIAGNOSIS — S32029A Unspecified fracture of second lumbar vertebra, initial encounter for closed fracture: Secondary | ICD-10-CM | POA: Diagnosis present

## 2018-10-04 DIAGNOSIS — X58XXXA Exposure to other specified factors, initial encounter: Secondary | ICD-10-CM | POA: Diagnosis present

## 2018-10-04 DIAGNOSIS — M19019 Primary osteoarthritis, unspecified shoulder: Secondary | ICD-10-CM | POA: Diagnosis present

## 2018-10-04 DIAGNOSIS — M5116 Intervertebral disc disorders with radiculopathy, lumbar region: Principal | ICD-10-CM | POA: Diagnosis present

## 2018-10-04 LAB — PROTIME-INR
INR: 1 (ref 0.8–1.2)
Prothrombin Time: 13 seconds (ref 11.4–15.2)

## 2018-10-04 SURGERY — POSTERIOR LUMBAR FUSION 1 LEVEL
Anesthesia: General | Site: Back

## 2018-10-04 MED ORDER — THROMBIN 20000 UNITS EX SOLR
CUTANEOUS | Status: AC
  Filled 2018-10-04: qty 20000

## 2018-10-04 MED ORDER — BISACODYL 10 MG RE SUPP: 10.0000 mg | Freq: Every day | RECTAL | Status: DC | PRN

## 2018-10-04 MED ORDER — METHOCARBAMOL 1000 MG/10ML IJ SOLN
500.0000 mg | Freq: Four times a day (QID) | INTRAVENOUS | Status: DC | PRN
  Filled 2018-10-04: qty 5

## 2018-10-04 MED ORDER — AMLODIPINE BESY-BENAZEPRIL HCL 5-10 MG PO CAPS: 1.0000 | ORAL_CAPSULE | Freq: Every day | ORAL | Status: DC

## 2018-10-04 MED ORDER — BUPIVACAINE LIPOSOME 1.3 % IJ SUSP
INTRAMUSCULAR | Status: DC | PRN
  Administered 2018-10-04: 20 mL

## 2018-10-04 MED ORDER — VANCOMYCIN HCL 10 G IV SOLR
1250.0000 mg | INTRAVENOUS | Status: DC
  Administered 2018-10-04: 1250 mg via INTRAVENOUS
  Filled 2018-10-04 (×2): qty 1250

## 2018-10-04 MED ORDER — PANTOPRAZOLE SODIUM 40 MG PO TBEC
40.0000 mg | DELAYED_RELEASE_TABLET | Freq: Every day | ORAL | Status: DC
  Administered 2018-10-05: 40 mg via ORAL
  Filled 2018-10-04: qty 1

## 2018-10-04 MED ORDER — BUPIVACAINE HCL (PF) 0.5 % IJ SOLN
INTRAMUSCULAR | Status: AC
  Filled 2018-10-04: qty 30

## 2018-10-04 MED ORDER — CEFAZOLIN SODIUM-DEXTROSE 2-4 GM/100ML-% IV SOLN
2.0000 g | INTRAVENOUS | Status: AC
  Administered 2018-10-04: 2 g via INTRAVENOUS
  Filled 2018-10-04: qty 100

## 2018-10-04 MED ORDER — HYDROCODONE-ACETAMINOPHEN 7.5-325 MG PO TABS
2.0000 | ORAL_TABLET | ORAL | Status: DC | PRN
  Administered 2018-10-04: 21:00:00 2 via ORAL
  Filled 2018-10-04: qty 2

## 2018-10-04 MED ORDER — SODIUM CHLORIDE 0.9 % IV SOLN
INTRAVENOUS | Status: DC | PRN
  Administered 2018-10-04: 20 ug/min via INTRAVENOUS

## 2018-10-04 MED ORDER — SIMVASTATIN 20 MG PO TABS
40.0000 mg | ORAL_TABLET | Freq: Every day | ORAL | Status: DC
  Administered 2018-10-05: 40 mg via ORAL
  Filled 2018-10-04: qty 2

## 2018-10-04 MED ORDER — CLINDAMYCIN HCL 150 MG PO CAPS
150.0000 mg | ORAL_CAPSULE | Freq: Three times a day (TID) | ORAL | Status: DC
  Administered 2018-10-04 – 2018-10-05 (×2): 150 mg via ORAL
  Filled 2018-10-04 (×4): qty 1

## 2018-10-04 MED ORDER — THROMBIN 5000 UNITS EX SOLR
CUTANEOUS | Status: AC
  Filled 2018-10-04: qty 5000

## 2018-10-04 MED ORDER — ONDANSETRON HCL 4 MG/2ML IJ SOLN
INTRAMUSCULAR | Status: DC | PRN
  Administered 2018-10-04: 4 mg via INTRAVENOUS

## 2018-10-04 MED ORDER — ACETAMINOPHEN 500 MG PO TABS: 1000.0000 mg | ORAL_TABLET | Freq: Every evening | ORAL | Status: DC | PRN

## 2018-10-04 MED ORDER — LIDOCAINE 2% (20 MG/ML) 5 ML SYRINGE
INTRAMUSCULAR | Status: AC
  Filled 2018-10-04: qty 5

## 2018-10-04 MED ORDER — PROPOFOL 10 MG/ML IV BOLUS
INTRAVENOUS | Status: DC | PRN
  Administered 2018-10-04: 100 mg via INTRAVENOUS

## 2018-10-04 MED ORDER — SODIUM CHLORIDE 0.9% FLUSH: 3.0000 mL | INTRAVENOUS | Status: DC | PRN

## 2018-10-04 MED ORDER — TIZANIDINE HCL 4 MG PO TABS
4.0000 mg | ORAL_TABLET | Freq: Every day | ORAL | Status: DC
  Administered 2018-10-04: 21:00:00 4 mg via ORAL
  Filled 2018-10-04: qty 1

## 2018-10-04 MED ORDER — CHLORHEXIDINE GLUCONATE CLOTH 2 % EX PADS
6.0000 | MEDICATED_PAD | Freq: Every day | CUTANEOUS | Status: DC
  Administered 2018-10-05: 06:00:00 6 via TOPICAL

## 2018-10-04 MED ORDER — PREDNISONE 5 MG PO TABS
5.0000 mg | ORAL_TABLET | Freq: Every day | ORAL | Status: DC
  Administered 2018-10-05: 5 mg via ORAL
  Filled 2018-10-04: qty 1

## 2018-10-04 MED ORDER — FENTANYL CITRATE (PF) 250 MCG/5ML IJ SOLN
INTRAMUSCULAR | Status: AC
  Filled 2018-10-04: qty 5

## 2018-10-04 MED ORDER — OXYCODONE HCL 5 MG PO TABS: 5.0000 mg | ORAL_TABLET | ORAL | Status: DC | PRN

## 2018-10-04 MED ORDER — ONDANSETRON HCL 4 MG/2ML IJ SOLN: 4.0000 mg | Freq: Once | INTRAMUSCULAR | Status: DC | PRN

## 2018-10-04 MED ORDER — THROMBIN 5000 UNITS EX SOLR
OROMUCOSAL | Status: DC | PRN
  Administered 2018-10-04: 12:00:00 via TOPICAL

## 2018-10-04 MED ORDER — CHLORHEXIDINE GLUCONATE CLOTH 2 % EX PADS: 6.0000 | MEDICATED_PAD | Freq: Once | CUTANEOUS | Status: DC

## 2018-10-04 MED ORDER — ONDANSETRON HCL 4 MG/2ML IJ SOLN: 4.0000 mg | Freq: Four times a day (QID) | INTRAMUSCULAR | Status: DC | PRN

## 2018-10-04 MED ORDER — ONDANSETRON HCL 4 MG/2ML IJ SOLN
INTRAMUSCULAR | Status: AC
  Filled 2018-10-04: qty 2

## 2018-10-04 MED ORDER — ROCURONIUM BROMIDE 10 MG/ML (PF) SYRINGE
PREFILLED_SYRINGE | INTRAVENOUS | Status: DC | PRN
  Administered 2018-10-04: 50 mg via INTRAVENOUS

## 2018-10-04 MED ORDER — DOCUSATE SODIUM 100 MG PO CAPS
100.0000 mg | ORAL_CAPSULE | Freq: Two times a day (BID) | ORAL | Status: DC
  Administered 2018-10-04 – 2018-10-05 (×2): 100 mg via ORAL
  Filled 2018-10-04 (×2): qty 1

## 2018-10-04 MED ORDER — OXYCODONE HCL 5 MG PO TABS
ORAL_TABLET | ORAL | Status: AC
  Filled 2018-10-04: qty 1

## 2018-10-04 MED ORDER — LEFLUNOMIDE 20 MG PO TABS
20.0000 mg | ORAL_TABLET | Freq: Every day | ORAL | Status: DC
  Administered 2018-10-05: 09:00:00 20 mg via ORAL
  Filled 2018-10-04: qty 1

## 2018-10-04 MED ORDER — OXYCODONE HCL 5 MG PO TABS
5.0000 mg | ORAL_TABLET | Freq: Once | ORAL | Status: AC | PRN
  Administered 2018-10-04: 5 mg via ORAL

## 2018-10-04 MED ORDER — DIPHENHYDRAMINE-APAP (SLEEP) 25-500 MG PO TABS: 2.0000 | ORAL_TABLET | Freq: Every evening | ORAL | Status: DC | PRN

## 2018-10-04 MED ORDER — DIPHENHYDRAMINE HCL 25 MG PO CAPS: 50.0000 mg | ORAL_CAPSULE | Freq: Every evening | ORAL | Status: DC | PRN

## 2018-10-04 MED ORDER — POLYETHYLENE GLYCOL 3350 17 G PO PACK: 17.0000 g | PACK | Freq: Every day | ORAL | Status: DC | PRN

## 2018-10-04 MED ORDER — DEXAMETHASONE SODIUM PHOSPHATE 10 MG/ML IJ SOLN
INTRAMUSCULAR | Status: AC
  Filled 2018-10-04: qty 1

## 2018-10-04 MED ORDER — THROMBIN 20000 UNITS EX SOLR
CUTANEOUS | Status: DC | PRN
  Administered 2018-10-04: 12:00:00 via TOPICAL

## 2018-10-04 MED ORDER — EPHEDRINE SULFATE-NACL 50-0.9 MG/10ML-% IV SOSY
PREFILLED_SYRINGE | INTRAVENOUS | Status: DC | PRN
  Administered 2018-10-04 (×2): 5 mg via INTRAVENOUS

## 2018-10-04 MED ORDER — VANCOMYCIN HCL 1000 MG IV SOLR
INTRAVENOUS | Status: AC
  Filled 2018-10-04: qty 1000

## 2018-10-04 MED ORDER — PROPOFOL 10 MG/ML IV BOLUS
INTRAVENOUS | Status: AC
  Filled 2018-10-04: qty 20

## 2018-10-04 MED ORDER — PANTOPRAZOLE SODIUM 40 MG IV SOLR: 40.0000 mg | Freq: Every day | INTRAVENOUS | Status: DC

## 2018-10-04 MED ORDER — OXYCODONE HCL 5 MG/5ML PO SOLN: 5.0000 mg | Freq: Once | ORAL | Status: AC | PRN

## 2018-10-04 MED ORDER — PHENYLEPHRINE HCL (PRESSORS) 10 MG/ML IV SOLN
INTRAVENOUS | Status: AC
  Filled 2018-10-04: qty 1

## 2018-10-04 MED ORDER — FENTANYL CITRATE (PF) 100 MCG/2ML IJ SOLN
INTRAMUSCULAR | Status: AC
  Filled 2018-10-04: qty 2

## 2018-10-04 MED ORDER — SODIUM CHLORIDE 0.9 % IV SOLN: 250.0000 mL | INTRAVENOUS | Status: DC

## 2018-10-04 MED ORDER — KCL IN DEXTROSE-NACL 20-5-0.45 MEQ/L-%-% IV SOLN
INTRAVENOUS | Status: DC
  Administered 2018-10-04: 18:00:00 via INTRAVENOUS
  Filled 2018-10-04: qty 1000

## 2018-10-04 MED ORDER — FENTANYL CITRATE (PF) 100 MCG/2ML IJ SOLN
INTRAMUSCULAR | Status: DC | PRN
  Administered 2018-10-04: 50 ug via INTRAVENOUS
  Administered 2018-10-04: 100 ug via INTRAVENOUS

## 2018-10-04 MED ORDER — ONDANSETRON HCL 4 MG PO TABS: 4.0000 mg | ORAL_TABLET | Freq: Four times a day (QID) | ORAL | Status: DC | PRN

## 2018-10-04 MED ORDER — LIDOCAINE-EPINEPHRINE 1 %-1:100000 IJ SOLN
INTRAMUSCULAR | Status: AC
  Filled 2018-10-04: qty 1

## 2018-10-04 MED ORDER — LIDOCAINE-EPINEPHRINE 1 %-1:100000 IJ SOLN
INTRAMUSCULAR | Status: DC | PRN
  Administered 2018-10-04: 10 mL

## 2018-10-04 MED ORDER — 0.9 % SODIUM CHLORIDE (POUR BTL) OPTIME
TOPICAL | Status: DC | PRN
  Administered 2018-10-04: 1000 mL

## 2018-10-04 MED ORDER — ALUM & MAG HYDROXIDE-SIMETH 200-200-20 MG/5ML PO SUSP: 30.0000 mL | Freq: Four times a day (QID) | ORAL | Status: DC | PRN

## 2018-10-04 MED ORDER — TAMSULOSIN HCL 0.4 MG PO CAPS
0.4000 mg | ORAL_CAPSULE | Freq: Every day | ORAL | Status: DC
  Administered 2018-10-05: 09:00:00 0.4 mg via ORAL
  Filled 2018-10-04: qty 1

## 2018-10-04 MED ORDER — FLEET ENEMA 7-19 GM/118ML RE ENEM: 1.0000 | ENEMA | Freq: Once | RECTAL | Status: DC | PRN

## 2018-10-04 MED ORDER — MENTHOL 3 MG MT LOZG: 1.0000 | LOZENGE | OROMUCOSAL | Status: DC | PRN

## 2018-10-04 MED ORDER — SODIUM CHLORIDE 0.9% FLUSH
3.0000 mL | Freq: Two times a day (BID) | INTRAVENOUS | Status: DC
  Administered 2018-10-04 – 2018-10-05 (×2): 3 mL via INTRAVENOUS

## 2018-10-04 MED ORDER — MORPHINE SULFATE (PF) 2 MG/ML IV SOLN
2.0000 mg | INTRAVENOUS | Status: DC | PRN
  Administered 2018-10-05 (×2): 2 mg via INTRAVENOUS
  Filled 2018-10-04 (×2): qty 1

## 2018-10-04 MED ORDER — FENTANYL CITRATE (PF) 100 MCG/2ML IJ SOLN
25.0000 ug | INTRAMUSCULAR | Status: DC | PRN
  Administered 2018-10-04: 16:00:00 50 ug via INTRAVENOUS

## 2018-10-04 MED ORDER — SUCCINYLCHOLINE CHLORIDE 200 MG/10ML IV SOSY
PREFILLED_SYRINGE | INTRAVENOUS | Status: DC | PRN
  Administered 2018-10-04: 100 mg via INTRAVENOUS

## 2018-10-04 MED ORDER — HYDROCODONE-ACETAMINOPHEN 10-325 MG PO TABS: 1.0000 | ORAL_TABLET | Freq: Two times a day (BID) | ORAL | Status: DC | PRN

## 2018-10-04 MED ORDER — AMLODIPINE BESYLATE 5 MG PO TABS
5.0000 mg | ORAL_TABLET | Freq: Every day | ORAL | Status: DC
  Administered 2018-10-05: 09:00:00 5 mg via ORAL
  Filled 2018-10-04: qty 1

## 2018-10-04 MED ORDER — GABAPENTIN 400 MG PO CAPS
800.0000 mg | ORAL_CAPSULE | Freq: Two times a day (BID) | ORAL | Status: DC
  Administered 2018-10-04 – 2018-10-05 (×2): 800 mg via ORAL
  Filled 2018-10-04 (×2): qty 2

## 2018-10-04 MED ORDER — ACETAMINOPHEN 650 MG RE SUPP: 650.0000 mg | RECTAL | Status: DC | PRN

## 2018-10-04 MED ORDER — MUPIROCIN 2 % EX OINT
1.0000 "application " | TOPICAL_OINTMENT | Freq: Two times a day (BID) | CUTANEOUS | Status: DC
  Administered 2018-10-04 – 2018-10-05 (×2): 1 via NASAL
  Filled 2018-10-04: qty 22

## 2018-10-04 MED ORDER — METHOCARBAMOL 500 MG PO TABS
500.0000 mg | ORAL_TABLET | Freq: Four times a day (QID) | ORAL | Status: DC | PRN
  Administered 2018-10-04: 500 mg via ORAL
  Filled 2018-10-04: qty 1

## 2018-10-04 MED ORDER — ZOLPIDEM TARTRATE 5 MG PO TABS: 5.0000 mg | ORAL_TABLET | Freq: Every evening | ORAL | Status: DC | PRN

## 2018-10-04 MED ORDER — DEXAMETHASONE SODIUM PHOSPHATE 10 MG/ML IJ SOLN
INTRAMUSCULAR | Status: DC | PRN
  Administered 2018-10-04: 10 mg via INTRAVENOUS

## 2018-10-04 MED ORDER — SUGAMMADEX SODIUM 200 MG/2ML IV SOLN
INTRAVENOUS | Status: DC | PRN
  Administered 2018-10-04 (×2): 100 mg via INTRAVENOUS

## 2018-10-04 MED ORDER — ACETAMINOPHEN 500 MG PO TABS: 1000.0000 mg | ORAL_TABLET | Freq: Every day | ORAL | Status: DC | PRN

## 2018-10-04 MED ORDER — BUPIVACAINE HCL (PF) 0.5 % IJ SOLN
INTRAMUSCULAR | Status: DC | PRN
  Administered 2018-10-04: 10 mL

## 2018-10-04 MED ORDER — ROCURONIUM BROMIDE 10 MG/ML (PF) SYRINGE
PREFILLED_SYRINGE | INTRAVENOUS | Status: AC
  Filled 2018-10-04: qty 10

## 2018-10-04 MED ORDER — LIDOCAINE 2% (20 MG/ML) 5 ML SYRINGE
INTRAMUSCULAR | Status: DC | PRN
  Administered 2018-10-04: 50 mg via INTRAVENOUS

## 2018-10-04 MED ORDER — BENAZEPRIL HCL 5 MG PO TABS
10.0000 mg | ORAL_TABLET | Freq: Every day | ORAL | Status: DC
  Administered 2018-10-05: 09:00:00 10 mg via ORAL
  Filled 2018-10-04: qty 2

## 2018-10-04 MED ORDER — PHENOL 1.4 % MT LIQD: 1.0000 | OROMUCOSAL | Status: DC | PRN

## 2018-10-04 MED ORDER — LACTATED RINGERS IV SOLN
INTRAVENOUS | Status: DC
  Administered 2018-10-04 (×2): via INTRAVENOUS

## 2018-10-04 MED ORDER — METOPROLOL SUCCINATE ER 25 MG PO TB24
25.0000 mg | ORAL_TABLET | Freq: Every day | ORAL | Status: DC
  Administered 2018-10-05: 09:00:00 25 mg via ORAL
  Filled 2018-10-04: qty 1

## 2018-10-04 MED ORDER — ACETAMINOPHEN 325 MG PO TABS: 650.0000 mg | ORAL_TABLET | ORAL | Status: DC | PRN

## 2018-10-04 SURGICAL SUPPLY — 75 items
BASKET BONE COLLECTION (BASKET) ×3 IMPLANT
BENZOIN TINCTURE PRP APPL 2/3 (GAUZE/BANDAGES/DRESSINGS) ×3 IMPLANT
BLADE CLIPPER SURG (BLADE) IMPLANT
BONE CANC CHIPS 20CC PCAN1/4 (Bone Implant) ×3 IMPLANT
BUR MATCHSTICK NEURO 3.0 LAGG (BURR) ×3 IMPLANT
BUR PRECISION FLUTE 5.0 (BURR) ×3 IMPLANT
CANISTER SUCT 3000ML PPV (MISCELLANEOUS) ×3 IMPLANT
CARTRIDGE OIL MAESTRO DRILL (MISCELLANEOUS) ×1 IMPLANT
CHIPS CANC BONE 20CC PCAN1/4 (Bone Implant) ×1 IMPLANT
CLOSURE WOUND 1/2 X4 (GAUZE/BANDAGES/DRESSINGS) ×1
CONT SPEC 4OZ CLIKSEAL STRL BL (MISCELLANEOUS) ×6 IMPLANT
COVER BACK TABLE 60X90IN (DRAPES) ×3 IMPLANT
COVER WAND RF STERILE (DRAPES) ×3 IMPLANT
DECANTER SPIKE VIAL GLASS SM (MISCELLANEOUS) ×3 IMPLANT
DERMABOND ADVANCED (GAUZE/BANDAGES/DRESSINGS) ×2
DERMABOND ADVANCED .7 DNX12 (GAUZE/BANDAGES/DRESSINGS) ×1 IMPLANT
DIFFUSER DRILL AIR PNEUMATIC (MISCELLANEOUS) ×3 IMPLANT
DRAPE C-ARM 42X72 X-RAY (DRAPES) ×3 IMPLANT
DRAPE C-ARMOR (DRAPES) ×3 IMPLANT
DRAPE LAPAROTOMY 100X72X124 (DRAPES) ×3 IMPLANT
DRAPE SURG 17X23 STRL (DRAPES) ×3 IMPLANT
DRSG OPSITE POSTOP 4X6 (GAUZE/BANDAGES/DRESSINGS) ×3 IMPLANT
DURAPREP 26ML APPLICATOR (WOUND CARE) ×3 IMPLANT
ELECT REM PT RETURN 9FT ADLT (ELECTROSURGICAL) ×3
ELECTRODE REM PT RTRN 9FT ADLT (ELECTROSURGICAL) ×1 IMPLANT
EVACUATOR 1/8 PVC DRAIN (DRAIN) ×3 IMPLANT
GAUZE 4X4 16PLY RFD (DISPOSABLE) IMPLANT
GAUZE SPONGE 4X4 12PLY STRL (GAUZE/BANDAGES/DRESSINGS) ×3 IMPLANT
GLOVE BIO SURGEON STRL SZ 6.5 (GLOVE) ×8 IMPLANT
GLOVE BIO SURGEON STRL SZ8 (GLOVE) ×6 IMPLANT
GLOVE BIO SURGEONS STRL SZ 6.5 (GLOVE) ×4
GLOVE BIOGEL PI IND STRL 8 (GLOVE) ×2 IMPLANT
GLOVE BIOGEL PI IND STRL 8.5 (GLOVE) ×2 IMPLANT
GLOVE BIOGEL PI INDICATOR 8 (GLOVE) ×4
GLOVE BIOGEL PI INDICATOR 8.5 (GLOVE) ×4
GLOVE ECLIPSE 8.0 STRL XLNG CF (GLOVE) ×6 IMPLANT
GLOVE EXAM NITRILE XL STR (GLOVE) IMPLANT
GLOVE INDICATOR 6.5 STRL GRN (GLOVE) ×6 IMPLANT
GOWN STRL REUS W/ TWL LRG LVL3 (GOWN DISPOSABLE) ×1 IMPLANT
GOWN STRL REUS W/ TWL XL LVL3 (GOWN DISPOSABLE) ×2 IMPLANT
GOWN STRL REUS W/TWL 2XL LVL3 (GOWN DISPOSABLE) ×6 IMPLANT
GOWN STRL REUS W/TWL LRG LVL3 (GOWN DISPOSABLE) ×2
GOWN STRL REUS W/TWL XL LVL3 (GOWN DISPOSABLE) ×4
HEMOSTAT POWDER KIT SURGIFOAM (HEMOSTASIS) ×3 IMPLANT
KIT BASIN OR (CUSTOM PROCEDURE TRAY) ×3 IMPLANT
KIT POSITION SURG JACKSON T1 (MISCELLANEOUS) ×3 IMPLANT
KIT TURNOVER KIT B (KITS) ×3 IMPLANT
MILL MEDIUM DISP (BLADE) ×3 IMPLANT
NEEDLE HYPO 25X1 1.5 SAFETY (NEEDLE) ×3 IMPLANT
NEEDLE SPNL 18GX3.5 QUINCKE PK (NEEDLE) IMPLANT
NS IRRIG 1000ML POUR BTL (IV SOLUTION) ×3 IMPLANT
OIL CARTRIDGE MAESTRO DRILL (MISCELLANEOUS) ×3
PACK LAMINECTOMY NEURO (CUSTOM PROCEDURE TRAY) ×3 IMPLANT
PAD ARMBOARD 7.5X6 YLW CONV (MISCELLANEOUS) ×9 IMPLANT
PATTIES SURGICAL .5 X.5 (GAUZE/BANDAGES/DRESSINGS) IMPLANT
PATTIES SURGICAL .5 X1 (DISPOSABLE) IMPLANT
PATTIES SURGICAL 1X1 (DISPOSABLE) IMPLANT
ROD RELINE O-H CON M 5.0/6.0MM (Rod) ×6 IMPLANT
ROD RELINE TI LORD 5.5X70 (Rod) ×6 IMPLANT
SCREW LOCK RELINE 5.5 TULIP (Screw) ×12 IMPLANT
SCREW RELINE-O POLY 6.5X50MM (Screw) ×6 IMPLANT
SPONGE LAP 4X18 RFD (DISPOSABLE) IMPLANT
SPONGE SURGIFOAM ABS GEL 100 (HEMOSTASIS) ×3 IMPLANT
STAPLER SKIN PROX WIDE 3.9 (STAPLE) IMPLANT
STRIP CLOSURE SKIN 1/2X4 (GAUZE/BANDAGES/DRESSINGS) ×2 IMPLANT
SUT VIC AB 1 CT1 18XBRD ANBCTR (SUTURE) ×2 IMPLANT
SUT VIC AB 1 CT1 8-18 (SUTURE) ×4
SUT VIC AB 2-0 CT1 18 (SUTURE) ×6 IMPLANT
SUT VIC AB 3-0 SH 8-18 (SUTURE) ×9 IMPLANT
SYR 20CC LL (SYRINGE) ×3 IMPLANT
SYR 5ML LL (SYRINGE) IMPLANT
TOWEL GREEN STERILE (TOWEL DISPOSABLE) ×3 IMPLANT
TOWEL GREEN STERILE FF (TOWEL DISPOSABLE) ×3 IMPLANT
TRAY FOLEY MTR SLVR 16FR STAT (SET/KITS/TRAYS/PACK) ×3 IMPLANT
WATER STERILE IRR 1000ML POUR (IV SOLUTION) ×3 IMPLANT

## 2018-10-04 NOTE — Brief Op Note (Signed)
10/04/2018  3:52 PM  PATIENT:  Kevin Carlson.  85 y.o. male  PRE-OPERATIVE DIAGNOSIS:  Herniated nucleus pulposus, Lumbar with severe stenosis, L 2 fracture, radiculopathy, lumbago  POST-OPERATIVE DIAGNOSIS:   Herniated nucleus pulposus, Lumbar with severe stenosis, L 2 fracture, radiculopathy, lumbago  PROCEDURE:  Procedure(s): Lumbar two three laminectomy/discectomy with extension of previous fusion (N/A) exploration of fusion, total laminectomy L 23 with microdiscectomy, pedicle screw fixation and posterolateral arthrodesis with autograft, allograft  SURGEON:  Surgeon(s) and Role:    Erline Levine, MD - Primary  PHYSICIAN ASSISTANT: Arnoldo Morale  ASSISTANTS: Poteat, RN  ANESTHESIA:   general  EBL:  50 mL   BLOOD ADMINISTERED:none  DRAINS: (Medium) Hemovact drain(s) in the epidural space with  Suction Open   LOCAL MEDICATIONS USED:  MARCAINE    and LIDOCAINE   SPECIMEN:  No Specimen  DISPOSITION OF SPECIMEN:  N/A  COUNTS:  YES  TOURNIQUET:  * No tourniquets in log *  DICTATION: Patient is 85 year old man with lumbar stenosis, large herniated disc L 23 and previous fusion L 34 and L 45 levels with stenosis at  L 23 and L 12 with severe disc herniation and severe back and bilateral leg pain.  It was elected to take him to surgery for exploration of previous fusion with decompression and fusion from L 2 to L 3 levels.   Procedure: Patient was placed in a prone position on the [place] table after smooth and uncomplicated induction of general endotracheal anesthesia. His low back was prepped and draped in usual sterile fashion with betadine scrub and DuraPrep. Area of incision was infiltrated with local lidocaine. Incision was made to the lumbodorsal fascia was incised and exposure was performed of the L 2 spinous processes laminae facet joint and transverse processes. Previous hardware was exposed.  This was covered with bone growth and required a chisel to remove the bone to  expose the previously placed hardware. The bone screws appeared to be solidly in bone and connectors were placed on each side.  Intraoperative x-ray was obtained which confirmed correct orientation with marker probes at L2 level. A total laminectomy of L 2 through L 3  levels was performed with disarticulation of the facet joints and thorough decompression was performed of both L 2 , L 3  nerve roots along with the common dural tube. Decompression was greater than would be typical for PLIF. A thorough discectomy with removal of free fragments at L3/4 and L4/5 level was performed with removal of a large free fragment behind the L 2 vertebral body. Microdiscectomy was performed with removal of multiple herniated disc fragments. The posterolateral region was extensively decorticated and pedicle probes were placed at L 2  bilaterally. Intraoperative fluoroscopy confirmed correct orientationin the AP and lateral plane. 50 x 6.5 mm pedicle screws were placed at L 2 bilaterally.  Final x-rays demonstrated well-positioned interbody grafts and pedicle screw fixation. A 70 mm lordotic rod was placed on the right and a 70 mm rod was placed on the left locked down in situ and the posterolateral region was packed with 20 cc bone graft on the right and a similar amount on the left. The wound was irrigated. A medium Hemovac drain was placed. Fascia was closed with 1 Vicryl sutures skin edges were reapproximated 2 and 3-0 Vicryl sutures. Long-acting Marcaine was injected in the deep musculature.  The wound was dressed with Dermabond and  an occlusive dressing the patient was extubated in the operating room and  taken to recovery in stable satisfactory condition she tolerated traction well counts were correct at the end of the case.   PLAN OF CARE: Admit to inpatient   PATIENT DISPOSITION:  PACU - hemodynamically stable.   Delay start of Pharmacological VTE agent (>24hrs) due to surgical blood loss or risk of bleeding: yes

## 2018-10-04 NOTE — Op Note (Signed)
10/04/2018  3:52 PM  PATIENT:  Kevin Carlson.  85 y.o. male  PRE-OPERATIVE DIAGNOSIS:  Herniated nucleus pulposus, Lumbar with severe stenosis, L 2 fracture, radiculopathy, lumbago  POST-OPERATIVE DIAGNOSIS:   Herniated nucleus pulposus, Lumbar with severe stenosis, L 2 fracture, radiculopathy, lumbago  PROCEDURE:  Procedure(s): Lumbar two three laminectomy/discectomy with extension of previous fusion (N/A) exploration of fusion, total laminectomy L 23 with microdiscectomy, pedicle screw fixation and posterolateral arthrodesis with autograft, allograft  SURGEON:  Surgeon(s) and Role:    Erline Levine, MD - Primary  PHYSICIAN ASSISTANT: Arnoldo Morale  ASSISTANTS: Poteat, RN  ANESTHESIA:   general  EBL:  50 mL   BLOOD ADMINISTERED:none  DRAINS: (Medium) Hemovact drain(s) in the epidural space with  Suction Open   LOCAL MEDICATIONS USED:  MARCAINE    and LIDOCAINE   SPECIMEN:  No Specimen  DISPOSITION OF SPECIMEN:  N/A  COUNTS:  YES  TOURNIQUET:  * No tourniquets in log *  DICTATION: Patient is 85 year old man with lumbar stenosis, large herniated disc L 23 and previous fusion L 34 and L 45 levels with stenosis at  L 23 and L 12 with severe disc herniation and severe back and bilateral leg pain.  It was elected to take him to surgery for exploration of previous fusion with decompression and fusion from L 2 to L 3 levels.   Procedure: Patient was placed in a prone position on the [place] table after smooth and uncomplicated induction of general endotracheal anesthesia. His low back was prepped and draped in usual sterile fashion with betadine scrub and DuraPrep. Area of incision was infiltrated with local lidocaine. Incision was made to the lumbodorsal fascia was incised and exposure was performed of the L 2 spinous processes laminae facet joint and transverse processes. Previous hardware was exposed.  This was covered with bone growth and required a chisel to remove the bone to  expose the previously placed hardware. The bone screws appeared to be solidly in bone and connectors were placed on each side.  Intraoperative x-ray was obtained which confirmed correct orientation with marker probes at L2 level. A total laminectomy of L 2 through L 3  levels was performed with disarticulation of the facet joints and thorough decompression was performed of both L 2 , L 3  nerve roots along with the common dural tube. Decompression was greater than would be typical for PLIF. A thorough discectomy with removal of free fragments at L3/4 and L4/5 level was performed with removal of a large free fragment behind the L 2 vertebral body. Microdiscectomy was performed with removal of multiple herniated disc fragments. The posterolateral region was extensively decorticated and pedicle probes were placed at L 2  bilaterally. Intraoperative fluoroscopy confirmed correct orientationin the AP and lateral plane. 50 x 6.5 mm pedicle screws were placed at L 2 bilaterally.  Final x-rays demonstrated well-positioned interbody grafts and pedicle screw fixation. A 70 mm lordotic rod was placed on the right and a 70 mm rod was placed on the left locked down in situ and the posterolateral region was packed with 20 cc bone graft on the right and a similar amount on the left. The wound was irrigated. A medium Hemovac drain was placed. Fascia was closed with 1 Vicryl sutures skin edges were reapproximated 2 and 3-0 Vicryl sutures. Long-acting Marcaine was injected in the deep musculature.  The wound was dressed with Dermabond and  an occlusive dressing the patient was extubated in the operating room and  taken to recovery in stable satisfactory condition she tolerated traction well counts were correct at the end of the case.   PLAN OF CARE: Admit to inpatient   PATIENT DISPOSITION:  PACU - hemodynamically stable.   Delay start of Pharmacological VTE agent (>24hrs) due to surgical blood loss or risk of bleeding: yes

## 2018-10-04 NOTE — Progress Notes (Signed)
Pharmacy Antibiotic Note:  Vancomycin  Kevin Carlson. is a 85 y.o. male S/P lumbar 2-3 laminectomy/discectomy with extension of previous fusion (N/A) exploration of fusion, total laminectomy L 23 with microdiscectomy, pedicle screw fixation and posterolateral arthrodesis with autograft, allograft; Hemovac drain in the epidural space. Pharmacy has been consulted for vancomycin dosing. Patient rec'd cefazolin pre-op, but is MRSA PCR positive.  Plan: Vancomycin 1250 mg IV Q 24 hrs while drain is in place (estimated vancomycin AUC on this regimen is 455.5; goal vancomycin AUC:  400-550)  Height: 5\' 11"  (180.3 cm) Weight: 177 lb 9.6 oz (80.6 kg) IBW/kg (Calculated) : 75.3  Temp (24hrs), Avg:97.5 F (36.4 C), Min:97 F (36.1 C), Max:97.9 F (36.6 C)  Recent Labs  Lab 10/02/18 1330  WBC 10.4  CREATININE 1.17    Estimated Creatinine Clearance: 48.3 mL/min (by C-G formula based on SCr of 1.17 mg/dL).    No Known Allergies  Antimicrobials this admission: 6/4 cefazolin 2 gm IV on call to OR  6/4 clindamycin 150 mg PO TID (home medication)  Microbiology results: 6/2 COVID: negative  6/2 MRSA PCR: positive  Thank you for allowing pharmacy to be a part of this patient's care.  Gillermina Hu, PharmD, BCPS, [facility] 10/04/2018 5:36 PM

## 2018-10-04 NOTE — Transfer of Care (Signed)
Immediate Anesthesia Transfer of Care Note  Patient: Kevin Carlson.  Procedure(s) Performed: Lumbar two three laminectomy/discectomy with extension of previous fusion (N/A Back)  Patient Location: PACU  Anesthesia Type:General  Level of Consciousness: awake, alert  and oriented  Airway & Oxygen Therapy: Patient Spontanous Breathing and Patient connected to nasal cannula oxygen  Post-op Assessment: Report given to RN and Post -op Vital signs reviewed and stable  Post vital signs: Reviewed and stable  Last Vitals:  Vitals Value Taken Time  BP    Temp    Pulse 75 10/04/2018  3:53 PM  Resp 16 10/04/2018  3:53 PM  SpO2 100 % 10/04/2018  3:53 PM  Vitals shown include unvalidated device data.  Last Pain:  Vitals:   10/04/18 1141  TempSrc:   PainSc: 0-No pain         Complications: No apparent anesthesia complications

## 2018-10-04 NOTE — Progress Notes (Signed)
Awake, alert, conversant.  Sore in back.  Leg strength full.  Leg pain and numbness improved.  Patient is doing well.

## 2018-10-04 NOTE — H&P (Signed)
Patient ID:   [id number] Patient: Kevin Carlson  Date of Birth: 02-14-1930 Visit Type: Office Visit   Date: 09/26/2018 12:45 PM Provider: Marchia Meiers. Vertell Limber MD   This 85 year old male presents for back and R leg.  HISTORY OF PRESENT ILLNESS: 1.  back and R leg  Tarek Cravens returns to review his lumbar MRI and this shows an extremely large disc herniation at the L23 level with severe spinal stenosis additionally there is a fracture of the inferior endplate of L2.  The patient continues to complain of severe intractable bilateral lower extremity pain, left greater than right.  His examination reveals hip flexor weakness bilaterally.  He is not able to get into a position in which she's able to get any relief.  previous surgically treated levels appear satisfactory and there do not appear to be any complications from his prior surgery or implanted hardware.      Medical/Surgical/Interim History Reviewed, no change.  Last detailed document date:03/04/2015.     PAST MEDICAL HISTORY, SURGICAL HISTORY, FAMILY HISTORY, SOCIAL HISTORY AND REVIEW OF SYSTEMS I have reviewed the patient's past medical, surgical, family and social history as well as the comprehensive review of systems as included on the [facility] history form dated 12/29/2017, which I have signed.  Family History: Reviewed, no changes.  Last detailed document date:03/04/2015.   Social History: Reviewed, no changes. Last detailed document date: 03/04/2015.    MEDICATIONS: (added, continued or stopped this visit) Started Medication Directions Instruction Stopped  amlodipine 5 mg-benazepril 10 mg capsule take 1 capsule by oral route  every day    lansoprazole 30 mg capsule,delayed release take 1 capsule by oral route  every day before a meal    metoprolol tartrate 25 mg tablet take 1 tablet by oral route 2 times every day    multivitamin tablet take 1 by oral route  every  day    Norco 10 mg-325 mg tablet take 1 tablet by oral route 3 times every day as needed for pain    prednisone 5 mg tablet take 1 tablet by oral route  every day    simvastatin 40 mg tablet take 1 tablet by oral route  every day in the evening    tamsulosin 0.4 mg capsule take 1 capsule by oral route  every day    tizanidine 4 mg tablet take 1 tablet by oral route  every day at bedtime      ALLERGIES: Ingredient Reaction Medication Name Comment NO KNOWN ALLERGIES    No known allergies.    PHYSICAL EXAM:  Vitals Date Temp F BP Pulse Ht In Wt Lb BMI BSA Pain Score 09/26/2018 97 126/80 74 74 174 22.34  6/10     IMPRESSION:  The patient has severe intractable and unrelenting bilateral lower extremity pain and weakness.  He says his legs give out on him and he has been falling.  He has a large disc herniation at L2-3 with severe spinal stenosis and has a fracture of the inferior endplate of L2.  PLAN: In light of these imaging findings, I have recommended to the patient that he undergo surgery.  I have recommended decompression and extension of fusion to the L2-3 level.  I will not place interbody last thesis given his new fracture of L2.  Risks and benefits were discussed in detail with the patient and he wishes to proceed with surgery.  He has an LSO brace which he will bring for surgery.  Given the severity of his weakness and the severity of his pain I have recommended that expedited surgery be performed.  Orders: Instruction(s)/Education: Assessment Instruction I10 Hypertension education  Completed Orders (this encounter) Order Details Reason Side Interpretation Result Initial Treatment Date Region Hypertension education Patient to follow up with primary care provider        Assessment/Plan  # Detail Type Description  1. Assessment Radiculopathy, lumbar region (M54.16).     2. 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     3. Assessment Herniated nucleus pulposus, lumbar (M51.26).     4. Assessment Low back pain (M54.5).     5. Assessment Essential (primary) hypertension (I10).       Pain Management Plan Pain Scale: 6/10. Method: Numeric Pain Intensity Scale. Location: back. Onset: 09/01/2014. Duration: varies. Quality: discomforting. Pain management follow-up plan of care: Patient taking medication as prescribed.  Fall Risk Plan The patient has not fallen in the last year.              Provider:  Marchia Meiers. Vertell Limber MD  09/27/2018 09:35 AM    Dictation edited by: Marchia Meiers. Vertell Limber    CC Providers: PCP  None   Otho Darner  [address] [place] [address]-               Electronically signed by Marchia Meiers Vertell Limber MD on 09/27/2018 09:35 AM

## 2018-10-04 NOTE — Interval H&P Note (Signed)
History and Physical Interval Note:  10/04/2018 12:48 PM  Kevin Carlson.  has presented today for surgery, with the diagnosis of Herniated nucleus pulposus, Lumbar.  The various methods of treatment have been discussed with the patient and family. After consideration of risks, benefits and other options for treatment, the patient has consented to  Procedure(s) with comments: Lumbar 2-3 laminectomy/discectomy with extension of previous fusion (N/A) - Lumbar 2-3 laminectomy/discectomy with extension of previous fusion as a surgical intervention.  The patient's history has been reviewed, patient examined, no change in status, stable for surgery.  I have reviewed the patient's chart and labs.  Questions were answered to the patient's satisfaction.     [name]

## 2018-10-04 NOTE — Anesthesia Procedure Notes (Signed)
Procedure Name: Intubation Date/Time: 10/04/2018 1:13 PM Performed by: Lowella Dell, CRNA Pre-anesthesia Checklist: Patient identified, Emergency Drugs available, Suction available and Patient being monitored Patient Re-evaluated:Patient Re-evaluated prior to induction Oxygen Delivery Method: Circle System Utilized Preoxygenation: Pre-oxygenation with 100% oxygen Induction Type: IV induction and Rapid sequence Laryngoscope Size: Mac and 4 Grade View: Grade I Tube type: Oral Tube size: 7.5 mm Number of attempts: 1 Airway Equipment and Method: Stylet Placement Confirmation: ETT inserted through vocal cords under direct vision,  positive ETCO2 and breath sounds checked- equal and bilateral Secured at: 22 cm Tube secured with: Tape Dental Injury: Teeth and Oropharynx as per pre-operative assessment

## 2018-10-04 NOTE — Anesthesia Postprocedure Evaluation (Signed)
Anesthesia Post Note  Patient: Kevin Carlson.  Procedure(s) Performed: Lumbar two three laminectomy/discectomy with extension of previous fusion (N/A Back)     Patient location during evaluation: PACU Anesthesia Type: General Level of consciousness: awake and alert Pain management: pain level controlled Vital Signs Assessment: post-procedure vital signs reviewed and stable Respiratory status: spontaneous breathing, nonlabored ventilation and respiratory function stable Cardiovascular status: blood pressure returned to baseline and stable Postop Assessment: no apparent nausea or vomiting Anesthetic complications: no    Last Vitals:  Vitals:   10/04/18 1623 10/04/18 1638  BP: 127/76 137/81  Pulse: 69 62  Resp: 20 18  Temp:    SpO2: 99% 97%    Last Pain:  Vitals:   10/04/18 1615  TempSrc:   PainSc: [place] Riven Beebe

## 2018-10-04 NOTE — Plan of Care (Signed)

## 2018-10-05 MED ORDER — HYDROCODONE-ACETAMINOPHEN 10-325 MG PO TABS: 1.0000 | ORAL_TABLET | Freq: Four times a day (QID) | ORAL | 0 refills | Status: DC | PRN

## 2018-10-05 NOTE — Progress Notes (Signed)
Foley removed at 06:01 this morning, balloon intact. Pt due to void. Update given to day shift RN. pt verbalized understanding to call staff with first void since foley was removed.

## 2018-10-05 NOTE — Plan of Care (Signed)
  Problem: Activity: Goal: Risk for activity intolerance will decrease Outcome: Progressing   Problem: Pain Managment: Goal: General experience of comfort will improve Outcome: Progressing   

## 2018-10-05 NOTE — Progress Notes (Addendum)
Subjective: Patient reports "I'm doing Ok. I've walked around and the therapist was happy"  Objective: Vital signs in last 24 hours: Temp:  [97 F (36.1 C)-97.9 F (36.6 C)] 97.9 F (36.6 C) (06/05 0226) Pulse Rate:  [53-74] 65 (06/05 0226) Resp:  [14-20] 18 (06/05 0226) BP: (120-161)/(63-81) 120/67 (06/05 0926) SpO2:  [97 %-100 %] 99 % (06/05 0226)  Intake/Output from previous day: 06/04 0701 - 06/05 0700 In: 2640.4 [P.O.:480; I.V.:1879.7; IV Piggyback:250.7] Out: 1100 [Urine:1000; Drains:50; Blood:50] Intake/Output this shift: Total I/O In: 360 [P.O.:360] Out: -   Alert, conversant. MAEW with good strength. he notes arthritic shoulder pain and mild lumbar pain with position changes. incision without erythema, swelling, or drainag beneath honeycomb and Dermabond. Hemovac ~60ml overnight.  Lab Results: Recent Labs    10/02/18 1330  WBC 10.4  HGB 10.3*  HCT 32.2*  PLT 207   BMET Recent Labs    10/02/18 1330  NA 140  K 4.4  CL 110  CO2 22  GLUCOSE 98  BUN 16  CREATININE 1.17  CALCIUM 8.9    Studies/Results: Dg Lumbar Spine 2-3 Views  Result Date: 10/04/2018 CLINICAL DATA:  L2-3 laminar EXAM: LUMBAR SPINE - 2-3 VIEW; DG C-ARM 61-120 MIN COMPARISON:  01/22/2018 FLUOROSCOPY TIME:  Radiation Exposure Index (as provided by the fluoroscopic device): Not available If the device does not provide the exposure index: Fluoroscopy Time:  16 seconds Number of Acquired Images:  3 FINDINGS: Pedicle screws are noted at L2 new from the prior exam. Posterior fixators have not been placed. IMPRESSION: Pedicle screws at L2 new from the prior exam. Electronically Signed   By: Inez Catalina M.D.   On: 10/04/2018 15:55   Dg C-arm 1-60 Min  Result Date: 10/04/2018 CLINICAL DATA:  L2-3 laminar EXAM: LUMBAR SPINE - 2-3 VIEW; DG C-ARM 61-120 MIN COMPARISON:  01/22/2018 FLUOROSCOPY TIME:  Radiation Exposure Index (as provided by the fluoroscopic device): Not available If the device does not  provide the exposure index: Fluoroscopy Time:  16 seconds Number of Acquired Images:  3 FINDINGS: Pedicle screws are noted at L2 new from the prior exam. Posterior fixators have not been placed. IMPRESSION: Pedicle screws at L2 new from the prior exam. Electronically Signed   By: Inez Catalina M.D.   On: 10/04/2018 15:55    Assessment/Plan: Improving  LOS: 1 day  Per DrStern, d/c Hemovac, d/c to home. Norco 10/325 will be eRx'ed for prn home use. Pt already has sufficient Tizanidine for spasms. He verbalizes understanding of d/c instructions and agrees to call office to schedule 3-4 wk f/u appt.   Verdis Prime 10/05/2018, 11:50 AM   Doing well.  Discharge home.

## 2018-10-05 NOTE — Progress Notes (Signed)
Physical Therapy Treatment & Discharge Patient Details Name: Kevin Carlson. MRN: [id number] DOB: 08-01-1929 Today's Date: 10/05/2018    History of Present Illness Pt is an 85 y.o. male s/p L2-3 laminectomy/discectomy with extension of previous fusion 10/04/18. PMH includes previous lumbar PLIF (2019), neuropathy, HTN, arthritis, osteopenia, polymyalgia rheumatica.   PT Comments    Pt seen for additional session having initially declined stair training with PT, but then requesting it with OT. Pt mod indep to ascend/descend 11 steps with single rail support. Pt must descend steps at home to access walk-in shower. Ambulating without DME at supervision-level; mod indep ambulation with RW. Encouraged use of RW initially at home for added stability. Pt has met short-term acute PT goals; has no further questions or concerns. Will d/c acute PT.   Follow Up Recommendations  No PT follow up;Supervision - Intermittent     Equipment Recommendations  None recommended by PT    Recommendations for Other Services       Precautions / Restrictions Precautions Precautions: Fall;Back Precaution Booklet Issued: Yes (comment) Precaution Comments: Pt's brace is at home Required Braces or Orthoses: Spinal Brace Spinal Brace: Lumbar corset Restrictions Weight Bearing Restrictions: No    Mobility  Bed Mobility Overal bed mobility: Modified Independent             General bed mobility comments: Received sitting in recliner  Transfers Overall transfer level: Modified independent Equipment used: Rolling walker (2 wheeled);None Transfers: Sit to/from Stand Sit to Stand: Supervision         General transfer comment: Stood from bed and recliner to RW, cues for correct hand placement; supervision for safety  Ambulation/Gait Ambulation/Gait assistance: Supervision;Modified independent (Device/Increase time) Gait Distance (Feet): 140 Feet Assistive device: Rolling walker (2  wheeled);None Gait Pattern/deviations: Step-through pattern;Decreased stride length Gait velocity: Decreased Gait velocity interpretation: 1.31 - 2.62 ft/sec, indicative of limited community ambulator General Gait Details: Mod indep ambulation with RW. Additonal ambulation without DME and slow, guarded gait; supervision for safety. Encouraged use of RW for added stability at least first few days at home   Stairs Stairs: Yes Stairs assistance: Modified independent (Device/Increase time) Stair Management: One rail Left;Step to pattern;Forwards Number of Stairs: 11 General stair comments: Ascend/descended 11 steps mod indep with single UE support on L-side rail. Pt to go downstairs to use shower; plans to leave RW on main floor, discussed safety with this ambulating downstairs, potentially leaving SPC downstairs to use for added stability without RW   Wheelchair Mobility    Modified Rankin (Stroke Patients Only)       Balance Overall balance assessment: Needs assistance   Sitting balance-Leahy Scale: Good       Standing balance-Leahy Scale: Fair Standing balance comment: Can ambulate without DME at [place]: Awake/alert Behavior During Therapy: WFL for tasks assessed/performed Overall Cognitive Status: Within Functional Limits for tasks assessed                                        Exercises      General Comments General comments (skin integrity, edema, etc.): Discussed donning/doffing spinal brace; pt reports has used brace with previous sx and feels confident getting on/off while maintaining no twisting precautions  Pertinent Vitals/Pain Pain Assessment: No/denies pain Faces Pain Scale: Hurts a little bit Pain Location: Lower back Pain Descriptors / Indicators: Discomfort Pain Intervention(s): Monitored during session    Home Living Family/patient expects to be  discharged to:: Private residence Living Arrangements: Alone Available Help at Discharge: Family;Friend(s);Available PRN/intermittently Type of Home: House Home Access: Stairs to enter   Home Layout: Two level;Able to live on main level with bedroom/bathroom(shower is downstairs) Home Equipment: Gilford Rile - 2 wheels;Cane - single point      Prior Function Level of Independence: Independent          PT Goals (current goals can now be found in the care plan section) Acute Rehab PT Goals Patient Stated Goal: Return home today and see dog PT Goal Formulation: With patient Time For Goal Achievement: 10/19/18 Potential to Achieve Goals: Good Progress towards PT goals: Goals met/education completed, patient discharged from PT    Frequency    Min 5X/week      PT Plan Current plan remains appropriate    Co-evaluation              AM-PAC PT "6 Clicks" Mobility   Outcome Measure  Help needed turning from your back to your side while in a flat bed without using bedrails?: None Help needed moving from lying on your back to sitting on the side of a flat bed without using bedrails?: None Help needed moving to and from a bed to a chair (including a wheelchair)?: None Help needed standing up from a chair using your arms (e.g., wheelchair or bedside chair)?: None Help needed to walk in hospital room?: None Help needed climbing 3-5 steps with a railing? : None 6 Click Score: 24    End of Session Equipment Utilized During Treatment: Gait belt Activity Tolerance: Patient tolerated treatment well Patient left: in chair;with call bell/phone within reach Nurse Communication: Mobility status PT Visit Diagnosis: Other abnormalities of gait and mobility (R26.89)     Time: 0950-1003 PT Time Calculation (min) (ACUTE ONLY): 13 min  Charges:  $Gait Training: 8-22 mins                    Mabeline Caras, PT, DPT Acute Rehabilitation Services  Pager [phone] Office  [place] 10/05/2018, 10:31 AM

## 2018-10-05 NOTE — Evaluation (Signed)
Physical Therapy Evaluation Patient Details Name: Kevin Carlson. MRN: [id number] DOB: [birth year] Today's Date: 10/05/2018   History of Present Illness  Pt is an 85 y.o. male s/p L2-3 laminectomy/discectomy with extension of previous fusion 10/04/18. PMH includes previous lumbar PLIF (2019), neuropathy, HTN, arthritis, osteopenia, polymyalgia rheumatica.    Clinical Impression  Pt presents with an overall decrease in functional mobility secondary to above. PTA, pt indep and lives alone; multiple supportive family members nearby if needed. Educ on precautions, positioning, brace wear, and importance of mobility. Today, pt able to ambulate with RW at supervision-level. Pt would benefit from continued acute PT services to maximize functional mobility and independence prior to d/c home.     Follow Up Recommendations No PT follow up;Supervision - Intermittent    Equipment Recommendations  None recommended by PT    Recommendations for Other Services       Precautions / Restrictions Precautions Precautions: Fall Precaution Comments: Pt's brace is at home Required Braces or Orthoses: Spinal Brace Spinal Brace: Lumbar corset Restrictions Weight Bearing Restrictions: No      Mobility  Bed Mobility Overal bed mobility: Modified Independent             General bed mobility comments: Good ability to perform log roll  Transfers Overall transfer level: Needs assistance Equipment used: Rolling walker (2 wheeled) Transfers: Sit to/from Stand Sit to Stand: Supervision         General transfer comment: Stood from bed and recliner to RW, cues for correct hand placement; supervision for safety  Ambulation/Gait Ambulation/Gait assistance: Supervision Gait Distance (Feet): 80 Feet Assistive device: Rolling walker (2 wheeled) Gait Pattern/deviations: Step-through pattern;Decreased stride length Gait velocity: Decreased Gait velocity interpretation: 1.31 - 2.62 ft/sec, indicative of  limited community ambulator General Gait Details: Slow, steady gait with RW and supervision for safety. Max cues on maintaining back precautions as pt does not have spinal brace at hospital  Stairs Stairs: (Pt declined)          Wheelchair Mobility    Modified Rankin (Stroke Patients Only)       Balance Overall balance assessment: Needs assistance   Sitting balance-Leahy Scale: Good       Standing balance-Leahy Scale: Fair Standing balance comment: Can static stand and take steps without UE support                             Pertinent Vitals/Pain Pain Assessment: Faces Faces Pain Scale: Hurts a little bit Pain Location: Lower back Pain Descriptors / Indicators: Discomfort Pain Intervention(s): Monitored during session    Home Living Family/patient expects to be discharged to:: Private residence Living Arrangements: Alone Available Help at Discharge: Family;Friend(s);Available PRN/intermittently Type of Home: House Home Access: Stairs to enter   Entrance Stairs-Number of Steps: 1 Home Layout: Two level;Able to live on main level with bedroom/bathroom(shower is downstairs) Home Equipment: Gilford Rile - 2 wheels;Cane - single point      Prior Function Level of Independence: Independent               Hand Dominance        Extremity/Trunk Assessment   Upper Extremity Assessment Upper Extremity Assessment: Overall WFL for tasks assessed    Lower Extremity Assessment Lower Extremity Assessment: Overall WFL for tasks assessed       Communication   Communication: No difficulties  Cognition Arousal/Alertness: Awake/alert Behavior During Therapy: WFL for tasks assessed/performed Overall Cognitive Status: Within Functional  Limits for tasks assessed                                        General Comments General comments (skin integrity, edema, etc.): Discussed donning/doffing spinal brace; pt reports has used brace with  previous sx and feels confident getting on/off while maintaining no twisting precautions    Exercises     Assessment/Plan    PT Assessment Patient needs continued PT services  PT Problem List Decreased activity tolerance;Decreased balance;Decreased mobility;Decreased knowledge of use of DME;Decreased knowledge of precautions;Pain       PT Treatment Interventions DME instruction;Gait training;Stair training;Functional mobility training;Therapeutic activities;Therapeutic exercise;Balance training;Patient/family education    PT Goals (Current goals can be found in the Care Plan section)  Acute Rehab PT Goals Patient Stated Goal: Return home today and see dog PT Goal Formulation: With patient Time For Goal Achievement: 10/19/18 Potential to Achieve Goals: Good    Frequency Min 5X/week   Barriers to discharge        Co-evaluation               AM-PAC PT "6 Clicks" Mobility  Outcome Measure Help needed turning from your back to your side while in a flat bed without using bedrails?: None Help needed moving from lying on your back to sitting on the side of a flat bed without using bedrails?: None Help needed moving to and from a bed to a chair (including a wheelchair)?: A Little Help needed standing up from a chair using your arms (e.g., wheelchair or bedside chair)?: A Little Help needed to walk in hospital room?: A Little Help needed climbing 3-5 steps with a railing? : A Little 6 Click Score: 20    End of Session Equipment Utilized During Treatment: Gait belt Activity Tolerance: Patient tolerated treatment well Patient left: in chair;with call bell/phone within reach Nurse Communication: Mobility status PT Visit Diagnosis: Other abnormalities of gait and mobility (R26.89)    Time: 2778-2423 PT Time Calculation (min) (ACUTE ONLY): 19 min   Charges:   PT Evaluation $PT Eval Moderate Complexity: [place], PT, DPT Acute Rehabilitation Services   Pager [phone] Office [place] 10/05/2018, 10:25 AM

## 2018-10-05 NOTE — Discharge Summary (Signed)
Physician Discharge Summary  Patient ID: Kevin Carlson. MRN: [id number] DOB/AGE: [birth year] 83 y.o.  Admit date: 10/04/2018 Discharge date: 10/05/2018  Admission Diagnoses: Herniated nucleus pulposus, Lumbar with severe stenosis, L 2 fracture, radiculopathy, lumbago    Discharge Diagnoses: Herniated nucleus pulposus, Lumbar with severe stenosis, L 2 fracture, radiculopathy, lumbago s/p Lumbar two three laminectomy/discectomy with extension of previous fusion (N/A) exploration of fusion, total laminectomy L 23 with microdiscectomy, pedicle screw fixation and posterolateral arthrodesis with autograft, allograft     Active Problems:   Herniated lumbar disc without myelopathy   Discharged Condition: good  Hospital Course: Kevin Carlson was admitted for surgery with dx HNP, stenosis, and radiculopathy. Following uncomplicated surgery (above), he recovered nicely and transferred to [facility] for nursing care and therapies. He is mobilizing well.   Consults: None  Significant Diagnostic Studies: radiology: X-Ray: intra-op  Treatments: surgery: Lumbar two three laminectomy/discectomy with extension of previous fusion (N/A) exploration of fusion, total laminectomy L 23 with microdiscectomy, pedicle screw fixation and posterolateral arthrodesis with autograft, allograft    Discharge Exam: Blood pressure 120/67, pulse 65, temperature 97.9 F (36.6 C), temperature source Oral, resp. rate 18, height 5\' 11"  (1.803 m), weight 80.6 kg, SpO2 99 %. Alert, conversant. MAEW with good strength. he notes arthritic shoulder pain and mild lumbar pain with position changes. incision without erythema, swelling, or drainag beneath honeycomb and Dermabond. Hemovac ~6ml overnight.    Disposition: Discharge to home.  Rx'sNorco 5/325 and Robaxin 500mg will be eRx'ed from office to pts pharmacy for prn home use.He should continue his Gabapentin 800mg  bid.He has f/u appt  scheduled.    Allergies as of 10/05/2018   No Known Allergies      Discharge Instructions    Diet - low sodium heart healthy   Complete by:  As directed    Increase activity slowly   Complete by:  As directed      Allergies as of 10/05/2018   No Known Allergies     Medication List    TAKE these medications   acetaminophen 500 MG tablet Commonly known as:  TYLENOL Take 1,000 mg by mouth daily as needed for moderate pain or headache.   amLODipine-benazepril 5-10 MG capsule Commonly known as:  LOTREL Take 1 capsule by mouth daily.   clindamycin 150 MG capsule Commonly known as:  CLEOCIN Take 150 mg by mouth 3 (three) times daily.   diphenhydramine-acetaminophen 25-500 MG Tabs tablet Commonly known as:  TYLENOL PM Take 2 tablets by mouth at bedtime as needed (sleep).   Eliquis 5 MG Tabs tablet Generic drug:  apixaban Take 5 mg by mouth 2 (two) times daily.   gabapentin 800 MG tablet Commonly known as:  NEURONTIN Take 800 mg by mouth 2 (two) times daily.   HYDROcodone-acetaminophen 10-325 MG tablet Commonly known as:  NORCO Take 1 tablet by mouth every 6 (six) hours as needed for moderate pain. What changed:  when to take this   lansoprazole 30 MG capsule Commonly known as:  PREVACID Take 30 mg by mouth daily.   leflunomide 20 MG tablet Commonly known as:  ARAVA Take 20 mg by mouth daily.   metoprolol succinate 25 MG 24 hr tablet Commonly known as:  TOPROL-XL Take 25 mg by mouth daily.   predniSONE 5 MG tablet Commonly known as:  DELTASONE Take 5 mg by mouth daily with breakfast.   simvastatin 40 MG tablet Commonly known as:  ZOCOR Take 40 mg by mouth daily.  tamsulosin 0.4 MG Caps capsule Commonly known as:  FLOMAX Take 0.4 mg by mouth daily.   tiZANidine 4 MG tablet Commonly known as:  ZANAFLEX Take 4 mg by mouth at bedtime.        Signed: [name], MD 10/05/2018, 12:03 PM

## 2018-10-05 NOTE — Progress Notes (Signed)
Hemovac was removed without difficulty.The tip of the hemovac catheter is intact.  A small amount of bloody drainage noted from the upper incision site.  A new dressing has been applied.  Written and verbal discharge instructions have been given.  The patient has been provided written instructions.  The patient verbalizes knowledge of the 4-4 week follow up, prescription pick up and any signs or symptoms of possible infection.  I have called both phone numbers listed for his daughter Jenny Reichmann) but no answer.  I have left a message regarding the patient's discharge today

## 2018-10-05 NOTE — Progress Notes (Addendum)
Occupational Therapy Evaluation and Discharge Patient Details Name: Kevin Carlson. MRN: [id number] DOB: [birth year] Today's Date: 10/05/2018    History of Present Illness Pt is an 85 y.o. male s/p L2-3 laminectomy/discectomy with extension of previous fusion 10/04/18. PMH includes previous lumbar PLIF (2019), neuropathy, HTN, arthritis, osteopenia, polymyalgia rheumatica.   Clinical Impression   Pt s/p procedure listed above. PTA, pt is independent and lives alone but has family members who live close by and can provide support. Reviewed back precautions and educated pt on ADL techniques to assist with minimizing back pain and adherence to back precautions. Recommended use of shower seat to avoid bending and twisting (pt does not always use it at home). Pt at mod I level with ADLs and functional mobility using RW. Education completed. Will sign off.     Follow Up Recommendations  No OT follow up;Supervision - Intermittent    Equipment Recommendations  None recommended by OT    Recommendations for Other Services       Precautions / Restrictions Precautions Precautions: Fall;Back Precaution Booklet Issued: Yes (comment) Precaution Comments: Pt's brace is at home Required Braces or Orthoses: Spinal Brace Spinal Brace: Lumbar corset Restrictions Weight Bearing Restrictions: No      Mobility Bed Mobility Overal bed mobility: Modified Independent             General bed mobility comments: pt standing in bathroom on therapist arrival  Transfers Overall transfer level: Modified independent Equipment used: Rolling walker (2 wheeled)             Balance                           ADL either performed or assessed with clinical judgement   ADL Overall ADL's : Needs assistance/impaired Eating/Feeding: Independent;Sitting   Grooming: Wash/dry hands;Wash/dry face;Standing;Independent   Upper Body Bathing: Independent;Sitting   Lower Body Bathing: Modified  independent;Sit to/from stand   Upper Body Dressing : Independent;Sitting   Lower Body Dressing: Modified independent;Sit to/from stand   Toilet Transfer: Modified Independent;Ambulation;Grab bars;Comfort height toilet   Toileting- Clothing Manipulation and Hygiene: Modified independent;Sit to/from stand       Functional mobility during ADLs: Modified independent;Rolling walker General ADL Comments: Therapist reviewed back precautions and ADL techniques to assist with adhering to back precautions. Pt able to complete figure four pattern for LB ADLs.     Vision         Perception     Praxis      Pertinent Vitals/Pain Pain Assessment: Faces Faces Pain Scale: Hurts a little bit Pain Location: Lower back Pain Descriptors / Indicators: Discomfort Pain Intervention(s): Monitored during session     Hand Dominance Right   Extremity/Trunk Assessment Upper Extremity Assessment Upper Extremity Assessment: Overall WFL for tasks assessed   Lower Extremity Assessment Lower Extremity Assessment: Defer to PT evaluation       Communication Communication Communication: No difficulties   Cognition Arousal/Alertness: Awake/alert Behavior During Therapy: WFL for tasks assessed/performed Overall Cognitive Status: Within Functional Limits for tasks assessed                                     General Comments  Discussed donning/doffing spinal brace; pt reports has used brace with previous sx and feels confident getting on/off while maintaining no twisting precautions    Exercises     Shoulder Instructions  Home Living Family/patient expects to be discharged to:: Private residence Living Arrangements: Alone Available Help at Discharge: Family;Friend(s);Available PRN/intermittently Type of Home: House Home Access: Stairs to enter [employer] of Steps: 1 Entrance Stairs-Rails: Right Home Layout: Two level;Able to live on main level with  bedroom/bathroom Alternate Level Stairs-Number of Steps: 11 Alternate Level Stairs-Rails: Left Bathroom Shower/Tub: Hospital doctor Toilet: Handicapped height     Home Equipment: Environmental consultant - 2 wheels;Cane - single point;Shower seat          Prior Functioning/Environment Level of Independence: Independent        Comments: SPC        OT Problem List: Impaired balance (sitting and/or standing);Pain      OT Treatment/Interventions:      OT Goals(Current goals can be found in the care plan section) Acute Rehab OT Goals Patient Stated Goal: Return home today and see dog  OT Frequency:     Barriers to D/C:            Co-evaluation              AM-PAC OT "6 Clicks" Daily Activity     Outcome Measure Help from another person eating meals?: None Help from another person taking care of personal grooming?: None Help from another person toileting, which includes using toliet, bedpan, or urinal?: None Help from another person bathing (including washing, rinsing, drying)?: None Help from another person to put on and taking off regular upper body clothing?: None Help from another person to put on and taking off regular lower body clothing?: None 6 Click Score: 24   End of Session Equipment Utilized During Treatment: Rolling walker Nurse Communication: Mobility status  Activity Tolerance: Patient tolerated treatment well Patient left: in chair;with call bell/phone within reach;with nursing/sitter in room  OT Visit Diagnosis: Unsteadiness on feet (R26.81);Pain                Time: 0912-0926 OT Time Calculation (min): 14 min Charges:  OT General Charges $OT Visit: 1 Visit OT Evaluation $OT Eval Low Complexity: [place], [place] OTR/L [place] [phone] 10/05/2018, 11:17 AM

## 2018-10-09 MED FILL — Heparin Sodium (Porcine) Inj 1000 Unit/ML: INTRAMUSCULAR | Qty: 30 | Status: AC

## 2018-10-09 MED FILL — Sodium Chloride IV Soln 0.9%: INTRAVENOUS | Qty: 1000 | Status: AC

## 2019-02-07 ENCOUNTER — Other Ambulatory Visit: Payer: Self-pay | Admitting: Neurosurgery

## 2019-02-11 NOTE — H&P (Signed)
Patient ID:   [id number] Patient: Kevin Carlson  Date of Birth: [birth year] Visit Type: Office Visit   Date: 02/06/2019 02:45 PM Provider: Marchia Meiers. Vertell Limber MD   This 85 year old male presents for MRI Review.  HISTORY OF PRESENT ILLNESS:  1.  MRI Review  MRI review demonstrates recurrence of disc herniation with spinal stenosis at the L2-3 level.  There appears to be significant stenosis related to this pathology.  The patient says that "I and worth the damn"and complains that he is severely limited with ambulation.  I explained that it is tricky to go back in and redo diskectomy surgery when he has had a total laminectomy but the patient says that he cannot function the way he is and wants to go ahead with repeat surgery.         Medical/Surgical/Interim History Reviewed, no change.  Last detailed document date:03/04/2015.     PAST MEDICAL HISTORY, SURGICAL HISTORY, FAMILY HISTORY, SOCIAL HISTORY AND REVIEW OF SYSTEMS I have reviewed the patient's past medical, surgical, family and social history as well as the comprehensive review of systems as included on the [facility] history form dated 02/06/2019, which I have signed.  Family History:  Reviewed, no changes.  Last detailed document date:03/04/2015.   Social History: Reviewed, no changes. Last detailed document date: 03/04/2015.    MEDICATIONS: (added, continued or stopped this visit) Started Medication Directions Instruction Stopped   amlodipine 5 mg-benazepril 10 mg capsule take 1 capsule by oral route  every day    10/15/2018 Keflex 500 mg capsule take 1 capsule by oral route  every 8 hours     lansoprazole 30 mg capsule,delayed release take 1 capsule by oral route  every day before a meal     metoprolol tartrate 25 mg tablet take 1 tablet by oral route 2 times every day     multivitamin tablet take 1 by oral route  every day     Norco 10 mg-325 mg tablet take 1 tablet by oral route 3  times every day as needed for pain     prednisone 5 mg tablet take 1 tablet by oral route  every day     simvastatin 40 mg tablet take 1 tablet by oral route  every day in the evening     tamsulosin 0.4 mg capsule take 1 capsule by oral route  every day     tizanidine 4 mg tablet take 1 tablet by oral route  every day at bedtime       ALLERGIES: Ingredient Reaction Medication Name Comment  NO KNOWN ALLERGIES     No known allergies. Reviewed, no changes.    PHYSICAL EXAM:   Vitals Date Temp F BP Pulse Ht In Wt Lb BMI BSA Pain Score  02/06/2019 97.2 151/66 68 74 169 21.7  9/10      IMPRESSION:   Recurrent disc herniation L2-3 with significant spinal stenosis and right greater than left lower extremity pain.  PLAN:  Proceed with redo diskectomy L2-3 level bilaterally.  Orders: Instruction(s)/Education: Assessment Instruction  I10 Lifestyle education   Completed Orders (this encounter) Order Details Reason Side Interpretation Result Initial Treatment Date Region  Lifestyle education Patient will follow up with primary care physician.         Assessment/Plan   # Detail Type Description   1. Assessment Radiculopathy, lumbar region (M54.16).       2. 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  3. Assessment Herniated nucleus pulposus, lumbar (M51.26).       4. Assessment Essential (primary) hypertension (I10).         Pain Management Plan Pain Scale: 9/10. Method: Numeric Pain Intensity Scale. Location: back. Onset: 09/01/2014. Duration: varies. Quality: discomforting. Pain management follow-up plan of care: Patient will continue medication management.              Provider:  Marchia Meiers. Vertell Limber MD  02/09/2019 03:35 PM    Dictation edited by: Marchia Meiers. Vertell Limber    CC Providers: PCP  None   Otho Darner  [address] [address]-               Electronically signed by  Marchia Meiers Vertell Limber MD on 02/09/2019 03:35 PM

## 2019-02-21 NOTE — Pre-Procedure Instructions (Signed)
[place], [place] [address] Phone: [phone] Fax: [phone]      Your procedure is scheduled on Tuesday, October 27th.  Report to [facility] Main Entrance "A" at 1130 A.M., and check in at the Admitting office.  Call this number if you have problems the morning of surgery:  [phone]  Call [phone] if you have any questions prior to your surgery date Monday-Friday 8am-4pm    Remember:  Do not eat or drink after midnight the night before your surgery      Take these medicines the morning of surgery with A SIP OF WATER   gabapentin (NEURONTIN)   lansoprazole (PREVACID)  metoprolol succinate (TOPROL-XL)  predniSONE (DELTASONE)  simvastatin (ZOCOR)  tamsulosin (FLOMAX)   IF NEEDED:  guaiFENesin (MUCINEX)  HYDROcodone-acetaminophen (NORCO)  sodium chloride (OCEAN) nasal spray  As of today, STOP taking any Aspirin (unless otherwise instructed by your surgeon), NSAIDs such as leflunomide (ARAVA), Aleve, Naproxen, Ibuprofen, Motrin, Advil, Goody's, BC's, all herbal medications, fish oil, and all vitamins.    The Morning of Surgery  Do not wear jewelry.  Do not wear lotions, powders, colognes, or deodorant  Men may shave face and neck.  Do not bring valuables to the hospital.  [facility] is not responsible for any belongings or valuables.  If you are a smoker, DO NOT Smoke 24 hours prior to surgery IF you wear a CPAP at night please bring your mask, tubing, and machine the morning of surgery   Remember that you must have someone to transport you home after your surgery, and remain with you for 24 hours if you are discharged the same day.   Contacts, glasses, hearing aids, dentures or bridgework may not be worn into surgery.    Leave your suitcase in the car.  After surgery it may be brought to your room.  For patients admitted to the hospital, discharge time will be determined by  your treatment team.  Patients discharged the day of surgery will not be allowed to drive home.    Special instructions:   [place]- Preparing For Surgery  Before surgery, you can play an important role. Because skin is not sterile, your skin needs to be as free of germs as possible. You can reduce the number of germs on your skin by washing with CHG (chlorahexidine gluconate) Soap before surgery.  CHG is an antiseptic cleaner which kills germs and bonds with the skin to continue killing germs even after washing.    Oral Hygiene is also important to reduce your risk of infection.  Remember - BRUSH YOUR TEETH THE MORNING OF SURGERY WITH YOUR REGULAR TOOTHPASTE  Please do not use if you have an allergy to CHG or antibacterial soaps. If your skin becomes reddened/irritated stop using the CHG.  Do not shave (including legs and underarms) for at least 48 hours prior to first CHG shower. It is OK to shave your face.  Please follow these instructions carefully.   1. Shower the NIGHT BEFORE SURGERY and the MORNING OF SURGERY with CHG Soap.   2. If you chose to wash your hair, wash your hair first as usual with your normal shampoo.  3. After you shampoo, rinse your hair and body thoroughly to remove the shampoo.  4. Use CHG as you would any other liquid soap. You can apply CHG directly to the skin and wash gently with a scrungie or  a clean washcloth.   5. Apply the CHG Soap to your body ONLY FROM THE NECK DOWN.  Do not use on open wounds or open sores. Avoid contact with your eyes, ears, mouth and genitals (private parts). Wash Face and genitals (private parts)  with your normal soap.   6. Wash thoroughly, paying special attention to the area where your surgery will be performed.  7. Thoroughly rinse your body with warm water from the neck down.  8. DO NOT shower/wash with your normal soap after using and rinsing off the CHG Soap.  9. Pat yourself dry with a CLEAN TOWEL.  10. Wear CLEAN  PAJAMAS to bed the night before surgery, wear comfortable clothes the morning of surgery  11. Place CLEAN SHEETS on your bed the night of your first shower and DO NOT SLEEP WITH PETS.    Day of Surgery:  Do not apply any deodorants/lotions. Please shower the morning of surgery with the CHG soap  Please wear clean clothes to the hospital/surgery center.   Remember to brush your teeth WITH YOUR REGULAR TOOTHPASTE.   Please read over the following fact sheets that you were given.

## 2019-02-22 ENCOUNTER — Other Ambulatory Visit: Payer: Self-pay

## 2019-02-22 ENCOUNTER — Encounter (HOSPITAL_COMMUNITY)
Admission: RE | Admit: 2019-02-22 | Discharge: 2019-02-22 | Disposition: A | Discharge status: Home / Self Care | Payer: Medicare Other | Source: Ambulatory Visit | Attending: Neurosurgery | Admitting: Neurosurgery

## 2019-02-22 ENCOUNTER — Other Ambulatory Visit (HOSPITAL_COMMUNITY)
Admission: RE | Admit: 2019-02-22 | Discharge: 2019-02-22 | Disposition: A | Discharge status: Home / Self Care | Payer: Medicare Other | Source: Ambulatory Visit | Attending: Neurosurgery | Admitting: Neurosurgery

## 2019-02-22 ENCOUNTER — Encounter (HOSPITAL_COMMUNITY): Payer: Self-pay

## 2019-02-22 DIAGNOSIS — Z01818 Encounter for other preprocedural examination: Secondary | ICD-10-CM | POA: Diagnosis not present

## 2019-02-22 DIAGNOSIS — I2510 Atherosclerotic heart disease of native coronary artery without angina pectoris: Secondary | ICD-10-CM | POA: Diagnosis not present

## 2019-02-22 DIAGNOSIS — R9431 Abnormal electrocardiogram [ECG] [EKG]: Secondary | ICD-10-CM | POA: Diagnosis not present

## 2019-02-22 DIAGNOSIS — I4821 Permanent atrial fibrillation: Secondary | ICD-10-CM | POA: Insufficient documentation

## 2019-02-22 LAB — BASIC METABOLIC PANEL
Anion gap: 9 (ref 5–15)
BUN: 18 mg/dL (ref 8–23)
CO2: 23 mmol/L (ref 22–32)
Calcium: 8.9 mg/dL (ref 8.9–10.3)
Chloride: 106 mmol/L (ref 98–111)
Creatinine, Ser: 1.39 mg/dL — ABNORMAL HIGH (ref 0.61–1.24)
GFR calc Af Amer: 52 mL/min — ABNORMAL LOW (ref >60)
GFR calc non Af Amer: 45 mL/min — ABNORMAL LOW (ref >60)
Glucose, Bld: 126 mg/dL — ABNORMAL HIGH (ref 70–99)
Potassium: 5 mmol/L (ref 3.5–5.1)
Sodium: 138 mmol/L (ref 135–145)

## 2019-02-22 LAB — CBC
HCT: 34.1 % — ABNORMAL LOW (ref 39.0–52.0)
Hemoglobin: 10.9 g/dL — ABNORMAL LOW (ref 13.0–17.0)
MCH: 32.8 pg (ref 26.0–34.0)
MCHC: 32 g/dL (ref 30.0–36.0)
MCV: 102.7 fL — ABNORMAL HIGH (ref 80.0–100.0)
Platelets: 235 10*3/uL (ref 150–400)
RBC: 3.32 MIL/uL — ABNORMAL LOW (ref 4.22–5.81)
RDW: 19 % — ABNORMAL HIGH (ref 11.5–15.5)
WBC: 10.3 10*3/uL (ref 4.0–10.5)
nRBC: 0 % (ref 0.0–0.2)

## 2019-02-22 LAB — SURGICAL PCR SCREEN
MRSA, PCR: NEGATIVE
Staphylococcus aureus: NEGATIVE

## 2019-02-22 NOTE — Progress Notes (Signed)
PCP:  Dr. Kennieth Rad, MD Cardiologist:  DR. Cleora Fleet, [place]  EKG:  02/22/19 CXR:  03/21/15 ECHO:  7-8 years ago per patient Stress Test:  7-8 years ago per patient Cardiac Cath:  7-8 years ago per patient  Blood Thinners:  Eliquis.  Patient to call surgeon today with instructions.  Anesthesia Review:  Yes, history Afib.  Requested last office note and cardiac reports from Dr. Rosalita Chessman.  Covid test today, 02/22/19  Patient denies shortness of breath, fever, cough, and chest pain at PAT appointment.  Patient verbalized understanding of instructions provided today at the PAT appointment.  Patient asked to review instructions at home and day of surgery.

## 2019-02-23 LAB — NOVEL CORONAVIRUS, NAA (HOSP ORDER, SEND-OUT TO REF LAB; TAT 18-24 HRS): SARS-CoV-2, NAA: NOT DETECTED

## 2019-02-25 ENCOUNTER — Ambulatory Visit (INDEPENDENT_AMBULATORY_CARE_PROVIDER_SITE_OTHER): Payer: Medicare Other | Admitting: Internal Medicine

## 2019-02-25 NOTE — Anesthesia Preprocedure Evaluation (Addendum)
Anesthesia Evaluation  Patient identified by MRN, date of birth, ID band Patient awake    Reviewed: Allergy & Precautions, H&P , NPO status , Patient's Chart, lab work & pertinent test results  Airway Mallampati: II  TM Distance: >3 FB Neck ROM: full    Dental  (+) Teeth Intact, Dental Advisory Given   Pulmonary former smoker,    breath sounds clear to auscultation       Cardiovascular hypertension, + dysrhythmias Atrial Fibrillation  Rhythm:regular Rate:Normal     Neuro/Psych  Headaches,  Neuromuscular disease    GI/Hepatic hiatal hernia, GERD  ,  Endo/Other  Hyperthyroidism   Renal/GU      Musculoskeletal  (+) Arthritis ,   Abdominal   Peds  Hematology   Anesthesia Other Findings   Reproductive/Obstetrics                           Anesthesia Physical Anesthesia Plan  ASA: III  Anesthesia Plan: General   Post-op Pain Management:    Induction: Intravenous  PONV Risk Score and Plan: 2 and Ondansetron, Dexamethasone and Treatment may vary due to age or medical condition  Airway Management Planned: Oral ETT  Additional Equipment:   Intra-op Plan:   Post-operative Plan: Extubation in OR  Informed Consent: I have reviewed the patients History and Physical, chart, labs and discussed the procedure including the risks, benefits and alternatives for the proposed anesthesia with the patient or authorized representative who has indicated his/her understanding and acceptance.       Plan Discussed with: CRNA, Anesthesiologist and Surgeon  Anesthesia Plan Comments: (Follows with cardiology, Dr. Cleora Fleet, for hx of permanent afib and coronary atherosclerosis. Last seen 10/01/18 for preop clearance prior to lumbar laminectomy/discectomy with extension of previous fusion which was performed [id number] without complication. Per clearance at that time, "Patient scheduled for laminectomy by Dr.  Vertell Limber, after evaluating the patient, review of cardiac history, current cardiac status, medications, patient seems to be low risk for the planned procedure.  This was discussed with the patient.  A copy of this note will be forwarded to a surgeon.  Medication structures: Advised to hold Eliquis 24 to 48 hours prior to the procedure. Not also summarizes cardiac testing history: " 02/14/2012: Abnormal EST.  LHC with normal coronaries EF 60%.  05/27/2015 Lexiscan: Likely normal Lexiscan myocardial perfusion imaging study.  There is a small area of moderately decreased counts in the distal inferior wall without reversibility.  This likely represents diaphragmatic attenuation; however, a small area of distal inferior infarct cannot be fully occluded.  Calculated ejection fraction is 55% with normal wall motion."  EKG 02/22/19: Atrial fibrillation. Vent rate 70.)       Anesthesia Quick Evaluation

## 2019-02-25 NOTE — Progress Notes (Signed)
Anesthesia Chart Review: Follows with cardiology, Dr. Cleora Fleet, for hx of permanent afib and coronary atherosclerosis. Last seen 10/01/18 for preop clearance prior to lumbar laminectomy/discectomy with extension of previous fusion which was performed [id number] without complication. Per clearance at that time, "Patient scheduled for laminectomy by Dr. Vertell Limber, after evaluating the patient, review of cardiac history, current cardiac status, medications, patient seems to be low risk for the planned procedure.  This was discussed with the patient.  A copy of this note will be forwarded to a surgeon.  Medication structures: Advised to hold Eliquis 24 to 48 hours prior to the procedure. Not also summarizes cardiac testing history: " 02/14/2012: Abnormal EST.  LHC with normal coronaries EF 60%.  05/27/2015 Lexiscan: Likely normal Lexiscan myocardial perfusion imaging study.  There is a small area of moderately decreased counts in the distal inferior wall without reversibility.  This likely represents diaphragmatic attenuation; however, a small area of distal inferior infarct cannot be fully occluded.  Calculated ejection fraction is 55% with normal wall motion."  EKG 02/22/19: Atrial fibrillation. Vent rate 70.   Wynonia Musty [facility] Short Stay Center/Anesthesiology Phone [phone] 02/25/2019 9:38 AM

## 2019-02-26 ENCOUNTER — Ambulatory Visit (HOSPITAL_COMMUNITY): Payer: Medicare Other

## 2019-02-26 ENCOUNTER — Ambulatory Visit (HOSPITAL_COMMUNITY): Payer: Medicare Other | Admitting: Physician Assistant

## 2019-02-26 ENCOUNTER — Ambulatory Visit (HOSPITAL_COMMUNITY): Payer: Medicare Other | Admitting: Certified Registered Nurse Anesthetist

## 2019-02-26 ENCOUNTER — Encounter (HOSPITAL_COMMUNITY): Payer: Self-pay | Admitting: Certified Registered Nurse Anesthetist

## 2019-02-26 ENCOUNTER — Encounter (HOSPITAL_COMMUNITY)
Admission: AD | Disposition: A | Discharge status: Home / Self Care | Payer: Self-pay | Source: Home / Self Care | Attending: Neurosurgery

## 2019-02-26 ENCOUNTER — Other Ambulatory Visit: Payer: Self-pay

## 2019-02-26 ENCOUNTER — Observation Stay (HOSPITAL_COMMUNITY)
Admission: AD | Admit: 2019-02-26 | Discharge: 2019-02-27 | Disposition: A | Discharge status: Home / Self Care | Payer: Medicare Other | Attending: Neurosurgery | Admitting: Neurosurgery

## 2019-02-26 DIAGNOSIS — K219 Gastro-esophageal reflux disease without esophagitis: Secondary | ICD-10-CM | POA: Insufficient documentation

## 2019-02-26 DIAGNOSIS — Z419 Encounter for procedure for purposes other than remedying health state, unspecified: Secondary | ICD-10-CM

## 2019-02-26 DIAGNOSIS — Z79899 Other long term (current) drug therapy: Secondary | ICD-10-CM | POA: Insufficient documentation

## 2019-02-26 DIAGNOSIS — M960 Pseudarthrosis after fusion or arthrodesis: Secondary | ICD-10-CM | POA: Diagnosis not present

## 2019-02-26 DIAGNOSIS — Y838 Other surgical procedures as the cause of abnormal reaction of the patient, or of later complication, without mention of misadventure at the time of the procedure: Secondary | ICD-10-CM | POA: Diagnosis not present

## 2019-02-26 DIAGNOSIS — M5116 Intervertebral disc disorders with radiculopathy, lumbar region: Secondary | ICD-10-CM | POA: Diagnosis not present

## 2019-02-26 DIAGNOSIS — I10 Essential (primary) hypertension: Secondary | ICD-10-CM | POA: Insufficient documentation

## 2019-02-26 DIAGNOSIS — I4891 Unspecified atrial fibrillation: Secondary | ICD-10-CM | POA: Diagnosis not present

## 2019-02-26 DIAGNOSIS — M5126 Other intervertebral disc displacement, lumbar region: Secondary | ICD-10-CM | POA: Diagnosis present

## 2019-02-26 DIAGNOSIS — Z981 Arthrodesis status: Secondary | ICD-10-CM | POA: Diagnosis not present

## 2019-02-26 DIAGNOSIS — G709 Myoneural disorder, unspecified: Secondary | ICD-10-CM | POA: Insufficient documentation

## 2019-02-26 DIAGNOSIS — M48062 Spinal stenosis, lumbar region with neurogenic claudication: Secondary | ICD-10-CM | POA: Diagnosis present

## 2019-02-26 DIAGNOSIS — Z87891 Personal history of nicotine dependence: Secondary | ICD-10-CM | POA: Diagnosis not present

## 2019-02-26 HISTORY — PX: LUMBAR LAMINECTOMY/DECOMPRESSION MICRODISCECTOMY: SHX5026

## 2019-02-26 LAB — PROTIME-INR
INR: 1 (ref 0.8–1.2)
Prothrombin Time: 12.7 seconds (ref 11.4–15.2)

## 2019-02-26 SURGERY — LUMBAR LAMINECTOMY/DECOMPRESSION MICRODISCECTOMY 1 LEVEL
Anesthesia: General | Site: Spine Lumbar | Laterality: Bilateral

## 2019-02-26 MED ORDER — ONDANSETRON HCL 4 MG/2ML IJ SOLN: 4.0000 mg | Freq: Four times a day (QID) | INTRAMUSCULAR | Status: DC | PRN

## 2019-02-26 MED ORDER — HEMOSTATIC AGENTS (NO CHARGE) OPTIME
TOPICAL | Status: DC | PRN
  Administered 2019-02-26: 1 via TOPICAL

## 2019-02-26 MED ORDER — DEXAMETHASONE SODIUM PHOSPHATE 10 MG/ML IJ SOLN
INTRAMUSCULAR | Status: AC
  Filled 2019-02-26: qty 1

## 2019-02-26 MED ORDER — CEFAZOLIN SODIUM-DEXTROSE 2-4 GM/100ML-% IV SOLN
2.0000 g | Freq: Three times a day (TID) | INTRAVENOUS | Status: AC
  Administered 2019-02-26 – 2019-02-27 (×2): 2 g via INTRAVENOUS
  Filled 2019-02-26 (×2): qty 100

## 2019-02-26 MED ORDER — LIDOCAINE 2% (20 MG/ML) 5 ML SYRINGE
INTRAMUSCULAR | Status: DC | PRN
  Administered 2019-02-26: 100 mg via INTRAVENOUS

## 2019-02-26 MED ORDER — ACETAMINOPHEN 325 MG PO TABS: 650.0000 mg | ORAL_TABLET | ORAL | Status: DC | PRN

## 2019-02-26 MED ORDER — GABAPENTIN 800 MG PO TABS
800.0000 mg | ORAL_TABLET | Freq: Two times a day (BID) | ORAL | Status: DC
  Filled 2019-02-26: qty 1

## 2019-02-26 MED ORDER — LIDOCAINE-EPINEPHRINE 1 %-1:100000 IJ SOLN
INTRAMUSCULAR | Status: AC
  Filled 2019-02-26: qty 1

## 2019-02-26 MED ORDER — FENTANYL CITRATE (PF) 100 MCG/2ML IJ SOLN
INTRAMUSCULAR | Status: AC
  Filled 2019-02-26: qty 2

## 2019-02-26 MED ORDER — FENTANYL CITRATE (PF) 250 MCG/5ML IJ SOLN
INTRAMUSCULAR | Status: AC
  Filled 2019-02-26: qty 5

## 2019-02-26 MED ORDER — 0.9 % SODIUM CHLORIDE (POUR BTL) OPTIME
TOPICAL | Status: DC | PRN
  Administered 2019-02-26 (×2): 1000 mL

## 2019-02-26 MED ORDER — THROMBIN 5000 UNITS EX SOLR
OROMUCOSAL | Status: DC | PRN
  Administered 2019-02-26: 15:00:00 5 mL via TOPICAL

## 2019-02-26 MED ORDER — SUCCINYLCHOLINE CHLORIDE 200 MG/10ML IV SOSY
PREFILLED_SYRINGE | INTRAVENOUS | Status: AC
  Filled 2019-02-26: qty 10

## 2019-02-26 MED ORDER — GABAPENTIN 400 MG PO CAPS
800.0000 mg | ORAL_CAPSULE | Freq: Two times a day (BID) | ORAL | Status: DC
  Administered 2019-02-26 – 2019-02-27 (×2): 800 mg via ORAL
  Filled 2019-02-26 (×2): qty 2

## 2019-02-26 MED ORDER — AMLODIPINE BESY-BENAZEPRIL HCL 5-10 MG PO CAPS: 1.0000 | ORAL_CAPSULE | Freq: Every day | ORAL | Status: DC

## 2019-02-26 MED ORDER — FENTANYL CITRATE (PF) 100 MCG/2ML IJ SOLN
25.0000 ug | INTRAMUSCULAR | Status: DC | PRN
  Administered 2019-02-26 (×2): 50 ug via INTRAVENOUS

## 2019-02-26 MED ORDER — LACTATED RINGERS IV SOLN
INTRAVENOUS | Status: DC | PRN
  Administered 2019-02-26 (×2): via INTRAVENOUS

## 2019-02-26 MED ORDER — METHOCARBAMOL 500 MG PO TABS
500.0000 mg | ORAL_TABLET | Freq: Four times a day (QID) | ORAL | Status: DC | PRN
  Administered 2019-02-27: 04:00:00 500 mg via ORAL
  Filled 2019-02-26: qty 1

## 2019-02-26 MED ORDER — ROCURONIUM BROMIDE 10 MG/ML (PF) SYRINGE
PREFILLED_SYRINGE | INTRAVENOUS | Status: DC | PRN
  Administered 2019-02-26: 65 mg via INTRAVENOUS
  Administered 2019-02-26: 25 mg via INTRAVENOUS

## 2019-02-26 MED ORDER — OXYCODONE HCL 5 MG PO TABS
ORAL_TABLET | ORAL | Status: AC
  Administered 2019-02-26: 19:00:00 5 mg via ORAL
  Filled 2019-02-26: qty 1

## 2019-02-26 MED ORDER — ONDANSETRON HCL 4 MG/2ML IJ SOLN
INTRAMUSCULAR | Status: AC
  Filled 2019-02-26: qty 2

## 2019-02-26 MED ORDER — KCL IN DEXTROSE-NACL 20-5-0.45 MEQ/L-%-% IV SOLN: INTRAVENOUS | Status: DC

## 2019-02-26 MED ORDER — DOCUSATE SODIUM 100 MG PO CAPS
100.0000 mg | ORAL_CAPSULE | Freq: Two times a day (BID) | ORAL | Status: DC
  Administered 2019-02-26 – 2019-02-27 (×2): 100 mg via ORAL
  Filled 2019-02-26 (×2): qty 1

## 2019-02-26 MED ORDER — OXYCODONE HCL 5 MG/5ML PO SOLN: 5.0000 mg | Freq: Once | ORAL | Status: AC | PRN

## 2019-02-26 MED ORDER — MENTHOL 3 MG MT LOZG: 1.0000 | LOZENGE | OROMUCOSAL | Status: DC | PRN

## 2019-02-26 MED ORDER — DIPHENHYDRAMINE HCL 25 MG PO CAPS
50.0000 mg | ORAL_CAPSULE | Freq: Every day | ORAL | Status: DC
  Administered 2019-02-26: 50 mg via ORAL
  Filled 2019-02-26: qty 2

## 2019-02-26 MED ORDER — SODIUM CHLORIDE 0.9% FLUSH
3.0000 mL | Freq: Two times a day (BID) | INTRAVENOUS | Status: DC
  Administered 2019-02-27: 3 mL via INTRAVENOUS

## 2019-02-26 MED ORDER — SIMVASTATIN 20 MG PO TABS
40.0000 mg | ORAL_TABLET | Freq: Every day | ORAL | Status: DC
  Administered 2019-02-27: 40 mg via ORAL
  Filled 2019-02-26: qty 2

## 2019-02-26 MED ORDER — SODIUM CHLORIDE 0.9 % IV SOLN
INTRAVENOUS | Status: DC | PRN
  Administered 2019-02-26: 30 ug/min via INTRAVENOUS

## 2019-02-26 MED ORDER — PANTOPRAZOLE SODIUM 40 MG IV SOLR
40.0000 mg | Freq: Every day | INTRAVENOUS | Status: DC
  Administered 2019-02-26: 21:00:00 40 mg via INTRAVENOUS
  Filled 2019-02-26: qty 40

## 2019-02-26 MED ORDER — METHOCARBAMOL 1000 MG/10ML IJ SOLN
500.0000 mg | Freq: Four times a day (QID) | INTRAVENOUS | Status: DC | PRN
  Filled 2019-02-26: qty 5

## 2019-02-26 MED ORDER — DIPHENHYDRAMINE-APAP (SLEEP) 25-500 MG PO TABS: 2.0000 | ORAL_TABLET | Freq: Every day | ORAL | Status: DC

## 2019-02-26 MED ORDER — PANTOPRAZOLE SODIUM 20 MG PO TBEC: 20.0000 mg | DELAYED_RELEASE_TABLET | Freq: Every day | ORAL | Status: DC

## 2019-02-26 MED ORDER — DEXAMETHASONE SODIUM PHOSPHATE 10 MG/ML IJ SOLN
INTRAMUSCULAR | Status: DC | PRN
  Administered 2019-02-26: 10 mg via INTRAVENOUS

## 2019-02-26 MED ORDER — CHLORHEXIDINE GLUCONATE CLOTH 2 % EX PADS: 6.0000 | MEDICATED_PAD | Freq: Once | CUTANEOUS | Status: DC

## 2019-02-26 MED ORDER — GUAIFENESIN ER 600 MG PO TB12
600.0000 mg | ORAL_TABLET | Freq: Two times a day (BID) | ORAL | Status: DC | PRN
  Administered 2019-02-26: 600 mg via ORAL
  Filled 2019-02-26 (×2): qty 1

## 2019-02-26 MED ORDER — CEFAZOLIN SODIUM-DEXTROSE 2-4 GM/100ML-% IV SOLN
INTRAVENOUS | Status: AC
  Filled 2019-02-26: qty 100

## 2019-02-26 MED ORDER — ZOLPIDEM TARTRATE 5 MG PO TABS: 5.0000 mg | ORAL_TABLET | Freq: Every evening | ORAL | Status: DC | PRN

## 2019-02-26 MED ORDER — PROPOFOL 10 MG/ML IV BOLUS
INTRAVENOUS | Status: DC | PRN
  Administered 2019-02-26: 140 mg via INTRAVENOUS

## 2019-02-26 MED ORDER — HYDROCODONE-ACETAMINOPHEN 10-325 MG PO TABS
1.0000 | ORAL_TABLET | Freq: Four times a day (QID) | ORAL | Status: DC | PRN
  Administered 2019-02-26 – 2019-02-27 (×3): 1 via ORAL
  Filled 2019-02-26 (×3): qty 1

## 2019-02-26 MED ORDER — SODIUM CHLORIDE 0.9% FLUSH: 3.0000 mL | INTRAVENOUS | Status: DC | PRN

## 2019-02-26 MED ORDER — LIDOCAINE-EPINEPHRINE 1 %-1:100000 IJ SOLN
INTRAMUSCULAR | Status: DC | PRN
  Administered 2019-02-26: 4.5 mL

## 2019-02-26 MED ORDER — PREDNISONE 5 MG PO TABS
5.0000 mg | ORAL_TABLET | Freq: Every day | ORAL | Status: DC
  Administered 2019-02-27: 5 mg via ORAL
  Filled 2019-02-26: qty 1

## 2019-02-26 MED ORDER — ACETAMINOPHEN 650 MG RE SUPP: 650.0000 mg | RECTAL | Status: DC | PRN

## 2019-02-26 MED ORDER — SALINE SPRAY 0.65 % NA SOLN
1.0000 | Freq: Every evening | NASAL | Status: DC | PRN
  Filled 2019-02-26: qty 44

## 2019-02-26 MED ORDER — LIDOCAINE 2% (20 MG/ML) 5 ML SYRINGE
INTRAMUSCULAR | Status: AC
  Filled 2019-02-26: qty 5

## 2019-02-26 MED ORDER — OXYCODONE HCL 5 MG PO TABS: 5.0000 mg | ORAL_TABLET | ORAL | Status: DC | PRN

## 2019-02-26 MED ORDER — THROMBIN 5000 UNITS EX SOLR
CUTANEOUS | Status: AC
  Filled 2019-02-26: qty 5000

## 2019-02-26 MED ORDER — POLYETHYLENE GLYCOL 3350 17 G PO PACK: 17.0000 g | PACK | Freq: Every day | ORAL | Status: DC | PRN

## 2019-02-26 MED ORDER — SODIUM CHLORIDE 0.9 % IV SOLN: 250.0000 mL | INTRAVENOUS | Status: DC

## 2019-02-26 MED ORDER — OXYCODONE HCL 5 MG PO TABS
5.0000 mg | ORAL_TABLET | Freq: Once | ORAL | Status: AC | PRN
  Administered 2019-02-26: 19:00:00 5 mg via ORAL

## 2019-02-26 MED ORDER — PHENYLEPHRINE 40 MCG/ML (10ML) SYRINGE FOR IV PUSH (FOR BLOOD PRESSURE SUPPORT)
PREFILLED_SYRINGE | INTRAVENOUS | Status: AC
  Filled 2019-02-26: qty 20

## 2019-02-26 MED ORDER — ONDANSETRON HCL 4 MG PO TABS: 4.0000 mg | ORAL_TABLET | Freq: Four times a day (QID) | ORAL | Status: DC | PRN

## 2019-02-26 MED ORDER — TIZANIDINE HCL 4 MG PO TABS
4.0000 mg | ORAL_TABLET | Freq: Every day | ORAL | Status: DC
  Administered 2019-02-26: 21:00:00 4 mg via ORAL
  Filled 2019-02-26: qty 1

## 2019-02-26 MED ORDER — AMLODIPINE BESYLATE 5 MG PO TABS
5.0000 mg | ORAL_TABLET | Freq: Every day | ORAL | Status: DC
  Administered 2019-02-27: 5 mg via ORAL
  Filled 2019-02-26: qty 1

## 2019-02-26 MED ORDER — FENTANYL CITRATE (PF) 100 MCG/2ML IJ SOLN
INTRAMUSCULAR | Status: AC
  Administered 2019-02-26: 50 ug via INTRAVENOUS
  Filled 2019-02-26: qty 2

## 2019-02-26 MED ORDER — BUPIVACAINE HCL (PF) 0.5 % IJ SOLN
INTRAMUSCULAR | Status: AC
  Filled 2019-02-26: qty 30

## 2019-02-26 MED ORDER — CEFAZOLIN SODIUM-DEXTROSE 2-4 GM/100ML-% IV SOLN
2.0000 g | INTRAVENOUS | Status: AC
  Administered 2019-02-26: 2 g via INTRAVENOUS

## 2019-02-26 MED ORDER — BENAZEPRIL HCL 10 MG PO TABS
10.0000 mg | ORAL_TABLET | Freq: Every day | ORAL | Status: DC
  Administered 2019-02-27: 09:00:00 10 mg via ORAL
  Filled 2019-02-26: qty 1

## 2019-02-26 MED ORDER — TAMSULOSIN HCL 0.4 MG PO CAPS
0.4000 mg | ORAL_CAPSULE | Freq: Every day | ORAL | Status: DC
  Administered 2019-02-27: 0.4 mg via ORAL
  Filled 2019-02-26: qty 1

## 2019-02-26 MED ORDER — LACTATED RINGERS IV SOLN
INTRAVENOUS | Status: DC
  Administered 2019-02-26: 12:00:00 via INTRAVENOUS

## 2019-02-26 MED ORDER — FENTANYL CITRATE (PF) 250 MCG/5ML IJ SOLN
INTRAMUSCULAR | Status: DC | PRN
  Administered 2019-02-26 (×2): 50 ug via INTRAVENOUS

## 2019-02-26 MED ORDER — ALBUMIN HUMAN 5 % IV SOLN
INTRAVENOUS | Status: DC | PRN
  Administered 2019-02-26: 15:00:00 via INTRAVENOUS

## 2019-02-26 MED ORDER — ACETAMINOPHEN 500 MG PO TABS
1000.0000 mg | ORAL_TABLET | Freq: Every day | ORAL | Status: DC
  Administered 2019-02-26: 1000 mg via ORAL
  Filled 2019-02-26: qty 2

## 2019-02-26 MED ORDER — ROCURONIUM BROMIDE 10 MG/ML (PF) SYRINGE
PREFILLED_SYRINGE | INTRAVENOUS | Status: AC
  Filled 2019-02-26: qty 10

## 2019-02-26 MED ORDER — ONDANSETRON HCL 4 MG/2ML IJ SOLN
INTRAMUSCULAR | Status: DC | PRN
  Administered 2019-02-26: 4 mg via INTRAVENOUS

## 2019-02-26 MED ORDER — FLEET ENEMA 7-19 GM/118ML RE ENEM: 1.0000 | ENEMA | Freq: Once | RECTAL | Status: DC | PRN

## 2019-02-26 MED ORDER — BISACODYL 10 MG RE SUPP: 10.0000 mg | Freq: Every day | RECTAL | Status: DC | PRN

## 2019-02-26 MED ORDER — METHYLPREDNISOLONE ACETATE 80 MG/ML IJ SUSP
INTRAMUSCULAR | Status: AC
  Filled 2019-02-26: qty 1

## 2019-02-26 MED ORDER — MORPHINE SULFATE (PF) 2 MG/ML IV SOLN: 2.0000 mg | INTRAVENOUS | Status: DC | PRN

## 2019-02-26 MED ORDER — VITAMIN D (ERGOCALCIFEROL) 1.25 MG (50000 UNIT) PO CAPS: 50000.0000 [IU] | ORAL_CAPSULE | ORAL | Status: DC

## 2019-02-26 MED ORDER — SUGAMMADEX SODIUM 200 MG/2ML IV SOLN
INTRAVENOUS | Status: DC | PRN
  Administered 2019-02-26: 160 mg via INTRAVENOUS

## 2019-02-26 MED ORDER — METOPROLOL SUCCINATE ER 25 MG PO TB24
25.0000 mg | ORAL_TABLET | Freq: Every day | ORAL | Status: DC
  Administered 2019-02-27: 25 mg via ORAL
  Filled 2019-02-26: qty 1

## 2019-02-26 MED ORDER — PHENYLEPHRINE 40 MCG/ML (10ML) SYRINGE FOR IV PUSH (FOR BLOOD PRESSURE SUPPORT)
PREFILLED_SYRINGE | INTRAVENOUS | Status: DC | PRN
  Administered 2019-02-26: 40 ug via INTRAVENOUS
  Administered 2019-02-26 (×2): 120 ug via INTRAVENOUS

## 2019-02-26 MED ORDER — HYDROCODONE-ACETAMINOPHEN 5-325 MG PO TABS: 2.0000 | ORAL_TABLET | ORAL | Status: DC | PRN

## 2019-02-26 MED ORDER — ALUM & MAG HYDROXIDE-SIMETH 200-200-20 MG/5ML PO SUSP: 30.0000 mL | Freq: Four times a day (QID) | ORAL | Status: DC | PRN

## 2019-02-26 MED ORDER — PROPOFOL 10 MG/ML IV BOLUS
INTRAVENOUS | Status: AC
  Filled 2019-02-26: qty 20

## 2019-02-26 MED ORDER — BUPIVACAINE HCL (PF) 0.5 % IJ SOLN
INTRAMUSCULAR | Status: DC | PRN
  Administered 2019-02-26: 4.5 mL

## 2019-02-26 MED ORDER — PHENOL 1.4 % MT LIQD: 1.0000 | OROMUCOSAL | Status: DC | PRN

## 2019-02-26 SURGICAL SUPPLY — 75 items
BONE CANC CHIPS 20CC PCAN1/4 (Bone Implant) ×3 IMPLANT
BUR MATCHSTICK NEURO 3.0 LAGG (BURR) ×3 IMPLANT
BUR ROUND FLUTED 5 RND (BURR) ×2 IMPLANT
BUR ROUND FLUTED 5MM RND (BURR) ×1
CANISTER SUCT 3000ML PPV (MISCELLANEOUS) ×3 IMPLANT
CARTRIDGE OIL MAESTRO DRILL (MISCELLANEOUS) ×1 IMPLANT
CEMENT KYPHON C01A KIT/MIXER (Cement) ×2 IMPLANT
CHIPS CANC BONE 20CC PCAN1/4 (Bone Implant) ×1 IMPLANT
DECANTER SPIKE VIAL GLASS SM (MISCELLANEOUS) ×3 IMPLANT
DERMABOND ADVANCED (GAUZE/BANDAGES/DRESSINGS) ×2
DERMABOND ADVANCED .7 DNX12 (GAUZE/BANDAGES/DRESSINGS) ×1 IMPLANT
DIFFUSER DRILL AIR PNEUMATIC (MISCELLANEOUS) ×3 IMPLANT
DRAPE C-ARM 42X72 X-RAY (DRAPES) ×4 IMPLANT
DRAPE C-ARMOR (DRAPES) ×2 IMPLANT
DRAPE LAPAROTOMY 100X72X124 (DRAPES) ×3 IMPLANT
DRAPE MICROSCOPE LEICA (MISCELLANEOUS) ×3 IMPLANT
DRAPE SURG 17X23 STRL (DRAPES) ×3 IMPLANT
DRAPE WARM FLUID 44X44 (DRAPES) ×2 IMPLANT
DRSG OPSITE POSTOP 4X8 (GAUZE/BANDAGES/DRESSINGS) ×2 IMPLANT
DURAPREP 26ML APPLICATOR (WOUND CARE) ×3 IMPLANT
ELECT REM PT RETURN 9FT ADLT (ELECTROSURGICAL) ×3
ELECTRODE REM PT RTRN 9FT ADLT (ELECTROSURGICAL) ×1 IMPLANT
GAUZE 4X4 16PLY RFD (DISPOSABLE) IMPLANT
GAUZE SPONGE 4X4 12PLY STRL (GAUZE/BANDAGES/DRESSINGS) IMPLANT
GLOVE BIO SURGEON STRL SZ8 (GLOVE) ×3 IMPLANT
GLOVE BIOGEL PI IND STRL 6.5 (GLOVE) IMPLANT
GLOVE BIOGEL PI IND STRL 7.0 (GLOVE) IMPLANT
GLOVE BIOGEL PI IND STRL 7.5 (GLOVE) IMPLANT
GLOVE BIOGEL PI IND STRL 8 (GLOVE) ×1 IMPLANT
GLOVE BIOGEL PI IND STRL 8.5 (GLOVE) ×1 IMPLANT
GLOVE BIOGEL PI INDICATOR 6.5 (GLOVE) ×2
GLOVE BIOGEL PI INDICATOR 7.0 (GLOVE) ×2
GLOVE BIOGEL PI INDICATOR 7.5 (GLOVE) ×2
GLOVE BIOGEL PI INDICATOR 8 (GLOVE) ×2
GLOVE BIOGEL PI INDICATOR 8.5 (GLOVE) ×2
GLOVE ECLIPSE 8.0 STRL XLNG CF (GLOVE) ×3 IMPLANT
GLOVE EXAM NITRILE XL STR (GLOVE) IMPLANT
GLOVE SURG SS PI 7.0 STRL IVOR (GLOVE) ×6 IMPLANT
GOWN STRL REUS W/ TWL LRG LVL3 (GOWN DISPOSABLE) IMPLANT
GOWN STRL REUS W/ TWL XL LVL3 (GOWN DISPOSABLE) ×1 IMPLANT
GOWN STRL REUS W/TWL 2XL LVL3 (GOWN DISPOSABLE) ×3 IMPLANT
GOWN STRL REUS W/TWL LRG LVL3 (GOWN DISPOSABLE) ×2
GOWN STRL REUS W/TWL XL LVL3 (GOWN DISPOSABLE) ×4
GRAFT BNE CANC CHIPS 1-8 20CC (Bone Implant) IMPLANT
HEMOSTAT POWDER KIT SURGIFOAM (HEMOSTASIS) ×3 IMPLANT
KIT BASIN OR (CUSTOM PROCEDURE TRAY) ×3 IMPLANT
KIT TURNOVER KIT B (KITS) ×3 IMPLANT
NDL HYPO 18GX1.5 BLUNT FILL (NEEDLE) IMPLANT
NDL HYPO 25X1 1.5 SAFETY (NEEDLE) ×1 IMPLANT
NDL RELINE-OR FENS 16 (NEEDLE) IMPLANT
NDL SPNL 18GX3.5 QUINCKE PK (NEEDLE) ×1 IMPLANT
NEEDLE HYPO 18GX1.5 BLUNT FILL (NEEDLE) IMPLANT
NEEDLE HYPO 25X1 1.5 SAFETY (NEEDLE) ×3 IMPLANT
NEEDLE RELINE-OR FENS 16 (NEEDLE) ×18 IMPLANT
NEEDLE SPNL 18GX3.5 QUINCKE PK (NEEDLE) ×3 IMPLANT
NS IRRIG 1000ML POUR BTL (IV SOLUTION) ×5 IMPLANT
OIL CARTRIDGE MAESTRO DRILL (MISCELLANEOUS) ×3
PACK LAMINECTOMY NEURO (CUSTOM PROCEDURE TRAY) ×3 IMPLANT
PAD ARMBOARD 7.5X6 YLW CONV (MISCELLANEOUS) ×9 IMPLANT
PUSHER RELINE FENS 36 OR (MISCELLANEOUS) ×10 IMPLANT
RUBBERBAND STERILE (MISCELLANEOUS) ×6 IMPLANT
SCREW LOCK RELINE 5.5 TULIP (Screw) ×8 IMPLANT
SCREW PA RELINE 7.5X50 (Screw) IMPLANT
SCREW PA RELINE 8.5X50 (Screw) ×4 IMPLANT
SEALANT ADHERUS EXTEND TIP (MISCELLANEOUS) ×2 IMPLANT
SPONGE SURGIFOAM ABS GEL SZ50 (HEMOSTASIS) IMPLANT
SUT VIC AB 0 CT1 18XCR BRD8 (SUTURE) ×1 IMPLANT
SUT VIC AB 0 CT1 8-18 (SUTURE) ×2
SUT VIC AB 2-0 CT1 18 (SUTURE) ×3 IMPLANT
SUT VIC AB 3-0 SH 8-18 (SUTURE) ×3 IMPLANT
SYR 5ML LL (SYRINGE) IMPLANT
TIP FENS REPLACE RELINE (MISCELLANEOUS) ×6 IMPLANT
TOWEL GREEN STERILE (TOWEL DISPOSABLE) ×3 IMPLANT
TOWEL GREEN STERILE FF (TOWEL DISPOSABLE) ×3 IMPLANT
WATER STERILE IRR 1000ML POUR (IV SOLUTION) ×3 IMPLANT

## 2019-02-26 NOTE — Transfer of Care (Signed)
Immediate Anesthesia Transfer of Care Note  Patient: Kevin Carlson.  Procedure(s) Performed: REDO LUMBAR LAMINECTOMY AND DISCECTOMY BILATERALLY LUMBAR TWO- LUMBAR THREE (Bilateral Spine Cervical)  Patient Location: PACU  Anesthesia Type:General  Level of Consciousness: drowsy  Airway & Oxygen Therapy: Patient Spontanous Breathing  Post-op Assessment: Report given to RN and Patient moving all extremities X 4  Post vital signs: Reviewed and stable  Last Vitals:  Vitals Value Taken Time  BP    Temp    Pulse 76 02/26/19 1746  Resp 13 02/26/19 1746  SpO2 99 % 02/26/19 1746  Vitals shown include unvalidated device data.  Last Pain:  Vitals:   02/26/19 1136  TempSrc:   PainSc: 5          Complications: No apparent anesthesia complications

## 2019-02-26 NOTE — Brief Op Note (Signed)
02/26/2019  5:53 PM  PATIENT:  Kevin Carlson.  85 y.o. male  PRE-OPERATIVE DIAGNOSIS:  SPINAL STENOSIS, LUMBAR REGION WITH NEUROGENIC CLAUDICATION, herniated lumbar disc, lumbago, radiculopathy  POST-OPERATIVE DIAGNOSIS:  SPINAL STENOSIS, LUMBAR REGION WITH NEUROGENIC CLAUDICATION, herniated lumbar disc, lumbago, radiculopathy, pseudoarthrosis lumbar spine  PROCEDURE:  Procedure(s): REDO LUMBAR LAMINECTOMY AND DISCECTOMY BILATERALLY LUMBAR TWO- LUMBAR THREE Right with exploration of fusion L 23 with revision of fusion L 23 with posterolateral arthrodesis  SURGEON:  Surgeon(s) and Role:    Erline Levine, MD - Primary    * Judith Part, MD - Assisting  PHYSICIAN ASSISTANT:   ASSISTANTS: Poteat, RN   ANESTHESIA:   general  EBL:  250 mL   BLOOD ADMINISTERED:none  DRAINS: none   LOCAL MEDICATIONS USED:  MARCAINE    and LIDOCAINE   SPECIMEN:  No Specimen  DISPOSITION OF SPECIMEN:  N/A  COUNTS:  YES  TOURNIQUET:  * No tourniquets in log *  DICTATION: Patient is 85 year old man with lumbar scoliosis and previous fusion L 2-5 level with recurrent stenosis at  L 23 and disc herniation with severe back and right greater than left leg pain.  It was elected to take him to surgery for exploration of previous fusion with redo decompression at the L 23 level.   Procedure: Patient was placed in a prone position on the OR table after smooth and uncomplicated induction of general endotracheal anesthesia. His low back was prepped and draped in usual sterile fashion with betadine scrub and DuraPrep after localizing X ray was performed. Area of incision was infiltrated with local lidocaine. Incision was made to the lumbodorsal fascia was incised and exposure was performed of the L 2 - L 3 hardware and connecting rods.  The L 2 screws were found to be loose.  The connecting rods were removed and the screws at L 2 were removed.  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 We then  performed a redo laminectomy at L 23 on the right with a lateral approach through the previously operated level with removal of SAP and entering the disc space from a lateral to medial approach.  This was done with the microscope.  The disc was thoroughly removed and multiple large fragments of disc material were removed, along with decompression of the epidural space with removal of multiple fragments of disc material.   I elected not to place an interbody graft at this level due to extensive scarring and poor bone quality.  A small amount of CSF was identified emanating from the scar over the dorsal aspect of the redo decompression.  This was later covered with Dural sealant.   The posterolateral region was decorticated on the left and pedicle screws were placed at L 2 bilaterally (8.5 x 50 mm).  These were fenestrated.  We filled the fenestrated screws with bone cement for more secure purchase.  The previous connectors were then repositioned and locked down in situ.  The posterolateral region was packed with 20 cc bone allograft on the left. Fascia was closed with 1 Vicryl sutures skin edges were reapproximated 2 and 3-0 Vicryl sutures. The wound was dressed with Dermabond and  an occlusive dressing the patient was extubated in the operating room and taken to recovery in stable satisfactory condition she tolerated traction well counts were correct at the end of the case.   PLAN OF CARE: Admit to inpatient   PATIENT DISPOSITION:  PACU - hemodynamically stable.  Delay start of Pharmacological VTE agent (>24hrs) due to surgical blood loss or risk of bleeding: yes

## 2019-02-26 NOTE — Progress Notes (Signed)
Awake, alert, conversant.  MAEW with good strength.  Patient is doing well.   

## 2019-02-26 NOTE — Interval H&P Note (Signed)
History and Physical Interval Note:  02/26/2019 2:03 PM  Kevin Holms.  has presented today for surgery, with the diagnosis of SPINAL STENOSIS, [place].  The various methods of treatment have been discussed with the patient and family. After consideration of risks, benefits and other options for treatment, the patient has consented to  Procedure(s): REDO LUMBAR LAMINECTOMY AND DISCECTOMY BILATERALLY LUMBAR TWO- LUMBAR THREE (Bilateral) as a surgical intervention.  The patient's history has been reviewed, patient examined, no change in status, stable for surgery.  I have reviewed the patient's chart and labs.  Questions were answered to the patient's satisfaction.     [name]

## 2019-02-26 NOTE — Progress Notes (Signed)
Orthopedic Tech Progress Note Patient Details:  Kevin Carlson Sep 27, 1929 [id number] rn stated patient has a brace at home. Patient ID: [name]., male   DOB: 1930-04-21, 85 y.o.   MRN: [id number]   Braulio Bosch 02/26/2019, 7:58 PM

## 2019-02-26 NOTE — Anesthesia Procedure Notes (Signed)
Procedure Name: Intubation Date/Time: 02/26/2019 2:33 PM Performed by: Harden Mo, CRNA Pre-anesthesia Checklist: Patient identified, Emergency Drugs available, Suction available and Patient being monitored Patient Re-evaluated:Patient Re-evaluated prior to induction Oxygen Delivery Method: Circle System Utilized Preoxygenation: Pre-oxygenation with 100% oxygen Induction Type: IV induction Ventilation: Mask ventilation without difficulty Laryngoscope Size: Miller and 2 Grade View: Grade I Tube type: Oral Tube size: 7.5 mm Number of attempts: 1 Airway Equipment and Method: Stylet and Oral airway Placement Confirmation: ETT inserted through vocal cords under direct vision,  positive ETCO2 and breath sounds checked- equal and bilateral Secured at: 22 cm Tube secured with: Tape Dental Injury: Teeth and Oropharynx as per pre-operative assessment

## 2019-02-26 NOTE — Op Note (Signed)
02/26/2019  5:53 PM  PATIENT:  Kevin Carlson.  85 y.o. male  PRE-OPERATIVE DIAGNOSIS:  SPINAL STENOSIS, LUMBAR REGION WITH NEUROGENIC CLAUDICATION, herniated lumbar disc, lumbago, radiculopathy  POST-OPERATIVE DIAGNOSIS:  SPINAL STENOSIS, LUMBAR REGION WITH NEUROGENIC CLAUDICATION, herniated lumbar disc, lumbago, radiculopathy, pseudoarthrosis lumbar spine  PROCEDURE:  Procedure(s): REDO LUMBAR LAMINECTOMY AND DISCECTOMY BILATERALLY LUMBAR TWO- LUMBAR THREE Right with exploration of fusion L 23 with revision of fusion L 23 with posterolateral arthrodesis  SURGEON:  Surgeon(s) and Role:    Erline Levine, MD - Primary    * Judith Part, MD - Assisting  PHYSICIAN ASSISTANT:   ASSISTANTS: Poteat, RN   ANESTHESIA:   general  EBL:  250 mL   BLOOD ADMINISTERED:none  DRAINS: none   LOCAL MEDICATIONS USED:  MARCAINE    and LIDOCAINE   SPECIMEN:  No Specimen  DISPOSITION OF SPECIMEN:  N/A  COUNTS:  YES  TOURNIQUET:  * No tourniquets in log *  DICTATION: Patient is 85 year old man with lumbar scoliosis and previous fusion L 2-5 level with recurrent stenosis at  L 23 and disc herniation with severe back and right greater than left leg pain.  It was elected to take him to surgery for exploration of previous fusion with redo decompression at the L 23 level.   Procedure: Patient was placed in a prone position on the OR table after smooth and uncomplicated induction of general endotracheal anesthesia. His low back was prepped and draped in usual sterile fashion with betadine scrub and DuraPrep after localizing X ray was performed. Area of incision was infiltrated with local lidocaine. Incision was made to the lumbodorsal fascia was incised and exposure was performed of the L 2 - L 3 hardware and connecting rods.  The L 2 screws were found to be loose.  The connecting rods were removed and the screws at L 2 were removed.  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 We then  performed a redo laminectomy at L 23 on the right with a lateral approach through the previously operated level with removal of SAP and entering the disc space from a lateral to medial approach.  This was done with the microscope.  The disc was thoroughly removed and multiple large fragments of disc material were removed, along with decompression of the epidural space with removal of multiple fragments of disc material.   I elected not to place an interbody graft at this level due to extensive scarring and poor bone quality.  A small amount of CSF was identified emanating from the scar over the dorsal aspect of the redo decompression.  This was later covered with Dural sealant.   The posterolateral region was decorticated on the left and pedicle screws were placed at L 2 bilaterally (8.5 x 50 mm).  These were fenestrated.  We filled the fenestrated screws with bone cement for more secure purchase.  The previous connectors were then repositioned and locked down in situ.  The posterolateral region was packed with 20 cc bone allograft on the left. Fascia was closed with 1 Vicryl sutures skin edges were reapproximated 2 and 3-0 Vicryl sutures. The wound was dressed with Dermabond and  an occlusive dressing the patient was extubated in the operating room and taken to recovery in stable satisfactory condition she tolerated traction well counts were correct at the end of the case.   PLAN OF CARE: Admit to inpatient   PATIENT DISPOSITION:  PACU - hemodynamically stable.  Delay start of Pharmacological VTE agent (>24hrs) due to surgical blood loss or risk of bleeding: yes

## 2019-02-27 DIAGNOSIS — M48062 Spinal stenosis, lumbar region with neurogenic claudication: Secondary | ICD-10-CM | POA: Diagnosis not present

## 2019-02-27 MED ORDER — METHOCARBAMOL 500 MG PO TABS: 500.0000 mg | ORAL_TABLET | Freq: Four times a day (QID) | ORAL | 1 refills | Status: AC | PRN

## 2019-02-27 MED ORDER — HYDROCODONE-ACETAMINOPHEN 10-325 MG PO TABS: 1.0000 | ORAL_TABLET | Freq: Four times a day (QID) | ORAL | 0 refills | Status: AC | PRN

## 2019-02-27 NOTE — Care Management Obs Status (Signed)
[place] NOTIFICATION   Patient Details  Name: Kevin Carlson. MRN: [id number] Date of Birth: 05-24-1929   Medicare Observation Status Notification Given:  Yes    Benard Halsted, LCSW 02/27/2019, 9:29 AM

## 2019-02-27 NOTE — Progress Notes (Signed)
Occupational Therapy Evaluation Patient Details Name: Kevin Carlson. MRN: [id number] DOB: 1930/01/18 Today's Date: 02/27/2019    History of Present Illness Pt is 85 yo male s/p a redo of lumbar laminectomy and discectomy bilaterally L2-3. PMH including prior total laminactomy, cervical DDD, Bilateral TKA, afib, HTN, and neuropathy.    Clinical Impression   PTA, pt lived alone in one story home, and was independent for ADLs; reports that daughter provides assist for IADLs. Pt currently presents with decreased balance, strength, activity tolerance, knowledge of precautions, and increased pain. Provided education regarding compensatory strategies for ADLs. Pt performed grooming, LB dressing, and functional transfers with min guard A and max VCs to maintain precautions throughout. Pt would benefit from continued OT services acutely to educate on brace management and to facilitate safe dc. Recommend dc home with no OT follow up and 24/7 supervision once medically stable per MD.     Follow Up Recommendations  No OT follow up;Supervision/Assistance - 24 hour    Equipment Recommendations  None recommended by OT    Recommendations for Other Services PT consult     Precautions / Restrictions Precautions Precautions: Back;Fall Precaution Booklet Issued: No Precaution Comments: Pt recalled 3/3 precautions with increased time. pt required max VCs to maintain precautions during ADLs Required Braces or Orthoses: Spinal Brace Spinal Brace: Applied in sitting position;Other (comment)(Daughter is bringing brace from home) Spinal Brace Comments: Daughter is bringing brace from home Restrictions Weight Bearing Restrictions: No      Mobility Bed Mobility               General bed mobility comments: pt sitting EOB with PT upon arrival  Transfers Overall transfer level: Needs assistance Equipment used: Rolling walker (2 wheeled) Transfers: Sit to/from Stand Sit to Stand: Min guard          General transfer comment: pt required min guard A for safety and balance    Balance Overall balance assessment: Needs assistance Sitting-balance support: No upper extremity supported;Feet supported Sitting balance-Leahy Scale: Good     Standing balance support: No upper extremity supported;During functional activity Standing balance-Leahy Scale: Fair Standing balance comment: Pt able to maintain static standing for short period of time to perform grooming task at sink                           ADL either performed or assessed with clinical judgement   ADL Overall ADL's : Needs assistance/impaired Eating/Feeding: Independent;Sitting   Grooming: Oral care;Wash/dry face;Min guard;Cueing for compensatory techniques;Standing Grooming Details (indicate cue type and reason): Provided education for compensatory techniques for grooming tasks at sink. Pt did not maintain back precautions during oral care, and bent over to spit into sink. Required max VCs to follow compensatory techniques and maintain precautions Upper Body Bathing: Supervision/ safety;Sitting   Lower Body Bathing: Min guard;Sit to/from stand   Upper Body Dressing : Sitting;Min guard   Lower Body Dressing: Min guard;Sit to/from stand Lower Body Dressing Details (indicate cue type and reason): Provided education regarding figure 4 method for LB ADLs. Pt adjusted socks using figure 4 technique. Toilet Transfer: Min guard;Ambulation;RW(simulated to recliner) Toilet Transfer Details (indicate cue type and reason): Pt required min guard A for safety Toileting- Clothing Manipulation and Hygiene: Min guard;Sit to/from stand       Functional mobility during ADLs: Min guard;Rolling walker General ADL Comments: Provided education regarding compensatory techniques for ADLs. Pt required supervision for UB ADLs, min guard A  for LB ADLS and functional transfers. Required max VCs throughout to maintain back precautions      Vision Baseline Vision/History: Wears glasses Wears Glasses: At all times Vision Assessment?: No apparent visual deficits     Perception     Praxis      Pertinent Vitals/Pain Pain Assessment: Faces Faces Pain Scale: Hurts little more Pain Location: back Pain Descriptors / Indicators: Aching;Discomfort;Sore Pain Intervention(s): Monitored during session;Repositioned     Hand Dominance Right   Extremity/Trunk Assessment Upper Extremity Assessment Upper Extremity Assessment: Generalized weakness   Lower Extremity Assessment Lower Extremity Assessment: Defer to PT evaluation   Cervical / Trunk Assessment Cervical / Trunk Assessment: Other exceptions Cervical / Trunk Exceptions: s/p spinal surgery   Communication Communication Communication: No difficulties   Cognition Arousal/Alertness: Awake/alert Behavior During Therapy: WFL for tasks assessed/performed Overall Cognitive Status: Within Functional Limits for tasks assessed                                 General Comments: Pt demonstrated difficulty adhering to back precautions when performing ADLs. Pt appears determined to go home today, and believes that he does not require supervision when he is at home.    General Comments       Exercises     Shoulder Instructions      Home Living Family/patient expects to be discharged to:: Private residence Living Arrangements: Alone Available Help at Discharge: Family;Friend(s);Available PRN/intermittently Type of Home: House Home Access: Stairs to enter Entrance Stairs-Number of Steps: 1   Home Layout: One level;Other (Comment)(basement)     Bathroom Shower/Tub: Occupational psychologist: Handicapped height     Home Equipment: Environmental consultant - 2 wheels;Cane - single point;Shower seat   Additional Comments: pt reports not needed walker or shower seat      Prior Functioning/Environment Level of Independence: Independent        Comments:  Daughter lives within walking distance, someone stays with him during the day but he is alone at night. Pt reprots daughter provides assist for IADLs        OT Problem List: Pain;Decreased strength;Decreased range of motion;Decreased activity tolerance;Impaired balance (sitting and/or standing);Decreased safety awareness;Decreased knowledge of use of DME or AE;Decreased knowledge of precautions      OT Treatment/Interventions: Self-care/ADL training;Therapeutic activities;Patient/family education;Balance training    OT Goals(Current goals can be found in the care plan section) Acute Rehab OT Goals Patient Stated Goal: go home today OT Goal Formulation: With patient Time For Goal Achievement: 03/13/19 Potential to Achieve Goals: Good  OT Frequency: Min 3X/week   Barriers to D/C:            Co-evaluation              AM-PAC OT "6 Clicks" Daily Activity     Outcome Measure Help from another person eating meals?: None Help from another person taking care of personal grooming?: A Little Help from another person toileting, which includes using toliet, bedpan, or urinal?: A Little Help from another person bathing (including washing, rinsing, drying)?: A Little Help from another person to put on and taking off regular upper body clothing?: None Help from another person to put on and taking off regular lower body clothing?: A Little 6 Click Score: 20   End of Session Equipment Utilized During Treatment: Rolling walker Nurse Communication: Mobility status  Activity Tolerance: Patient tolerated treatment well Patient left: in chair;with call  bell/phone within reach  OT Visit Diagnosis: Unsteadiness on feet (R26.81);Other abnormalities of gait and mobility (R26.89);Muscle weakness (generalized) (M62.81);Pain Pain - part of body: (back)                Time: [id number] OT Time Calculation (min): 24 min Charges:  OT General Charges $OT Visit: 1 Visit OT Evaluation $OT Eval Low  Complexity: 1 Low OT Treatments $Self Care/Home Management : 8-22 mins  Gus Rankin, OT Student  Di Kindle Marsha Gundlach 02/27/2019, 10:02 AM

## 2019-02-27 NOTE — Progress Notes (Addendum)
Subjective: Patient reports "I'm doing good. I'm ready to go"  Objective: Vital signs in last 24 hours: Temp:  [97.2 F (36.2 C)-98.4 F (36.9 C)] 98.4 F (36.9 C) (10/28 0725) Pulse Rate:  [59-85] 74 (10/28 0725) Resp:  [11-20] 16 (10/28 0725) BP: (119-188)/(57-116) 143/75 (10/28 0725) SpO2:  [94 %-100 %] 99 % (10/28 0725) Weight:  [76.2 kg] 76.2 kg (10/27 1100)  Intake/Output from previous day: 10/27 0701 - 10/28 0700 In: 1420 [P.O.:120; I.V.:1000; IV Piggyback:250] Out: 850 [Urine:600; Blood:250] Intake/Output this shift: No intake/output data recorded.  Alert, conversant. Reports mild right thigh pain, much improved from pre-op. Good strength BLE. Ambulated in hallway, staing "I can do a lot more with this leg than I could".  Incision slightly raised, without palpable fluid. No erythema or new drainage. Honeycomb with old bloody drainage.   Lab Results: No results for input(s): WBC, HGB, HCT, PLT in the last 72 hours. BMET No results for input(s): NA, K, CL, CO2, GLUCOSE, BUN, CREATININE, CALCIUM in the last 72 hours.  Studies/Results: Dg Lumbar Spine 2-3 Views  Result Date: 02/26/2019 CLINICAL DATA:  L2-3 redo lumbar laminectomy and discectomy. EXAM: DG C-ARM 1-60 MIN; LUMBAR SPINE - 2-3 VIEW COMPARISON:  Intraoperative lateral lumbar spine radiographs from earlier today FLUOROSCOPY TIME:  Fluoroscopy Time:  1 minutes 17 seconds Number of Acquired Spot Images: 2 FINDINGS: Nondiagnostic spot fluoroscopic intraoperative radiographs of the lumbar spine demonstrate bilateral pedicle screws at L2-3 with previous bilateral posterior spinal fusion hardware partially visualized extending inferiorly from the L3 level. IMPRESSION: Intraoperative fluoroscopic guidance for redo L2-3 lumbar laminectomy and discectomy. Electronically Signed   By: Ilona Sorrel M.D.   On: 02/26/2019 19:14   Dg Lumbar Spine 2-3 Views  Result Date: 02/26/2019 CLINICAL DATA:  L2-L3 laminectomy localizer.  EXAM: LUMBAR SPINE - 2-3 VIEW COMPARISON:  MR lumbar spine dated February 06, 2019. FINDINGS: Single cross-table lateral intraoperative x-ray demonstrates needle localizer at the L1-L2 level. Prior L2-L5 fusion. No acute osseous abnormality. IMPRESSION: Needle localizer at the L1-L2 level. Electronically Signed   By: Titus Dubin M.D.   On: 02/26/2019 16:49   Dg C-arm 1-60 Min  Result Date: 02/26/2019 CLINICAL DATA:  L2-3 redo lumbar laminectomy and discectomy. EXAM: DG C-ARM 1-60 MIN; LUMBAR SPINE - 2-3 VIEW COMPARISON:  Intraoperative lateral lumbar spine radiographs from earlier today FLUOROSCOPY TIME:  Fluoroscopy Time:  1 minutes 17 seconds Number of Acquired Spot Images: 2 FINDINGS: Nondiagnostic spot fluoroscopic intraoperative radiographs of the lumbar spine demonstrate bilateral pedicle screws at L2-3 with previous bilateral posterior spinal fusion hardware partially visualized extending inferiorly from the L3 level. IMPRESSION: Intraoperative fluoroscopic guidance for redo L2-3 lumbar laminectomy and discectomy. Electronically Signed   By: Ilona Sorrel M.D.   On: 02/26/2019 19:14    Assessment/Plan: improved  LOS: 1 day  Work with PT this morning. Per Dr. Vertell Limber, d/c to home when cleared by PT. Pt verbalizes understanding of d/c instructions. He will call office to schedule office f/u for 3-4 weeks.    Verdis Prime 02/27/2019, 7:54 AM   Patient is doing well.  Discharge home.

## 2019-02-27 NOTE — Discharge Summary (Signed)
Physician Discharge Summary  Patient ID: Kevin Carlson. MRN: [id number] DOB/AGE: 12/11/1929 85 y.o.  Admit date: 02/26/2019 Discharge date: 02/27/2019  Admission Diagnoses: SPINAL STENOSIS, LUMBAR REGION WITH NEUROGENIC CLAUDICATION, herniated lumbar disc, lumbago, radiculopathy    Discharge Diagnoses: SPINAL STENOSIS, LUMBAR REGION WITH NEUROGENIC CLAUDICATION, herniated lumbar disc, lumbago, radiculopathy s/p REDO LUMBAR LAMINECTOMY AND DISCECTOMY BILATERALLY LUMBAR TWO- LUMBAR THREE Right with exploration of fusion L 23 with revision of fusion L 23 with posterolateral arthrodesis     Active Problems:   Herniated lumbar disc without myelopathy   Discharged Condition: good  Hospital Course: Kevin Carlson was admitted for surgery with dx stenosis and neurogenic claudication. Following uncomplicated surgery (above), he has mobilized well with improved pain control and increased strength.   Consults: None  Significant Diagnostic Studies: radiology: X-Ray: intra-op  Treatments: surgery: REDO LUMBAR LAMINECTOMY AND DISCECTOMY BILATERALLY LUMBAR TWO- LUMBAR THREE Right with exploration of fusion L 23 with revision of fusion L 23 with posterolateral arthrodesis    Discharge Exam: Blood pressure (!) 143/75, pulse 74, temperature 98.4 F (36.9 C), temperature source Oral, resp. rate 16, height 6' (1.829 m), weight 76.2 kg, SpO2 99 %. Alert, conversant. Reports mild right thigh pain, much improved from pre-op. Good strength BLE. Ambulated in hallway, staing "I can do a lot more with this leg than I could". Incision slightly raised, without palpable fluid. No erythema or new drainage. Honeycomb with old bloody drainage.     Disposition:  Discharge to home. Pt verbalizes understanding of d/c instructions. He will call office to schedule office f/u for 3-4 weeks.Norco 10/325 & Robaxin 500mg  will be eRx'ed for prn home use.   Allergies as of 02/27/2019   No  Known Allergies     Medication List    STOP taking these medications   leflunomide 20 MG tablet Commonly known as: ARAVA     TAKE these medications   amLODipine-benazepril 5-10 MG capsule Commonly known as: LOTREL Take 1 capsule by mouth daily.   diphenhydramine-acetaminophen 25-500 MG Tabs tablet Commonly known as: TYLENOL PM Take 2 tablets by mouth at bedtime.   Eliquis 5 MG Tabs tablet Generic drug: apixaban Take 5 mg by mouth 2 (two) times daily.   ergocalciferol 1.25 MG (50000 UT) capsule Commonly known as: VITAMIN D2 Take 50,000 Units by mouth every Monday.   gabapentin 800 MG tablet Commonly known as: NEURONTIN Take 800 mg by mouth 2 (two) times daily.   guaiFENesin 600 MG 12 hr tablet Commonly known as: MUCINEX Take 600 mg by mouth 2 (two) times daily as needed (congestion).   HYDROcodone-acetaminophen 10-325 MG tablet Commonly known as: NORCO Take 1 tablet by mouth every 6 (six) hours as needed for moderate pain.   lansoprazole 30 MG capsule Commonly known as: PREVACID Take 30 mg by mouth daily.   methocarbamol 500 MG tablet Commonly known as: ROBAXIN Take 1 tablet (500 mg total) by mouth every 6 (six) hours as needed for muscle spasms.   metoprolol succinate 25 MG 24 hr tablet Commonly known as: TOPROL-XL Take 25 mg by mouth daily.   predniSONE 5 MG tablet Commonly known as: DELTASONE Take 5 mg by mouth daily with breakfast.   simvastatin 40 MG tablet Commonly known as: ZOCOR Take 40 mg by mouth daily.   sodium chloride 0.65 % Soln nasal spray Commonly known as: OCEAN Place 1 spray into both nostrils at bedtime as needed for congestion.   tamsulosin 0.4 MG Caps capsule Commonly known as: FLOMAX  Take 0.4 mg by mouth daily.   tiZANidine 4 MG tablet Commonly known as: ZANAFLEX Take 4 mg by mouth at bedtime.      Follow-up Information    Erline Levine, MD Follow up.   Specialty: Neurosurgery Contact information: [address] [place] [address] [phone]           Signed: [name], MD 02/27/2019, 8:42 AM

## 2019-02-27 NOTE — Care Management CC44 (Signed)
Condition Code 44 Documentation Completed  Patient Details  Name: Kevin Carlson. MRN: [id number] Date of Birth: December 11, 1929   Condition Code 44 given:  Yes Patient signature on Condition Code 44 notice:  Yes Documentation of 2 MD's agreement:  Yes Code 44 added to claim:  Yes    Benard Halsted, LCSW 02/27/2019, 9:29 AM

## 2019-02-27 NOTE — Evaluation (Signed)
Physical Therapy Evaluation Patient Details Name: Kevin Carlson. MRN: [id number] DOB: January 23, 1930 Today's Date: 02/27/2019   History of Present Illness  Pt is 85 yo male s/p a redo of lumbar laminectomy and discectomy bilaterally L2-3. PMH including prior total laminactomy, cervical DDD, Bilateral TKA, afib, HTN, and neuropathy.   Clinical Impression  Pt admitted with above diagnosis. At the time of PT eval, pt was able to demonstrate transfers and ambulation with gross supervision for safety (short bout of min guard assist initially when ambulating without the RW). Pt was educated on precautions, brace application/wearing schedule, appropriate activity progression, and car transfer. Recommend pt have supervision at home during waking hours at least the first few days, as pt reports 24 hour supervision is not an option. Between the pt's daughter and his girlfriend, he states this would be manageable. Pt currently with functional limitations due to the deficits listed below (see PT Problem List). Pt will benefit from skilled PT to increase their independence and safety with mobility to allow discharge to the venue listed below.      Follow Up Recommendations No PT follow up;Supervision for mobility/OOB    Equipment Recommendations  None recommended by PT    Recommendations for Other Services       Precautions / Restrictions Precautions Precautions: Back;Fall Precaution Booklet Issued: No Precaution Comments: Pt recalled 3/3 precautions with increased time. pt required max VCs to maintain precautions during ADLs Required Braces or Orthoses: Spinal Brace Spinal Brace: Lumbar corset Spinal Brace Comments: Daughter is bringing brace from home Restrictions Weight Bearing Restrictions: No      Mobility  Bed Mobility Overal bed mobility: Needs Assistance Bed Mobility: Rolling;Sidelying to Sit Rolling: Modified independent (Device/Increase time) Sidelying to sit: Supervision        General bed mobility comments: HOB flat and rails lowered to simulate home environment. Pt was able to transition to EOB with close supervision for trunk elevation to full sitting position.   Transfers Overall transfer level: Needs assistance Equipment used: Rolling walker (2 wheeled) Transfers: Sit to/from Stand Sit to Stand: Supervision         General transfer comment: Light supervision for safety as pt powered up to full standing position. Pt demonstrated proper hand placement on seated surface for safety.   Ambulation/Gait Ambulation/Gait assistance: Supervision Gait Distance (Feet): 250 Feet Assistive device: Rolling walker (2 wheeled);None Gait Pattern/deviations: Step-through pattern;Decreased stride length;Trunk flexed Gait velocity: Decreased Gait velocity interpretation: <1.8 ft/sec, indicate of risk for recurrent falls General Gait Details: Pt ambulated most of the way with the RW for support, and ~50 feet without an AD. Pt did well without UE support and actually improved his gait speed, however reports he likes the stability of the RW at this time, and opted to continue using it.  Stairs            Wheelchair Mobility    Modified Rankin (Stroke Patients Only)       Balance Overall balance assessment: Needs assistance Sitting-balance support: No upper extremity supported;Feet supported Sitting balance-Leahy Scale: Good     Standing balance support: No upper extremity supported;During functional activity Standing balance-Leahy Scale: Fair Standing balance comment: Pt able to maintain static standing for short period of time to perform grooming task at sink                             Pertinent Vitals/Pain Pain Assessment: Faces Faces Pain Scale: Hurts little  more Pain Location: back Pain Descriptors / Indicators: Aching;Discomfort;Sore Pain Intervention(s): Limited activity within patient's tolerance;Monitored during session;Repositioned     Home Living Family/patient expects to be discharged to:: Private residence Living Arrangements: Alone Available Help at Discharge: Family;Friend(s);Available PRN/intermittently Type of Home: House Home Access: Stairs to enter   Entrance Stairs-Number of Steps: 1 Home Layout: Two level;Able to live on main level with bedroom/bathroom Home Equipment: Gilford Rile - 2 wheels;Cane - single point;Shower seat Additional Comments: pt reports not needed walker or shower seat    Prior Function Level of Independence: Independent         Comments: Pt reports daughter lives within walking distance of him, and is in and out of his home throughout the day assisting with housework and laundry. Pt states between his daughter and his girlfriend he will be able to have someone available during his waking hours at least for the first few days.      Hand Dominance   Dominant Hand: Right    Extremity/Trunk Assessment   Upper Extremity Assessment Upper Extremity Assessment: Defer to OT evaluation    Lower Extremity Assessment Lower Extremity Assessment: Generalized weakness    Cervical / Trunk Assessment Cervical / Trunk Assessment: Other exceptions Cervical / Trunk Exceptions: s/p spinal surgery  Communication   Communication: HOH  Cognition Arousal/Alertness: Awake/alert Behavior During Therapy: WFL for tasks assessed/performed Overall Cognitive Status: Within Functional Limits for tasks assessed                                 General Comments: Pt demonstrated difficulty adhering to back precautions when performing ADLs. Pt appears determined to go home today, and believes that he does not require supervision when he is at home.       General Comments      Exercises     Assessment/Plan    PT Assessment Patient needs continued PT services  PT Problem List Decreased strength;Decreased balance;Decreased activity tolerance;Decreased mobility;Decreased knowledge of use  of DME;Decreased safety awareness;Decreased knowledge of precautions;Pain       PT Treatment Interventions DME instruction;Gait training;Functional mobility training;Therapeutic activities;Therapeutic exercise;Neuromuscular re-education;Patient/family education;Stair training    PT Goals (Current goals can be found in the Care Plan section)  Acute Rehab PT Goals Patient Stated Goal: go home today PT Goal Formulation: With patient Time For Goal Achievement: 03/06/19 Potential to Achieve Goals: Good    Frequency Min 5X/week   Barriers to discharge        Co-evaluation               AM-PAC PT "6 Clicks" Mobility  Outcome Measure Help needed turning from your back to your side while in a flat bed without using bedrails?: None Help needed moving from lying on your back to sitting on the side of a flat bed without using bedrails?: None Help needed moving to and from a bed to a chair (including a wheelchair)?: None Help needed standing up from a chair using your arms (e.g., wheelchair or bedside chair)?: None Help needed to walk in hospital room?: A Little Help needed climbing 3-5 steps with a railing? : A Little 6 Click Score: 22    End of Session Equipment Utilized During Treatment: Gait belt Activity Tolerance: Patient tolerated treatment well Patient left: in chair;with call bell/phone within reach Nurse Communication: Mobility status PT Visit Diagnosis: Unsteadiness on feet (R26.81);Pain Pain - part of body: (back)    Time:  [id number] PT Time Calculation (min) (ACUTE ONLY): 23 min   Charges:   PT Evaluation $PT Eval Moderate Complexity: 1 Mod PT Treatments $Gait Training: 8-22 mins        Rolinda Roan, PT, DPT Acute Rehabilitation Services Pager: [phone] Office: [phone]   Thelma Comp 02/27/2019, 10:46 AM

## 2019-02-27 NOTE — Discharge Instructions (Signed)

## 2019-02-27 NOTE — Anesthesia Postprocedure Evaluation (Signed)
Anesthesia Post Note  Patient: Kevin Carlson.  Procedure(s) Performed: REDO LUMBAR LAMINECTOMY AND DISCECTOMY BILATERALLY LUMBAR TWO- LUMBAR THREE RIGHT WITH REVISION OF LUMBAR TWO-THREE FUSION WITH POSTEROLATERAL ARTHRODESIS (Bilateral Spine Lumbar)     Patient location during evaluation: PACU Anesthesia Type: General Level of consciousness: awake and alert Pain management: pain level controlled Vital Signs Assessment: post-procedure vital signs reviewed and stable Respiratory status: spontaneous breathing, nonlabored ventilation, respiratory function stable and patient connected to nasal cannula oxygen Cardiovascular status: blood pressure returned to baseline and stable Postop Assessment: no apparent nausea or vomiting Anesthetic complications: no    Last Vitals:  Vitals:   02/27/19 0359 02/27/19 0725  BP: 125/71 (!) 143/75  Pulse: 64 74  Resp: 20 16  Temp: 36.5 C 36.9 C  SpO2: 98% 99%    Last Pain:  Vitals:   02/27/19 0725  TempSrc: Oral  PainSc:                  [place]

## 2019-02-27 NOTE — Plan of Care (Signed)
Patient alert and oriented, mae's well, voiding adequate amount of urine, swallowing without difficulty, no c/o pain at time of discharge. Patient discharged home with family. Script and discharged instructions given to patient. Patient and family stated understanding of instructions given. Patient has an appointment with Dr.Stern    

## 2019-02-28 ENCOUNTER — Encounter (HOSPITAL_COMMUNITY): Payer: Self-pay | Admitting: Neurosurgery

## 2019-03-04 ENCOUNTER — Other Ambulatory Visit: Payer: Self-pay | Admitting: Neurosurgery

## 2019-03-05 ENCOUNTER — Other Ambulatory Visit (HOSPITAL_COMMUNITY)
Admission: RE | Admit: 2019-03-05 | Discharge: 2019-03-05 | Disposition: A | Discharge status: Home / Self Care | Payer: Medicare Other | Source: Ambulatory Visit | Attending: Neurosurgery | Admitting: Neurosurgery

## 2019-03-05 ENCOUNTER — Other Ambulatory Visit: Payer: Self-pay

## 2019-03-05 LAB — SARS CORONAVIRUS 2 (TAT 6-24 HRS): SARS Coronavirus 2: NEGATIVE

## 2019-03-05 NOTE — H&P (Signed)
Patient ID:   [id number] Patient: Kevin Carlson  Date of Birth: 1929/10/05 Visit Type: Office Visit   Date: 03/04/2019 09:00 AM Provider: Marchia Meiers. Vertell Limber MD   This 85 year old male presents for Post Op.  HISTORY OF PRESENT ILLNESS: 1.  Post Op  02/26/2019 redo laminectomy with diskectomy L2-3, revision of fusion  Patient returns after calling Friday with reported increased right lower extremity pain.  Today, he notes steroid has been helpful over the weekend, but he continues to experience right lower extremity and lumbar pain upon standing or walking.  Sitting or lying down offers pain relief.  Norco 10/325 Robaxin 500 mg Medrol Dosepak  X-rays on canopy  The patient is getting pain in his low back and right leg when he stands up.  I believes that the lever arm of the L2-3 segment is causing nerve compression and recurrent disc protrusion.  The retrolisthesis is contributing to worsening spinal stenosis.  It was not an option to place an interbody graft at the time of his surgery because of the scar tissue and lack of access and I did not feel that a lateral procedure in addition to what I did posteriorly was appropriate since we had never discuss this.  In light of the patient's rapid deterioration immediately after surgery I feel that this is now necessary.  I have therefore recommended a right sided XLIF at L2-3 with lateral plating.  The patient is doing well when he sits and only has pain when he stands.  This should allow him to heal in this segment and not to have further worsening of disc herniation, spondylolisthesis, spinal stenosis.  He wishes to go ahead with this on an expedited basis and we will plan on doing so this week.      Medical/Surgical/Interim History Reviewed, no change.  Last detailed document date:03/04/2015.     Family History: Reviewed, no changes.  Last detailed document date:03/04/2015.   Social History: Reviewed, no changes. Last detailed  document date: 03/04/2015.    MEDICATIONS: (added, continued or stopped this visit) Started Medication Directions Instruction Stopped  amlodipine 5 mg-benazepril 10 mg capsule take 1 capsule by oral route  every day   10/15/2018 Keflex 500 mg capsule take 1 capsule by oral route  every 8 hours    lansoprazole 30 mg capsule,delayed release take 1 capsule by oral route  every day before a meal   03/01/2019 Medrol (Pak) 4 mg tablets in a dose pack take by Oral route as directed   03/01/2019 methocarbamol 500 mg tablet take 1 tablet by oral route 3 times every day as needed    metoprolol tartrate 25 mg tablet take 1 tablet by oral route 2 times every day    multivitamin tablet take 1 by oral route  every day   03/01/2019 Norco 10 mg-325 mg tablet take 1 tablet by oral route 3 times every day as needed for pain    prednisone 5 mg tablet take 1 tablet by oral route  every day    simvastatin 40 mg tablet take 1 tablet by oral route  every day in the evening    tamsulosin 0.4 mg capsule take 1 capsule by oral route  every day    tizanidine 4 mg tablet take 1 tablet by oral route  every day at bedtime      ALLERGIES: Ingredient Reaction Medication Name Comment NO KNOWN ALLERGIES    No known allergies. Reviewed, no changes.    PHYSICAL EXAM:  Vitals Date Temp F BP Pulse Ht In Wt Lb BMI BSA Pain Score 03/04/2019 97.7 173/88 80 74 171.6 22.03  10/10     IMPRESSION:  Instability at L2-3 with spinal stenosis and disc herniation  PLAN: XL IF L2-3 from the right with lateral plating.  Orders: Instruction(s)/Education: Assessment Instruction I10 Lifestyle education  Completed Orders (this encounter) Order Details Reason Side Interpretation Result Initial Treatment Date Region Lifestyle education Patient will follow up with primary care physician.        Assessment/Plan  # Detail  Type Description  1. Assessment Radiculopathy, lumbar region (M54.16).     2. Assessment Essential (primary) hypertension (I10).       Pain Management Plan Pain Scale: 10/10. Method: Numeric Pain Intensity Scale. Location: back. Onset: 09/01/2014. Duration: varies. Quality: discomforting. Pain management follow-up plan of care: Patient will continue medication management.              Provider:  Marchia Meiers. Vertell Limber MD  03/04/2019 09:19 AM    Dictation edited by: Marchia Meiers. Vertell Limber    CC Providers: PCP  None   Otho Darner  [address] [place] [address]-               Electronically signed by Marchia Meiers Vertell Limber MD on 03/04/2019 09:19 AM

## 2019-03-06 ENCOUNTER — Encounter (HOSPITAL_COMMUNITY): Payer: Self-pay | Admitting: *Deleted

## 2019-03-06 ENCOUNTER — Other Ambulatory Visit: Payer: Self-pay

## 2019-03-06 NOTE — Progress Notes (Signed)
Anesthesia Chart Review: Same day workup  Pt recently underwent redo lumbar laminectomy on A999333 without complication. Prior to that surgery he was cleared by cardiologist:  Follows with cardiology, Dr. Cleora Fleet, for hx of permanent afib and coronary atherosclerosis. Last seen 10/01/18 for preop clearance prior to lumbar laminectomy/discectomy with extension of previous fusion which was performed [id number] without complication. Per clearance at that time, "Patient scheduled for laminectomy by Dr. Vertell Limber, after evaluating the patient, review of cardiac history, current cardiac status, medications, patient seems to be low risk for the planned procedure.  This was discussed with the patient.  A copy of this note will be forwarded to a surgeon.  Medication structures: Advised to hold Eliquis 24 to 48 hours prior to the procedure. Note also summarizes cardiac testing history: " 02/14/2012: Abnormal EST.  LHC with normal coronaries EF 60%.  05/27/2015 Lexiscan: Likely normal Lexiscan myocardial perfusion imaging study.  There is a small area of moderately decreased counts in the distal inferior wall without reversibility.  This likely represents diaphragmatic attenuation; however, a small area of distal inferior infarct cannot be fully occluded.  Calculated ejection fraction is 55% with normal wall motion."  Labs 02/22/19 reviewed. Mild renal insufficiency with creatinine 1.39, mild anemia with Hgb 10.9. Consistent with prior labs.   EKG 02/22/19: Atrial fibrillation. Vent rate 70.   Wynonia Musty [facility] Short Stay Center/Anesthesiology Phone [phone] 03/06/2019 10:42 AM

## 2019-03-06 NOTE — Progress Notes (Signed)
Patient denies shortness of breath, fever, cough and chest pain.  PCP - Dr Dory Horn Cardiologist- Dr Cleora Fleet, [place]  Chest x-ray - denies EKG - 02/22/19 Stress Test - yrs ago in [place] ECHO - yrs ago in [place] - yrs ago in [place] - no blockages  Blood Thinner Instructions:  Follow your surgeon's instructions on when to stop prior to surgery.  Eliquis last dose on 03/04/19.  Anesthesia review: Yes  STOP now taking any Aspirin (unless otherwise instructed by your surgeon), Aleve, Naproxen, Ibuprofen, Motrin, Advil, Goody's, BC's, all herbal medications, fish oil, and all vitamins.   Coronavirus Screening Covid test on 03/05/19 was negative.  Patient verbalized understanding of instructions that were given via phone.

## 2019-03-06 NOTE — Anesthesia Preprocedure Evaluation (Addendum)
Anesthesia Evaluation  Patient identified by MRN, date of birth, ID band Patient awake    Reviewed: Allergy & Precautions, NPO status , Patient's Chart, lab work & pertinent test results, reviewed documented beta blocker date and time   History of Anesthesia Complications Negative for: history of anesthetic complications  Airway Mallampati: II  TM Distance: >3 FB Neck ROM: Full    Dental no notable dental hx.    Pulmonary former smoker,    Pulmonary exam normal        Cardiovascular hypertension, Pt. on medications and Pt. on home beta blockers Normal cardiovascular exam+ dysrhythmias (on Eliquis, last dose 03/04/19) Atrial Fibrillation      Neuro/Psych negative neurological ROS  negative psych ROS   GI/Hepatic Neg liver ROS, hiatal hernia, GERD  Controlled and Medicated,  Endo/Other  negative endocrine ROS  Renal/GU Renal InsufficiencyRenal disease  negative genitourinary   Musculoskeletal  (+) Arthritis , Polymyalgia rheumatica   Abdominal   Peds  Hematology  (+) anemia , Hgb 10.9   Anesthesia Other Findings Day of surgery medications reviewed with patient.  Reproductive/Obstetrics negative OB ROS                           Anesthesia Physical Anesthesia Plan  ASA: II  Anesthesia Plan: General   Post-op Pain Management:    Induction: Intravenous  PONV Risk Score and Plan: 3 and Treatment may vary due to age or medical condition and Ondansetron  Airway Management Planned: Oral ETT  Additional Equipment: None  Intra-op Plan:   Post-operative Plan: Extubation in OR  Informed Consent: I have reviewed the patients History and Physical, chart, labs and discussed the procedure including the risks, benefits and alternatives for the proposed anesthesia with the patient or authorized representative who has indicated his/her understanding and acceptance.     Dental advisory  given  Plan Discussed with: CRNA  Anesthesia Plan Comments: (Pt recently underwent redo lumbar laminectomy on A999333 without complication. Prior to that surgery he was cleared by cardiologist:  Follows with cardiology, Dr. Cleora Fleet, for hx of permanent afib and coronary atherosclerosis. Last seen 10/01/18 for preop clearance prior to lumbar laminectomy/discectomy with extension of previous fusion which was performed [id number] without complication. Per clearance at that time, "Patient scheduled for laminectomy by Dr. Vertell Limber, after evaluating the patient, review of cardiac history, current cardiac status, medications, patient seems to be low risk for the planned procedure.  This was discussed with the patient.  A copy of this note will be forwarded to a surgeon.  Medication structures: Advised to hold Eliquis 24 to 48 hours prior to the procedure. Note also summarizes cardiac testing history: " 02/14/2012: Abnormal EST.  LHC with normal coronaries EF 60%.  05/27/2015 Lexiscan: Likely normal Lexiscan myocardial perfusion imaging study.  There is a small area of moderately decreased counts in the distal inferior wall without reversibility.  This likely represents diaphragmatic attenuation; however, a small area of distal inferior infarct cannot be fully occluded.  Calculated ejection fraction is 55% with normal wall motion."  Labs 02/22/19 reviewed. Mild renal insufficiency with creatinine 1.39, mild anemia with Hgb 10.9. Consistent with prior labs.   EKG 02/22/19: Atrial fibrillation. Vent rate 70.)      Anesthesia Quick Evaluation

## 2019-03-07 ENCOUNTER — Encounter (HOSPITAL_COMMUNITY)
Admission: RE | Disposition: A | Discharge status: Home / Self Care | Payer: Self-pay | Source: Home / Self Care | Attending: Neurosurgery

## 2019-03-07 ENCOUNTER — Inpatient Hospital Stay (HOSPITAL_COMMUNITY): Payer: Medicare Other | Admitting: Physician Assistant

## 2019-03-07 ENCOUNTER — Inpatient Hospital Stay (HOSPITAL_COMMUNITY): Payer: Medicare Other

## 2019-03-07 ENCOUNTER — Inpatient Hospital Stay (HOSPITAL_COMMUNITY)
Admission: RE | Admit: 2019-03-07 | Discharge: 2019-03-09 | DRG: 460 | Disposition: A | Discharge status: Home / Self Care | Payer: Medicare Other | Attending: Neurosurgery | Admitting: Neurosurgery

## 2019-03-07 ENCOUNTER — Encounter (HOSPITAL_COMMUNITY): Payer: Self-pay

## 2019-03-07 DIAGNOSIS — M5416 Radiculopathy, lumbar region: Secondary | ICD-10-CM | POA: Diagnosis present

## 2019-03-07 DIAGNOSIS — I4821 Permanent atrial fibrillation: Secondary | ICD-10-CM | POA: Diagnosis present

## 2019-03-07 DIAGNOSIS — I10 Essential (primary) hypertension: Secondary | ICD-10-CM | POA: Diagnosis present

## 2019-03-07 DIAGNOSIS — Z419 Encounter for procedure for purposes other than remedying health state, unspecified: Secondary | ICD-10-CM

## 2019-03-07 DIAGNOSIS — M4316 Spondylolisthesis, lumbar region: Secondary | ICD-10-CM | POA: Diagnosis present

## 2019-03-07 DIAGNOSIS — I2510 Atherosclerotic heart disease of native coronary artery without angina pectoris: Secondary | ICD-10-CM | POA: Diagnosis present

## 2019-03-07 DIAGNOSIS — N289 Disorder of kidney and ureter, unspecified: Secondary | ICD-10-CM | POA: Diagnosis present

## 2019-03-07 DIAGNOSIS — M48062 Spinal stenosis, lumbar region with neurogenic claudication: Principal | ICD-10-CM | POA: Diagnosis present

## 2019-03-07 DIAGNOSIS — Z20828 Contact with and (suspected) exposure to other viral communicable diseases: Secondary | ICD-10-CM | POA: Diagnosis present

## 2019-03-07 HISTORY — PX: ANTERIOR LAT LUMBAR FUSION: SHX1168

## 2019-03-07 HISTORY — DX: Personal history of other medical treatment: Z92.89

## 2019-03-07 LAB — BASIC METABOLIC PANEL
Anion gap: 8 (ref 5–15)
BUN: 23 mg/dL (ref 8–23)
CO2: 24 mmol/L (ref 22–32)
Calcium: 8.8 mg/dL — ABNORMAL LOW (ref 8.9–10.3)
Chloride: 106 mmol/L (ref 98–111)
Creatinine, Ser: 1.03 mg/dL (ref 0.61–1.24)
GFR calc Af Amer: >60 mL/min (ref >60)
GFR calc non Af Amer: >60 mL/min (ref >60)
Glucose, Bld: 92 mg/dL (ref 70–99)
Potassium: 4.3 mmol/L (ref 3.5–5.1)
Sodium: 138 mmol/L (ref 135–145)

## 2019-03-07 LAB — PROTIME-INR
INR: 0.9 (ref 0.8–1.2)
Prothrombin Time: 12.2 seconds (ref 11.4–15.2)

## 2019-03-07 LAB — CBC
HCT: 30.8 % — ABNORMAL LOW (ref 39.0–52.0)
Hemoglobin: 9.8 g/dL — ABNORMAL LOW (ref 13.0–17.0)
MCH: 32.8 pg (ref 26.0–34.0)
MCHC: 31.8 g/dL (ref 30.0–36.0)
MCV: 103 fL — ABNORMAL HIGH (ref 80.0–100.0)
Platelets: 313 10*3/uL (ref 150–400)
RBC: 2.99 MIL/uL — ABNORMAL LOW (ref 4.22–5.81)
RDW: 19.3 % — ABNORMAL HIGH (ref 11.5–15.5)
WBC: 15.6 10*3/uL — ABNORMAL HIGH (ref 4.0–10.5)
nRBC: 0 % (ref 0.0–0.2)

## 2019-03-07 LAB — TYPE AND SCREEN
ABO/RH(D): A POS
Antibody Screen: NEGATIVE

## 2019-03-07 SURGERY — ANTERIOR LATERAL LUMBAR FUSION 1 LEVEL
Anesthesia: General | Site: Spine Lumbar | Laterality: Right

## 2019-03-07 MED ORDER — PREDNISONE 5 MG PO TABS
5.0000 mg | ORAL_TABLET | Freq: Every day | ORAL | Status: DC
  Administered 2019-03-08 – 2019-03-09 (×2): 5 mg via ORAL
  Filled 2019-03-07 (×2): qty 1

## 2019-03-07 MED ORDER — THROMBIN 5000 UNITS EX SOLR
OROMUCOSAL | Status: DC | PRN
  Administered 2019-03-07: 12:00:00 via TOPICAL

## 2019-03-07 MED ORDER — DIPHENHYDRAMINE-APAP (SLEEP) 25-500 MG PO TABS: 2.0000 | ORAL_TABLET | Freq: Every day | ORAL | Status: DC

## 2019-03-07 MED ORDER — ONDANSETRON HCL 4 MG PO TABS: 4.0000 mg | ORAL_TABLET | Freq: Four times a day (QID) | ORAL | Status: DC | PRN

## 2019-03-07 MED ORDER — HYDROCODONE-ACETAMINOPHEN 5-325 MG PO TABS: 2.0000 | ORAL_TABLET | ORAL | Status: DC | PRN

## 2019-03-07 MED ORDER — KCL IN DEXTROSE-NACL 20-5-0.45 MEQ/L-%-% IV SOLN: INTRAVENOUS | Status: DC

## 2019-03-07 MED ORDER — TIZANIDINE HCL 4 MG PO TABS
4.0000 mg | ORAL_TABLET | Freq: Every day | ORAL | Status: DC
  Administered 2019-03-07 – 2019-03-08 (×2): 4 mg via ORAL
  Filled 2019-03-07 (×2): qty 1

## 2019-03-07 MED ORDER — AMLODIPINE BESY-BENAZEPRIL HCL 5-10 MG PO CAPS: 1.0000 | ORAL_CAPSULE | Freq: Every day | ORAL | Status: DC

## 2019-03-07 MED ORDER — POLYETHYLENE GLYCOL 3350 17 G PO PACK
17.0000 g | PACK | Freq: Every day | ORAL | Status: DC | PRN
  Administered 2019-03-08: 17 g via ORAL
  Filled 2019-03-07: qty 1

## 2019-03-07 MED ORDER — LIDOCAINE-EPINEPHRINE 1 %-1:100000 IJ SOLN
INTRAMUSCULAR | Status: AC
  Filled 2019-03-07: qty 1

## 2019-03-07 MED ORDER — MORPHINE SULFATE (PF) 2 MG/ML IV SOLN: 2.0000 mg | INTRAVENOUS | Status: DC | PRN

## 2019-03-07 MED ORDER — PHENYLEPHRINE HCL-NACL 10-0.9 MG/250ML-% IV SOLN
INTRAVENOUS | Status: DC | PRN
  Administered 2019-03-07: 50 ug/min via INTRAVENOUS
  Administered 2019-03-07: 60 ug/min via INTRAVENOUS
  Administered 2019-03-07: 30 ug/min via INTRAVENOUS

## 2019-03-07 MED ORDER — CEFAZOLIN SODIUM-DEXTROSE 2-4 GM/100ML-% IV SOLN
2.0000 g | INTRAVENOUS | Status: AC
  Administered 2019-03-07: 2 g via INTRAVENOUS

## 2019-03-07 MED ORDER — OXYCODONE HCL 5 MG PO TABS
ORAL_TABLET | ORAL | Status: AC
  Filled 2019-03-07: qty 1

## 2019-03-07 MED ORDER — ONDANSETRON HCL 4 MG/2ML IJ SOLN
INTRAMUSCULAR | Status: AC
  Filled 2019-03-07: qty 2

## 2019-03-07 MED ORDER — LACTATED RINGERS IV SOLN
INTRAVENOUS | Status: DC
  Administered 2019-03-07: 09:00:00 via INTRAVENOUS

## 2019-03-07 MED ORDER — ZOLPIDEM TARTRATE 5 MG PO TABS: 5.0000 mg | ORAL_TABLET | Freq: Every evening | ORAL | Status: DC | PRN

## 2019-03-07 MED ORDER — METHOCARBAMOL 500 MG PO TABS: 500.0000 mg | ORAL_TABLET | Freq: Four times a day (QID) | ORAL | Status: DC | PRN

## 2019-03-07 MED ORDER — LIDOCAINE-EPINEPHRINE 1 %-1:100000 IJ SOLN
INTRAMUSCULAR | Status: DC | PRN
  Administered 2019-03-07: 10 mL

## 2019-03-07 MED ORDER — PHENOL 1.4 % MT LIQD: 1.0000 | OROMUCOSAL | Status: DC | PRN

## 2019-03-07 MED ORDER — FENTANYL CITRATE (PF) 100 MCG/2ML IJ SOLN
25.0000 ug | INTRAMUSCULAR | Status: DC | PRN
  Administered 2019-03-07 (×2): 50 ug via INTRAVENOUS

## 2019-03-07 MED ORDER — OXYCODONE HCL 5 MG/5ML PO SOLN: 5.0000 mg | Freq: Once | ORAL | Status: AC | PRN

## 2019-03-07 MED ORDER — OXYCODONE HCL 5 MG PO TABS
5.0000 mg | ORAL_TABLET | Freq: Once | ORAL | Status: AC | PRN
  Administered 2019-03-07: 5 mg via ORAL

## 2019-03-07 MED ORDER — FENTANYL CITRATE (PF) 100 MCG/2ML IJ SOLN
INTRAMUSCULAR | Status: AC
  Filled 2019-03-07: qty 2

## 2019-03-07 MED ORDER — DIPHENHYDRAMINE HCL 25 MG PO CAPS
25.0000 mg | ORAL_CAPSULE | Freq: Every evening | ORAL | Status: DC | PRN
  Administered 2019-03-07 – 2019-03-08 (×2): 25 mg via ORAL
  Filled 2019-03-07 (×2): qty 1

## 2019-03-07 MED ORDER — SUCCINYLCHOLINE CHLORIDE 200 MG/10ML IV SOSY
PREFILLED_SYRINGE | INTRAVENOUS | Status: AC
  Filled 2019-03-07: qty 10

## 2019-03-07 MED ORDER — OXYCODONE HCL 5 MG PO TABS
5.0000 mg | ORAL_TABLET | ORAL | Status: DC | PRN
  Administered 2019-03-07 – 2019-03-09 (×8): 10 mg via ORAL
  Filled 2019-03-07 (×8): qty 2

## 2019-03-07 MED ORDER — SODIUM CHLORIDE 0.9% FLUSH: 3.0000 mL | Freq: Two times a day (BID) | INTRAVENOUS | Status: DC

## 2019-03-07 MED ORDER — METHOCARBAMOL 500 MG PO TABS
500.0000 mg | ORAL_TABLET | Freq: Four times a day (QID) | ORAL | Status: DC | PRN
  Administered 2019-03-07 – 2019-03-09 (×5): 500 mg via ORAL
  Filled 2019-03-07 (×5): qty 1

## 2019-03-07 MED ORDER — BENAZEPRIL HCL 10 MG PO TABS
10.0000 mg | ORAL_TABLET | Freq: Every day | ORAL | Status: DC
  Administered 2019-03-08: 10 mg via ORAL
  Filled 2019-03-07 (×2): qty 1

## 2019-03-07 MED ORDER — METOPROLOL SUCCINATE ER 25 MG PO TB24
25.0000 mg | ORAL_TABLET | Freq: Every day | ORAL | Status: DC
  Administered 2019-03-08: 25 mg via ORAL
  Filled 2019-03-07: qty 1

## 2019-03-07 MED ORDER — ONDANSETRON HCL 4 MG/2ML IJ SOLN: 4.0000 mg | Freq: Four times a day (QID) | INTRAMUSCULAR | Status: DC | PRN

## 2019-03-07 MED ORDER — GUAIFENESIN ER 600 MG PO TB12
600.0000 mg | ORAL_TABLET | Freq: Two times a day (BID) | ORAL | Status: DC | PRN
  Administered 2019-03-07 – 2019-03-09 (×3): 600 mg via ORAL
  Filled 2019-03-07 (×4): qty 1

## 2019-03-07 MED ORDER — ONDANSETRON HCL 4 MG/2ML IJ SOLN
INTRAMUSCULAR | Status: DC | PRN
  Administered 2019-03-07: 4 mg via INTRAVENOUS

## 2019-03-07 MED ORDER — DOCUSATE SODIUM 100 MG PO CAPS
100.0000 mg | ORAL_CAPSULE | Freq: Two times a day (BID) | ORAL | Status: DC
  Administered 2019-03-07 – 2019-03-08 (×4): 100 mg via ORAL
  Filled 2019-03-07 (×3): qty 1

## 2019-03-07 MED ORDER — PROMETHAZINE HCL 25 MG/ML IJ SOLN: 6.2500 mg | INTRAMUSCULAR | Status: DC | PRN

## 2019-03-07 MED ORDER — DEXAMETHASONE SODIUM PHOSPHATE 10 MG/ML IJ SOLN
INTRAMUSCULAR | Status: AC
  Filled 2019-03-07: qty 1

## 2019-03-07 MED ORDER — MENTHOL 3 MG MT LOZG: 1.0000 | LOZENGE | OROMUCOSAL | Status: DC | PRN

## 2019-03-07 MED ORDER — METHOCARBAMOL 1000 MG/10ML IJ SOLN
500.0000 mg | Freq: Four times a day (QID) | INTRAVENOUS | Status: DC | PRN
  Filled 2019-03-07: qty 5

## 2019-03-07 MED ORDER — ACETAMINOPHEN 500 MG PO TABS: 1000.0000 mg | ORAL_TABLET | Freq: Once | ORAL | Status: DC

## 2019-03-07 MED ORDER — VITAMIN D (ERGOCALCIFEROL) 1.25 MG (50000 UNIT) PO CAPS: 50000.0000 [IU] | ORAL_CAPSULE | ORAL | Status: DC

## 2019-03-07 MED ORDER — CHLORHEXIDINE GLUCONATE CLOTH 2 % EX PADS: 6.0000 | MEDICATED_PAD | Freq: Once | CUTANEOUS | Status: DC

## 2019-03-07 MED ORDER — HYDROCODONE-ACETAMINOPHEN 10-325 MG PO TABS
1.0000 | ORAL_TABLET | Freq: Four times a day (QID) | ORAL | Status: DC
  Administered 2019-03-07 – 2019-03-08 (×4): 1 via ORAL
  Filled 2019-03-07 (×4): qty 1

## 2019-03-07 MED ORDER — ROCURONIUM BROMIDE 10 MG/ML (PF) SYRINGE
PREFILLED_SYRINGE | INTRAVENOUS | Status: AC
  Filled 2019-03-07: qty 10

## 2019-03-07 MED ORDER — FENTANYL CITRATE (PF) 250 MCG/5ML IJ SOLN
INTRAMUSCULAR | Status: AC
  Filled 2019-03-07: qty 5

## 2019-03-07 MED ORDER — HYDROCODONE-ACETAMINOPHEN 10-325 MG PO TABS: 1.0000 | ORAL_TABLET | Freq: Four times a day (QID) | ORAL | Status: DC | PRN

## 2019-03-07 MED ORDER — PANTOPRAZOLE SODIUM 40 MG PO TBEC
40.0000 mg | DELAYED_RELEASE_TABLET | Freq: Every day | ORAL | Status: DC
  Administered 2019-03-07 – 2019-03-08 (×2): 40 mg via ORAL
  Filled 2019-03-07 (×2): qty 1

## 2019-03-07 MED ORDER — SALINE SPRAY 0.65 % NA SOLN
1.0000 | Freq: Every evening | NASAL | Status: DC | PRN
  Filled 2019-03-07: qty 44

## 2019-03-07 MED ORDER — SIMVASTATIN 20 MG PO TABS
40.0000 mg | ORAL_TABLET | Freq: Every day | ORAL | Status: DC
  Administered 2019-03-08: 40 mg via ORAL
  Filled 2019-03-07: qty 2

## 2019-03-07 MED ORDER — BUPIVACAINE HCL (PF) 0.5 % IJ SOLN
INTRAMUSCULAR | Status: AC
  Filled 2019-03-07: qty 30

## 2019-03-07 MED ORDER — LIDOCAINE 2% (20 MG/ML) 5 ML SYRINGE
INTRAMUSCULAR | Status: DC | PRN
  Administered 2019-03-07 (×2): 40 mg via INTRAVENOUS

## 2019-03-07 MED ORDER — FENTANYL CITRATE (PF) 100 MCG/2ML IJ SOLN
INTRAMUSCULAR | Status: DC | PRN
  Administered 2019-03-07: 100 ug via INTRAVENOUS
  Administered 2019-03-07 (×3): 50 ug via INTRAVENOUS
  Administered 2019-03-07: 100 ug via INTRAVENOUS
  Administered 2019-03-07 (×3): 50 ug via INTRAVENOUS

## 2019-03-07 MED ORDER — CEFAZOLIN SODIUM-DEXTROSE 2-4 GM/100ML-% IV SOLN
2.0000 g | Freq: Three times a day (TID) | INTRAVENOUS | Status: DC
  Administered 2019-03-07: 2 g via INTRAVENOUS
  Filled 2019-03-07: qty 100

## 2019-03-07 MED ORDER — SODIUM CHLORIDE 0.9% FLUSH: 3.0000 mL | INTRAVENOUS | Status: DC | PRN

## 2019-03-07 MED ORDER — GABAPENTIN 800 MG PO TABS
800.0000 mg | ORAL_TABLET | Freq: Two times a day (BID) | ORAL | Status: DC
  Filled 2019-03-07: qty 1

## 2019-03-07 MED ORDER — PROPOFOL 10 MG/ML IV BOLUS
INTRAVENOUS | Status: AC
  Filled 2019-03-07: qty 20

## 2019-03-07 MED ORDER — OXYCODONE HCL 5 MG PO TABS: 5.0000 mg | ORAL_TABLET | ORAL | Status: DC | PRN

## 2019-03-07 MED ORDER — SUCCINYLCHOLINE CHLORIDE 20 MG/ML IJ SOLN
INTRAMUSCULAR | Status: DC | PRN
  Administered 2019-03-07: 100 mg via INTRAVENOUS

## 2019-03-07 MED ORDER — ACETAMINOPHEN 325 MG PO TABS
650.0000 mg | ORAL_TABLET | ORAL | Status: DC | PRN
  Administered 2019-03-08 – 2019-03-09 (×2): 650 mg via ORAL
  Filled 2019-03-07 (×2): qty 2

## 2019-03-07 MED ORDER — GABAPENTIN 400 MG PO CAPS
800.0000 mg | ORAL_CAPSULE | Freq: Two times a day (BID) | ORAL | Status: DC
  Administered 2019-03-07 – 2019-03-08 (×3): 800 mg via ORAL
  Filled 2019-03-07 (×3): qty 2

## 2019-03-07 MED ORDER — ALUM & MAG HYDROXIDE-SIMETH 200-200-20 MG/5ML PO SUSP: 30.0000 mL | Freq: Four times a day (QID) | ORAL | Status: DC | PRN

## 2019-03-07 MED ORDER — LIDOCAINE 2% (20 MG/ML) 5 ML SYRINGE
INTRAMUSCULAR | Status: AC
  Filled 2019-03-07: qty 5

## 2019-03-07 MED ORDER — BUPIVACAINE HCL (PF) 0.5 % IJ SOLN
INTRAMUSCULAR | Status: DC | PRN
  Administered 2019-03-07: 10 mL

## 2019-03-07 MED ORDER — BISACODYL 10 MG RE SUPP: 10.0000 mg | Freq: Every day | RECTAL | Status: DC | PRN

## 2019-03-07 MED ORDER — THROMBIN 5000 UNITS EX SOLR
CUTANEOUS | Status: AC
  Filled 2019-03-07: qty 5000

## 2019-03-07 MED ORDER — PROPOFOL 10 MG/ML IV BOLUS
INTRAVENOUS | Status: DC | PRN
  Administered 2019-03-07: 30 mg via INTRAVENOUS
  Administered 2019-03-07 (×2): 120 mg via INTRAVENOUS

## 2019-03-07 MED ORDER — DEXAMETHASONE SODIUM PHOSPHATE 10 MG/ML IJ SOLN
INTRAMUSCULAR | Status: DC | PRN
  Administered 2019-03-07: 10 mg via INTRAVENOUS

## 2019-03-07 MED ORDER — TAMSULOSIN HCL 0.4 MG PO CAPS
0.4000 mg | ORAL_CAPSULE | Freq: Every day | ORAL | Status: DC
  Administered 2019-03-08: 0.4 mg via ORAL
  Filled 2019-03-07 (×2): qty 1

## 2019-03-07 MED ORDER — EPHEDRINE SULFATE 50 MG/ML IJ SOLN
INTRAMUSCULAR | Status: DC | PRN
  Administered 2019-03-07: 5 mg via INTRAVENOUS

## 2019-03-07 MED ORDER — ACETAMINOPHEN 650 MG RE SUPP: 650.0000 mg | RECTAL | Status: DC | PRN

## 2019-03-07 MED ORDER — 0.9 % SODIUM CHLORIDE (POUR BTL) OPTIME
TOPICAL | Status: DC | PRN
  Administered 2019-03-07: 1000 mL

## 2019-03-07 MED ORDER — AMLODIPINE BESYLATE 5 MG PO TABS
5.0000 mg | ORAL_TABLET | Freq: Every day | ORAL | Status: DC
  Administered 2019-03-08: 5 mg via ORAL
  Filled 2019-03-07: qty 1

## 2019-03-07 MED ORDER — SODIUM CHLORIDE 0.9 % IV SOLN
250.0000 mL | INTRAVENOUS | Status: DC
  Administered 2019-03-07: 250 mL via INTRAVENOUS

## 2019-03-07 SURGICAL SUPPLY — 56 items
BOLT PLATE XLIF 5.5X55 LRG (Bolt) ×2 IMPLANT
BOLT PLATE XLIF 5.5X55MM LRG (Bolt) ×2 IMPLANT
CAGE COROENT XL WIDE 14X22X60M (Cage) ×2 IMPLANT
DECANTER SPIKE VIAL GLASS SM (MISCELLANEOUS) ×3 IMPLANT
DERMABOND ADVANCED (GAUZE/BANDAGES/DRESSINGS) ×2
DERMABOND ADVANCED .7 DNX12 (GAUZE/BANDAGES/DRESSINGS) ×2 IMPLANT
DRAPE C-ARM 42X72 X-RAY (DRAPES) ×3 IMPLANT
DRAPE C-ARMOR (DRAPES) ×3 IMPLANT
DRAPE LAPAROTOMY 100X72X124 (DRAPES) ×3 IMPLANT
DRSG OPSITE POSTOP 3X4 (GAUZE/BANDAGES/DRESSINGS) ×2 IMPLANT
DURAPREP 26ML APPLICATOR (WOUND CARE) ×3 IMPLANT
ELECT REM PT RETURN 9FT ADLT (ELECTROSURGICAL) ×3
ELECTRODE REM PT RTRN 9FT ADLT (ELECTROSURGICAL) ×1 IMPLANT
GLOVE BIO SURGEON STRL SZ8 (GLOVE) ×5 IMPLANT
GLOVE BIOGEL PI IND STRL 6.5 (GLOVE) IMPLANT
GLOVE BIOGEL PI IND STRL 7.0 (GLOVE) IMPLANT
GLOVE BIOGEL PI IND STRL 7.5 (GLOVE) IMPLANT
GLOVE BIOGEL PI IND STRL 8 (GLOVE) ×1 IMPLANT
GLOVE BIOGEL PI IND STRL 8.5 (GLOVE) ×1 IMPLANT
GLOVE BIOGEL PI INDICATOR 6.5 (GLOVE) ×4
GLOVE BIOGEL PI INDICATOR 7.0 (GLOVE) ×4
GLOVE BIOGEL PI INDICATOR 7.5 (GLOVE) ×4
GLOVE BIOGEL PI INDICATOR 8 (GLOVE) ×6
GLOVE BIOGEL PI INDICATOR 8.5 (GLOVE) ×4
GLOVE ECLIPSE 7.5 STRL STRAW (GLOVE) ×6 IMPLANT
GLOVE ECLIPSE 8.0 STRL XLNG CF (GLOVE) ×5 IMPLANT
GLOVE SS N UNI LF 6.5 STRL (GLOVE) ×4 IMPLANT
GLOVE SS N UNI LF 7.0 STRL (GLOVE) ×4 IMPLANT
GOWN STRL REUS W/ TWL LRG LVL3 (GOWN DISPOSABLE) IMPLANT
GOWN STRL REUS W/ TWL XL LVL3 (GOWN DISPOSABLE) ×2 IMPLANT
GOWN STRL REUS W/TWL 2XL LVL3 (GOWN DISPOSABLE) ×4 IMPLANT
GOWN STRL REUS W/TWL LRG LVL3 (GOWN DISPOSABLE) ×4
GOWN STRL REUS W/TWL XL LVL3 (GOWN DISPOSABLE) ×6
HEMOSTAT POWDER KIT SURGIFOAM (HEMOSTASIS) ×2 IMPLANT
KIT BASIN OR (CUSTOM PROCEDURE TRAY) ×3 IMPLANT
KIT DILATOR XLIF 5 (KITS) ×1 IMPLANT
KIT INFUSE XX SMALL 0.7CC (Orthopedic Implant) ×2 IMPLANT
KIT SURGICAL ACCESS MAXCESS 4 (KITS) ×2 IMPLANT
KIT TURNOVER KIT B (KITS) ×3 IMPLANT
KIT XLIF (KITS) ×1
MODULE NVM5 NEXT GEN EMG (NEEDLE) ×2 IMPLANT
NDL HYPO 25X1 1.5 SAFETY (NEEDLE) ×1 IMPLANT
NEEDLE HYPO 25X1 1.5 SAFETY (NEEDLE) ×3 IMPLANT
NS IRRIG 1000ML POUR BTL (IV SOLUTION) ×3 IMPLANT
PACK LAMINECTOMY NEURO (CUSTOM PROCEDURE TRAY) ×3 IMPLANT
PLATE DECADE XLIF 2H SZ14 (Plate) ×2 IMPLANT
PUTTY BONE ATTRAX 5CC STRIP (Putty) ×4 IMPLANT
STAPLER SKIN PROX WIDE 3.9 (STAPLE) ×3 IMPLANT
SUT VIC AB 1 CT1 18XBRD ANBCTR (SUTURE) ×1 IMPLANT
SUT VIC AB 1 CT1 8-18 (SUTURE) ×2
SUT VIC AB 2-0 CT1 18 (SUTURE) ×3 IMPLANT
SUT VIC AB 3-0 SH 8-18 (SUTURE) ×3 IMPLANT
TOWEL GREEN STERILE (TOWEL DISPOSABLE) ×2 IMPLANT
TOWEL GREEN STERILE FF (TOWEL DISPOSABLE) ×2 IMPLANT
TRAY FOLEY MTR SLVR 16FR STAT (SET/KITS/TRAYS/PACK) ×3 IMPLANT
WATER STERILE IRR 1000ML POUR (IV SOLUTION) ×3 IMPLANT

## 2019-03-07 NOTE — Progress Notes (Signed)
Awake, alert, conversant.  Full strength both legs.  No numbness or leg pain.  Quite sore in low back.  Doing well.

## 2019-03-07 NOTE — Evaluation (Signed)
Physical Therapy Evaluation Patient Details Name: Kevin Carlson. MRN: [id number] DOB: [birth year] Today's Date: 03/07/2019   History of Present Illness  Pt is 85 yo male underwent a redo of lumbar laminectomy and discectomy bilaterally L2-3 on 10/27; now s/p ALIF L2-3 due to continued symptoms. PMH including prior total laminactomy, cervical DDD, Bilateral TKA, afib, HTN, and neuropathy.  Clinical Impression  Patient presents with decreased mobility due to spinal precautions and surgical pain.  Feel he will benefit from skilled PT in the acute setting to allow return home with intermittent family/friend support.  Likely no follow up PT needs.     Follow Up Recommendations No PT follow up    Equipment Recommendations  None recommended by PT    Recommendations for Other Services       Precautions / Restrictions Precautions Precautions: Fall;Back Precaution Booklet Issued: Yes (comment) Precaution Comments: wife reports did not wear his brace last time; reviewed precautions with pt and daughter Required Braces or Orthoses: Spinal Brace Spinal Brace: Lumbar corset Restrictions Weight Bearing Restrictions: No      Mobility  Bed Mobility Overal bed mobility: Needs Assistance Bed Mobility: Rolling;Sidelying to Sit Rolling: Supervision Sidelying to sit: Min guard       General bed mobility comments: used rail for side to sit on L side coming up to sit, then to R side to get in bed  Transfers Overall transfer level: Needs assistance Equipment used: Rolling walker (2 wheeled) Transfers: Sit to/from Stand Sit to Stand: Supervision         General transfer comment: S for safety  Ambulation/Gait Ambulation/Gait assistance: Supervision;Min guard Gait Distance (Feet): 400 Feet Assistive device: Rolling walker (2 wheeled) Gait Pattern/deviations: Decreased stride length;Step-through pattern     General Gait Details: using RW since last surgery with limited ambulation  and more flexed posture due to daughter  Science writer    Modified Rankin (Stroke Patients Only)       Balance Overall balance assessment: Needs assistance   Sitting balance-Leahy Scale: Good       Standing balance-Leahy Scale: Fair Standing balance comment: can stand without UE support, walker for ambulation                             Pertinent Vitals/Pain Pain Assessment: Faces Faces Pain Scale: Hurts little more Pain Location: R side Pain Descriptors / Indicators: Operative site guarding;Sore Pain Intervention(s): Monitored during session;Repositioned    Home Living Family/patient expects to be discharged to:: Private residence Living Arrangements: Alone Available Help at Discharge: Family;Friend(s);Available PRN/intermittently Type of Home: House Home Access: Stairs to enter Entrance Stairs-Rails: Right Entrance Stairs-Number of Steps: 1 Home Layout: Two level;Able to live on main level with bedroom/bathroom Home Equipment: Gilford Rile - 2 wheels;Cane - single point;Shower seat Additional Comments: was independent until last surgery, then unable to walk due to pain and bent over    Prior Function Level of Independence: Needs assistance   Gait / Transfers Assistance Needed: was struggling bed to chair since last surgery           Hand Dominance        Extremity/Trunk Assessment   Upper Extremity Assessment Upper Extremity Assessment: Defer to OT evaluation    Lower Extremity Assessment Lower Extremity Assessment: Overall WFL for tasks assessed    Cervical / Trunk Assessment Cervical / Trunk Assessment: Other exceptions Cervical /  Trunk Exceptions: s/p spinal surgery  Communication   Communication: HOH  Cognition Arousal/Alertness: Awake/alert Behavior During Therapy: WFL for tasks assessed/performed Overall Cognitive Status: Within Functional Limits for tasks assessed                                         General Comments      Exercises     Assessment/Plan    PT Assessment Patient needs continued PT services  PT Problem List Decreased balance;Decreased knowledge of use of DME;Pain;Decreased mobility;Decreased strength;Decreased safety awareness       PT Treatment Interventions DME instruction;Gait training;Functional mobility training;Therapeutic activities;Therapeutic exercise;Patient/family education;Balance training    PT Goals (Current goals can be found in the Care Plan section)  Acute Rehab PT Goals Patient Stated Goal: to get around better than last time PT Goal Formulation: With patient Time For Goal Achievement: 03/14/19 Potential to Achieve Goals: Good    Frequency Min 5X/week   Barriers to discharge        Co-evaluation               AM-PAC PT "6 Clicks" Mobility  Outcome Measure Help needed turning from your back to your side while in a flat bed without using bedrails?: None Help needed moving from lying on your back to sitting on the side of a flat bed without using bedrails?: A Little Help needed moving to and from a bed to a chair (including a wheelchair)?: None Help needed standing up from a chair using your arms (e.g., wheelchair or bedside chair)?: A Little Help needed to walk in hospital room?: A Little Help needed climbing 3-5 steps with a railing? : A Little 6 Click Score: 20    End of Session   Activity Tolerance: Patient tolerated treatment well Patient left: in bed;with call bell/phone within reach;with family/visitor present   PT Visit Diagnosis: Difficulty in walking, not elsewhere classified (R26.2)    Time: [id number] PT Time Calculation (min) (ACUTE ONLY): 28 min   Charges:   PT Evaluation $PT Eval Moderate Complexity: 1 Mod PT Treatments $Gait Training: 8-22 mins        Magda Kiel, [place] [phone] 03/07/2019   Reginia Naas 03/07/2019, 5:20 PM

## 2019-03-07 NOTE — Transfer of Care (Signed)
Immediate Anesthesia Transfer of Care Note  Patient: Kevin Carlson.  Procedure(s) Performed: Right Lumbar Two-Three Anterolateral Lumbar Interbody Fusion with Lateral Plate (Right Spine Lumbar)  Patient Location: PACU  Anesthesia Type:General  Level of Consciousness: awake, alert , oriented and patient cooperative  Airway & Oxygen Therapy: Patient Spontanous Breathing and Patient connected to nasal cannula oxygen  Post-op Assessment: Report given to RN and Post -op Vital signs reviewed and stable  Post vital signs: Reviewed and stable  Last Vitals:  Vitals Value Taken Time  BP 151/118 03/07/19 1320  Temp    Pulse 79 03/07/19 1322  Resp 24 03/07/19 1322  SpO2 96 % 03/07/19 1322  Vitals shown include unvalidated device data.  Last Pain:  Vitals:   03/07/19 0840  TempSrc:   PainSc: 8       Patients Stated Pain Goal: 2 ([id number])  Complications: No apparent anesthesia complications

## 2019-03-07 NOTE — Anesthesia Postprocedure Evaluation (Signed)
Anesthesia Post Note  Patient: Kevin Carlson.  Procedure(s) Performed: Right Lumbar Two-Three Anterolateral Lumbar Interbody Fusion with Lateral Plate (Right Spine Lumbar)     Patient location during evaluation: PACU Anesthesia Type: General Level of consciousness: awake and alert and oriented Pain management: pain level controlled Vital Signs Assessment: post-procedure vital signs reviewed and stable Respiratory status: spontaneous breathing, nonlabored ventilation and respiratory function stable Cardiovascular status: blood pressure returned to baseline Postop Assessment: no apparent nausea or vomiting Anesthetic complications: no       Brennan Bailey

## 2019-03-07 NOTE — Brief Op Note (Signed)
03/07/2019  1:17 PM  PATIENT:  Kevin Carlson.  85 y.o. male  PRE-OPERATIVE DIAGNOSIS:  Spinal stenosis, Lumbar region with neurogenic claudication, spondylolisthesis L 23, lumbago, radiculopathy  POST-OPERATIVE DIAGNOSIS:   Spinal stenosis, Lumbar region with neurogenic claudication, spondylolisthesis L 23, lumbago, radiculopathy   PROCEDURE:  Procedure(s) with comments: Right Lumbar Two-Three Anterolateral Lumbar Interbody Fusion with Lateral Plate (Right) - Right Lumbar 2-3 Anterolateral lumbar interbody fusion with lateral plate  SURGEON:  Surgeon(s) and Role:    Erline Levine, MD - Primary  PHYSICIAN ASSISTANT:   ASSISTANTS: [place], RN   ANESTHESIA:   general  EBL: Minimal  BLOOD ADMINISTERED:none  DRAINS: none   LOCAL MEDICATIONS USED:  MARCAINE    and LIDOCAINE   SPECIMEN:  No Specimen  DISPOSITION OF SPECIMEN:  N/A  COUNTS:  YES  TOURNIQUET:  * No tourniquets in log *  DICTATION: Patient is a 85 year old with severe spondylosis stenosis and scoliosis of the lumbar spine with spodylolisthesis. It was elected to take him to surgery for anterolateral decompression and lateral plate fixation at the L 23 level.  He had previously undergone fusion L 2 - S 1 levels.  Procedure: Patient was brought to the operating room and placed in a left lateral decubitus position on the operative table and using orthogonally projected C-arm fluoroscopy the patient was placed so that the L 23  level was visualized in AP and lateral plane. The patient was then taped into position. The table was flexed so as to expose the L 23 level. Skin was marked along with a posterior finger dissection incision. His flank was then prepped and draped in usual sterile fashion and incisions were made underlying the 11 th rib. Posterior finger dissection was made to enter the retroperitoneal space and then subsequently the probe was inserted into the psoas muscle from the right side initially at the L23  level. After mapping the neural elements were able to dock the probe per the midpoint of this vertebral level and without indications electrically of too close proximity to the neural tissues. Subsequently the self-retaining tractor was.after sequential dilators were utilized the shim was employed and the interspace was cleared of psoas muscle and then incised. A thorough discectomy was performed. Instruments were used to clear the interspace of disc material. After thorough discectomy was performed and this was performed using AP and lateral fluoroscopy a 14 lordotic by 60 x 22 mm implant was packed with extra extra small BMP and Attrax. This was tamped into position using the slides and its position was confirmed on AP and lateral fluoroscopy. A Decade lateral plate (size 14) was affixed to the lateral spine with 5.5 x 55 mm screws, one in each vertebra. All screws were torque locked and positioning was confirmed with AP and lateral fluoroscopy. . Hemostasis was assured the wounds were irrigated interrupted Vicryl sutures.Sterile occlusive dressing was placed with Dermabond. The patient was then extubated in the operating room and taken to recovery in stable and satisfactory condition having tolerated his operation well. Counts were correct at the end of the case.  PLAN OF CARE: Admit to inpatient   PATIENT DISPOSITION:  PACU - hemodynamically stable.   Delay start of Pharmacological VTE agent (>24hrs) due to surgical blood loss or risk of bleeding: yes

## 2019-03-07 NOTE — Op Note (Signed)
03/07/2019  1:17 PM  PATIENT:  Kevin Carlson.  85 y.o. male  PRE-OPERATIVE DIAGNOSIS:  Spinal stenosis, Lumbar region with neurogenic claudication, spondylolisthesis L 23, lumbago, radiculopathy  POST-OPERATIVE DIAGNOSIS:   Spinal stenosis, Lumbar region with neurogenic claudication, spondylolisthesis L 23, lumbago, radiculopathy   PROCEDURE:  Procedure(s) with comments: Right Lumbar Two-Three Anterolateral Lumbar Interbody Fusion with Lateral Plate (Right) - Right Lumbar 2-3 Anterolateral lumbar interbody fusion with lateral plate  SURGEON:  Surgeon(s) and Role:    Erline Levine, MD - Primary  PHYSICIAN ASSISTANT:   ASSISTANTS: Poteat, RN   ANESTHESIA:   general  EBL: Minimal  BLOOD ADMINISTERED:none  DRAINS: none   LOCAL MEDICATIONS USED:  MARCAINE    and LIDOCAINE   SPECIMEN:  No Specimen  DISPOSITION OF SPECIMEN:  N/A  COUNTS:  YES  TOURNIQUET:  * No tourniquets in log *  DICTATION: Patient is a 85 year old with severe spondylosis stenosis and scoliosis of the lumbar spine with spodylolisthesis. It was elected to take him to surgery for anterolateral decompression and lateral plate fixation at the L 23 level.  He had previously undergone fusion L 2 - S 1 levels.  Procedure: Patient was brought to the operating room and placed in a left lateral decubitus position on the operative table and using orthogonally projected C-arm fluoroscopy the patient was placed so that the L 23  level was visualized in AP and lateral plane. The patient was then taped into position. The table was flexed so as to expose the L 23 level. Skin was marked along with a posterior finger dissection incision. His flank was then prepped and draped in usual sterile fashion and incisions were made underlying the 11 th rib. Posterior finger dissection was made to enter the retroperitoneal space and then subsequently the probe was inserted into the psoas muscle from the right side initially at the L23  level. After mapping the neural elements were able to dock the probe per the midpoint of this vertebral level and without indications electrically of too close proximity to the neural tissues. Subsequently the self-retaining tractor was.after sequential dilators were utilized the shim was employed and the interspace was cleared of psoas muscle and then incised. A thorough discectomy was performed. Instruments were used to clear the interspace of disc material. After thorough discectomy was performed and this was performed using AP and lateral fluoroscopy a 14 lordotic by 60 x 22 mm implant was packed with extra extra small BMP and Attrax. This was tamped into position using the slides and its position was confirmed on AP and lateral fluoroscopy. A Decade lateral plate (size 14) was affixed to the lateral spine with 5.5 x 55 mm screws, one in each vertebra. All screws were torque locked and positioning was confirmed with AP and lateral fluoroscopy. . Hemostasis was assured the wounds were irrigated interrupted Vicryl sutures.Sterile occlusive dressing was placed with Dermabond. The patient was then extubated in the operating room and taken to recovery in stable and satisfactory condition having tolerated his operation well. Counts were correct at the end of the case.  PLAN OF CARE: Admit to inpatient   PATIENT DISPOSITION:  PACU - hemodynamically stable.   Delay start of Pharmacological VTE agent (>24hrs) due to surgical blood loss or risk of bleeding: yes

## 2019-03-07 NOTE — Interval H&P Note (Signed)
History and Physical Interval Note:  03/07/2019 10:40 AM  Kevin Carlson.  has presented today for surgery, with the diagnosis of Spinal stenosis, Lumbar region with neurogenic claudication.  The various methods of treatment have been discussed with the patient and family. After consideration of risks, benefits and other options for treatment, the patient has consented to  Procedure(s) with comments: Right Lumbar 2-3 Anterolateral lumbar interbody fusion with lateral plate (Right) - Right Lumbar 2-3 Anterolateral lumbar interbody fusion with lateral plate as a surgical intervention.  The patient's history has been reviewed, patient examined, no change in status, stable for surgery.  I have reviewed the patient's chart and labs.  Questions were answered to the patient's satisfaction.     [name]

## 2019-03-08 MED FILL — Sodium Chloride IV Soln 0.9%: INTRAVENOUS | Qty: 1000 | Status: AC

## 2019-03-08 MED FILL — Heparin Sodium (Porcine) Inj 1000 Unit/ML: INTRAMUSCULAR | Qty: 30 | Status: AC

## 2019-03-08 NOTE — Progress Notes (Signed)
Physical Therapy Treatment and Discharge Patient Details Name: Kevin Carlson. MRN: [id number] DOB: 1929/08/28 Today's Date: 03/08/2019    History of Present Illness Pt is 85 yo male underwent a redo of lumbar laminectomy and discectomy bilaterally L2-3 on 10/27; now s/p ALIF L2-3 due to continued symptoms. PMH including prior total laminactomy, cervical DDD, Bilateral TKA, afib, HTN, and neuropathy.    PT Comments    Pt progressing well with post-op mobility. He was able to demonstrate transfers and ambulation progressing to mod I by end of session with RW for support. Education reinforced on precautions, brace application/wearing schedule, appropriate activity progression, and car transfer. Pt has met all acute PT goals and we will sign off at this time. If needs change, please reconsult.    Follow Up Recommendations  No PT follow up     Equipment Recommendations  None recommended by PT    Recommendations for Other Services       Precautions / Restrictions Precautions Precautions: Fall;Back Precaution Booklet Issued: Yes (comment) Precaution Comments: Pt handout already provided. Pt able to state 3/3 precautions Spinal Brace Comments: Per RN, MD said brace is optional as pt will not wear it at home.  Restrictions Weight Bearing Restrictions: No    Mobility  Bed Mobility Overal bed mobility: Needs Assistance Bed Mobility: Rolling;Sidelying to Sit;Sit to Sidelying Rolling: Supervision Sidelying to sit: Supervision     Sit to sidelying: Supervision General bed mobility comments: Pt was received sitting up on EOB.   Transfers Overall transfer level: Needs assistance Equipment used: Rolling walker (2 wheeled) Transfers: Sit to/from Stand Sit to Stand: Supervision;Modified independent (Device/Increase time)         General transfer comment: Progressed to mod I by end of session  Ambulation/Gait Ambulation/Gait assistance: Supervision;Modified independent  (Device/Increase time) Gait Distance (Feet): 300 Feet Assistive device: Rolling walker (2 wheeled) Gait Pattern/deviations: Decreased stride length;Step-through pattern Gait velocity: Decreased progressing to [facility] for age Gait velocity interpretation: 1.31 - 2.62 ft/sec, indicative of limited community ambulator General Gait Details: Initially slower and appeared guarded however by end of gait training pt had progressed to a higher gait speed and was overall mod I with RW.    Stairs             Wheelchair Mobility    Modified Rankin (Stroke Patients Only)       Balance Overall balance assessment: Needs assistance Sitting-balance support: No upper extremity supported;Feet supported Sitting balance-Leahy Scale: Good     Standing balance support: During functional activity;No upper extremity supported Standing balance-Leahy Scale: Fair Standing balance comment: RW for ambulation; stood at sink for grooming with no AD                            Cognition Arousal/Alertness: Awake/alert Behavior During Therapy: WFL for tasks assessed/performed Overall Cognitive Status: Within Functional Limits for tasks assessed                                 General Comments: Pt able to state and adhere to back precautions. Pt following all commands well      Exercises      General Comments General comments (skin integrity, edema, etc.): Education provided for AE. Pt able to perform figure 4 technique with LB ADL. Pt has reacher at home for assistance with LB needs. Education for general back precautions.  Pertinent Vitals/Pain Pain Assessment: Faces Pain Score: 4  Faces Pain Scale: Hurts a little bit Pain Location: R side Pain Descriptors / Indicators: Operative site guarding;Sore Pain Intervention(s): Monitored during session    Home Living Family/patient expects to be discharged to:: Private residence Living Arrangements: Alone Available Help at  Discharge: Family;Friend(s);Available PRN/intermittently Type of Home: House Home Access: Stairs to enter Entrance Stairs-Rails: Right Home Layout: Two level;Able to live on main level with bedroom/bathroom Home Equipment: Gilford Rile - 2 wheels;Cane - single point;Shower seat Additional Comments: was independent until last surgery, then unable to walk due to pain and bent over    Prior Function Level of Independence: Needs assistance  Gait / Transfers Assistance Needed: was struggling bed to chair since last surgery ADL's / Homemaking Assistance Needed: Pt reports difficulty and increased time with LB ADL     PT Goals (current goals can now be found in the care plan section) Acute Rehab PT Goals Patient Stated Goal: to get around better than last time; no more surgery PT Goal Formulation: With patient Time For Goal Achievement: 03/14/19 Potential to Achieve Goals: Good Progress towards PT goals: Progressing toward goals    Frequency    Min 5X/week      PT Plan Current plan remains appropriate    Co-evaluation              AM-PAC PT "6 Clicks" Mobility   Outcome Measure  Help needed turning from your back to your side while in a flat bed without using bedrails?: None Help needed moving from lying on your back to sitting on the side of a flat bed without using bedrails?: None Help needed moving to and from a bed to a chair (including a wheelchair)?: None Help needed standing up from a chair using your arms (e.g., wheelchair or bedside chair)?: None Help needed to walk in hospital room?: None Help needed climbing 3-5 steps with a railing? : A Little 6 Click Score: 23    End of Session Equipment Utilized During Treatment: Gait belt Activity Tolerance: Patient tolerated treatment well Patient left: in bed;with call bell/phone within reach;with family/visitor present Nurse Communication: Mobility status PT Visit Diagnosis: Difficulty in walking, not elsewhere classified  (R26.2) Pain - part of body: (back)     Time: 0063-4949 PT Time Calculation (min) (ACUTE ONLY): 15 min  Charges:  $Gait Training: 8-22 mins                     Rolinda Roan, PT, DPT Acute Rehabilitation Services Pager: [phone] Office: [phone]    Thelma Comp 03/08/2019, 10:06 AM

## 2019-03-08 NOTE — Evaluation (Signed)
Occupational Therapy Evaluation Patient Details Name: Kevin Carlson. MRN: [id number] DOB: 05-17-1929 Today's Date: 03/08/2019    History of Present Illness Pt is 85 yo male underwent a redo of lumbar laminectomy and discectomy bilaterally L2-3 on 10/27; now s/p ALIF L2-3 due to continued symptoms. PMH including prior total laminactomy, cervical DDD, Bilateral TKA, afib, HTN, and neuropathy.   Clinical Impression   Pt PTA:  Pt living alone, family is supportive. Pt with increased pain and difficulty with mobility. Pt currently performing ADL and mobility with RW with supervisionA. Pt stood at sink for UB ADL with supervisionA; pt donning pants with no difficulty using figure 4 technique for LB dressing with no cues for adhering to back precautions. Pt tolerating session well. Education provided for AE. Pt has reacher at home for assistance with LB needs.  Pt stating back precautions. Pt education to set an alarm at night for medication, avoid sitting for long periods of time, correct bed positioning for sleeping, correct sequence for bed mobility, avoiding lifting more than 5 pounds and never wash directly over incision. All education is complete and patient indicates understanding. Pt does not require continued OT skilled services. Pt would benefit from supervision initially for 24/7. OT signing off.      Follow Up Recommendations  No OT follow up;Supervision/Assistance - 24 hour(initially)    Equipment Recommendations  None recommended by OT    Recommendations for Other Services       Precautions / Restrictions Precautions Precautions: Fall;Back Precaution Booklet Issued: Yes (comment) Precaution Comments: Pt handout already provided. Pt able to state 3/3 precautions Spinal Brace Comments: Daughter is bringing brace from home Restrictions Weight Bearing Restrictions: No      Mobility Bed Mobility Overal bed mobility: Needs Assistance Bed Mobility: Rolling;Sidelying to  Sit;Sit to Sidelying Rolling: Supervision Sidelying to sit: Supervision     Sit to sidelying: Supervision General bed mobility comments: Use of rail on R side to sit EOB  Transfers Overall transfer level: Needs assistance Equipment used: Rolling walker (2 wheeled) Transfers: Sit to/from Stand Sit to Stand: Supervision         General transfer comment: S for safety    Balance Overall balance assessment: Needs assistance   Sitting balance-Leahy Scale: Good     Standing balance support: During functional activity;No upper extremity supported Standing balance-Leahy Scale: Fair Standing balance comment: RW for ambulation; stood at sink for grooming with no AD                           ADL either performed or assessed with clinical judgement   ADL Overall ADL's : Needs assistance/impaired Eating/Feeding: Independent;Sitting   Grooming: Supervision/safety;Standing   Upper Body Bathing: Supervision/ safety;Standing   Lower Body Bathing: Supervison/ safety;Sitting/lateral leans;Sit to/from stand   Upper Body Dressing : Supervision/safety;Sitting   Lower Body Dressing: Supervision/safety;Sitting/lateral leans;Sit to/from stand   Toilet Transfer: Supervision/safety;Ambulation;Cueing for safety   Toileting- Clothing Manipulation and Hygiene: Supervision/safety;Sitting/lateral lean;Sit to/from stand       Functional mobility during ADLs: Supervision/safety;Rolling walker General ADL Comments: Pt stood at sink for UB ADL with supervisionA; pt donning pants with no difficulty using figure 4 technique for LB dressing with no cues for adhering to back precautions. pt tolerating session well.     Vision Baseline Vision/History: Wears glasses Wears Glasses: At all times Patient Visual Report: No change from baseline Vision Assessment?: No apparent visual deficits     Perception  Praxis      Pertinent Vitals/Pain Pain Assessment: 0-10 Pain Score: 4  Pain  Location: R side Pain Descriptors / Indicators: Operative site guarding;Sore Pain Intervention(s): Monitored during session     Hand Dominance Right   Extremity/Trunk Assessment Upper Extremity Assessment Upper Extremity Assessment: Overall WFL for tasks assessed   Lower Extremity Assessment Lower Extremity Assessment: Defer to PT evaluation;Overall [facility] for tasks assessed   Cervical / Trunk Assessment Cervical / Trunk Assessment: Other exceptions Cervical / Trunk Exceptions: s/p spinal surgery   Communication Communication Communication: HOH   Cognition Arousal/Alertness: Awake/alert Behavior During Therapy: WFL for tasks assessed/performed Overall Cognitive Status: Within Functional Limits for tasks assessed                                 General Comments: Pt able to state and adhere to back precautions. Pt following all commands well   General Comments  Education provided for AE. Pt able to perform figure 4 technique with LB ADL. Pt has reacher at home for assistance with LB needs. Education for general back precautions.    Exercises     Shoulder Instructions      Home Living Family/patient expects to be discharged to:: Private residence Living Arrangements: Alone Available Help at Discharge: Family;Friend(s);Available PRN/intermittently Type of Home: House Home Access: Stairs to enter [employer] of Steps: 1 Entrance Stairs-Rails: Right Home Layout: Two level;Able to live on main level with bedroom/bathroom   Alternate Level Stairs-Rails: Left Bathroom Shower/Tub: Walk-in shower   Bathroom Toilet: Handicapped height     Home Equipment: Environmental consultant - 2 wheels;Cane - single point;Shower seat   Additional Comments: was independent until last surgery, then unable to walk due to pain and bent over      Prior Functioning/Environment Level of Independence: Needs assistance  Gait / Transfers Assistance Needed: was struggling bed to chair since  last surgery ADL's / Homemaking Assistance Needed: Pt reports difficulty and increased time with LB ADL            OT Problem List: Pain      OT Treatment/Interventions:      OT Goals(Current goals can be found in the care plan section)    OT Frequency:     Barriers to D/C:            Co-evaluation              AM-PAC OT "6 Clicks" Daily Activity     Outcome Measure Help from another person eating meals?: None Help from another person taking care of personal grooming?: None Help from another person toileting, which includes using toliet, bedpan, or urinal?: None Help from another person bathing (including washing, rinsing, drying)?: A Little Help from another person to put on and taking off regular upper body clothing?: None Help from another person to put on and taking off regular lower body clothing?: None 6 Click Score: 23   End of Session Equipment Utilized During Treatment: Rolling walker Nurse Communication: Mobility status  Activity Tolerance: Patient tolerated treatment well Patient left: in bed;with call bell/phone within reach  OT Visit Diagnosis: Unsteadiness on feet (R26.81);Pain Pain - Right/Left: Right Pain - part of body: (back)                Time: [id number] OT Time Calculation (min): 20 min Charges:  OT General Charges $OT Visit: 1 Visit OT Evaluation $OT Eval Moderate Complexity: 1 Mod  Ebony Hail Harold Hedge) Marsa Aris OTR/L Acute Rehabilitation Services Pager: [phone] Office: [place] 03/08/2019, 7:58 AM

## 2019-03-08 NOTE — Progress Notes (Addendum)
Subjective: Patient reports "I;'m doing good. This leg is better"  Objective: Vital signs in last 24 hours: Temp:  [97 F (36.1 C)-98.4 F (36.9 C)] 98.2 F (36.8 C) (11/06 0810) Pulse Rate:  [57-91] 67 (11/06 0810) Resp:  [9-25] 16 (11/06 0810) BP: (101-184)/(62-118) 105/62 (11/06 0810) SpO2:  [95 %-100 %] 99 % (11/06 0810)  Intake/Output from previous day: 11/05 0701 - 11/06 0700 In: 1240 [P.O.:340; I.V.:800; IV Piggyback:100] Out: 1825 [Urine:1625; Blood:200] Intake/Output this shift: Total I/O In: 480 [P.O.:480] Out: -   Alert, conversant, reporting significantly decreased right thigh pain. Good strength BLE. Incisions flat without erythema or drainage beneath honeycomb and Dermabond. Ambulated in hallway.   Lab Results: Recent Labs    03/07/19 0807  WBC 15.6*  HGB 9.8*  HCT 30.8*  PLT 313   BMET Recent Labs    03/07/19 0807  NA 138  K 4.3  CL 106  CO2 24  GLUCOSE 92  BUN 23  CREATININE 1.03  CALCIUM 8.8*    Studies/Results: Dg Lumbar Spine 2-3 Views  Result Date: 03/07/2019 CLINICAL DATA:  Lumbar fusion. EXAM: DG C-ARM 1-60 MIN; LUMBAR SPINE - 2-3 VIEW CONTRAST:  None. FLUOROSCOPY TIME:  Fluoroscopy Time:  1 minutes 58 seconds Number of Acquired Spot Images: 6 COMPARISON:  03/04/2019. FINDINGS: Postsurgical changes lumbar spine. Visualized hardware intact. Diffuse degenerative change. No acute abnormality. IMPRESSION: Postsurgical changes lumbar spine. Electronically Signed   By: Marcello Moores  Register   On: 03/07/2019 12:59   Dg C-arm 1-60 Min  Result Date: 03/07/2019 CLINICAL DATA:  Lumbar fusion. EXAM: DG C-ARM 1-60 MIN; LUMBAR SPINE - 2-3 VIEW CONTRAST:  None. FLUOROSCOPY TIME:  Fluoroscopy Time:  1 minutes 58 seconds Number of Acquired Spot Images: 6 COMPARISON:  03/04/2019. FINDINGS: Postsurgical changes lumbar spine. Visualized hardware intact. Diffuse degenerative change. No acute abnormality. IMPRESSION: Postsurgical changes lumbar spine. Electronically  Signed   By: Marcello Moores  Register   On: 03/07/2019 12:59    Assessment/Plan: improving  LOS: 1 day  Continue to mobilize today. Expect d/c to home in am. May resume Eliquis 5 days post-op. May remove dressings at home and resume showers.   Verdis Prime 03/08/2019, 9:50 AM   Patient is progressing well.  Anticipate discharge in AM.

## 2019-03-09 MED ORDER — OXYCODONE HCL 10 MG PO TABS: 10.0000 mg | ORAL_TABLET | Freq: Four times a day (QID) | ORAL | 0 refills | Status: AC | PRN

## 2019-03-09 MED ORDER — METHOCARBAMOL 500 MG PO TABS: 500.0000 mg | ORAL_TABLET | Freq: Four times a day (QID) | ORAL | 0 refills | Status: DC | PRN

## 2019-03-09 NOTE — Plan of Care (Signed)
Patient alert and oriented, mae's well, voiding adequate amount of urine, swallowing without difficulty, no c/o pain at time of discharge. Patient discharged home with family. Script and discharged instructions given to patient. Patient and family stated understanding of instructions given. Patient has an appointment with Dr.Stern    

## 2019-03-09 NOTE — Discharge Instructions (Signed)

## 2019-03-09 NOTE — Discharge Summary (Signed)
Physician Discharge Summary  Patient ID: Kevin Carlson. MRN: [id number] DOB/AGE: 1930-03-15 85 y.o.  Admit date: 03/07/2019 Discharge date: 03/09/2019  Admission Diagnoses:Spinal stenosis, Lumbar region with neurogenic claudication, spondylolisthesis L 23, lumbago, radiculopathy   Discharge Diagnoses: Spinal stenosis, Lumbar region with neurogenic claudication, spondylolisthesis L 23, lumbago, radiculopathy   Active Problems:   Spondylolisthesis of lumbar region   Discharged Condition: good  Hospital Course: Kevin Carlson was admitted and taken to the operating room for a lumbar decompression and arthrodesis. Post op he is ambulating, voiding, and tolerating a regular diet. His wound is clean, dry, and without signs of infection.   Treatments: surgery: Right Lumbar Two-Three Anterolateral Lumbar Interbody Fusion with Lateral Plate (Right) - Right Lumbar 2-3 Anterolateral lumbar interbody fusion with lateral plate     Discharge Exam: Blood pressure (!) 147/65, pulse 68, temperature 98.1 F (36.7 C), temperature source Oral, resp. rate 16, height 6' (1.829 m), weight 80.7 kg, SpO2 98 %. General appearance: alert, cooperative, appears stated age and mild distress  Disposition: Discharge disposition: 01-Home or Self Care      Spinal stenosis, Lumbar region with neurogenic claudication Discharge Instructions    Incentive spirometry RT   Complete by: As directed      Allergies as of 03/09/2019   No Known Allergies     Medication List    TAKE these medications   amLODipine-benazepril 5-10 MG capsule Commonly known as: LOTREL Take 1 capsule by mouth daily.   diphenhydramine-acetaminophen 25-500 MG Tabs tablet Commonly known as: TYLENOL PM Take 2 tablets by mouth at bedtime.   Eliquis 5 MG Tabs tablet Generic drug: apixaban Take 5 mg by mouth 2 (two) times daily.   ergocalciferol 1.25 MG (50000 UT) capsule Commonly known as: VITAMIN D2 Take 50,000 Units by mouth  every Monday.   gabapentin 800 MG tablet Commonly known as: NEURONTIN Take 800 mg by mouth 2 (two) times daily.   guaiFENesin 600 MG 12 hr tablet Commonly known as: MUCINEX Take 600 mg by mouth 2 (two) times daily as needed (congestion).   HYDROcodone-acetaminophen 10-325 MG tablet Commonly known as: NORCO Take 1 tablet by mouth every 6 (six) hours as needed for moderate pain.   lansoprazole 30 MG capsule Commonly known as: PREVACID Take 30 mg by mouth daily.   methocarbamol 500 MG tablet Commonly known as: ROBAXIN Take 1 tablet (500 mg total) by mouth every 6 (six) hours as needed for muscle spasms. What changed: Another medication with the same name was added. Make sure you understand how and when to take each.   methocarbamol 500 MG tablet Commonly known as: ROBAXIN Take 1 tablet (500 mg total) by mouth every 6 (six) hours as needed for muscle spasms. What changed: You were already taking a medication with the same name, and this prescription was added. Make sure you understand how and when to take each.   metoprolol succinate 25 MG 24 hr tablet Commonly known as: TOPROL-XL Take 25 mg by mouth daily.   Oxycodone HCl 10 MG Tabs Take 1 tablet (10 mg total) by mouth every 6 (six) hours as needed for up to 7 days for severe pain or breakthrough pain ((score 4 to 6)).   predniSONE 5 MG tablet Commonly known as: DELTASONE Take 5 mg by mouth daily with breakfast.   simvastatin 40 MG tablet Commonly known as: ZOCOR Take 40 mg by mouth daily.   sodium chloride 0.65 % Soln nasal spray Commonly known as: OCEAN Place 1  spray into both nostrils at bedtime as needed for congestion.   tamsulosin 0.4 MG Caps capsule Commonly known as: FLOMAX Take 0.4 mg by mouth daily.   tiZANidine 4 MG tablet Commonly known as: ZANAFLEX Take 4 mg by mouth at bedtime.      Follow-up Information    Erline Levine, MD Follow up in 3 week(s).   Specialty: Neurosurgery Why: please call to  make an appointment Contact information: [address] [place] [address] [place] [address] [phone]           Signed: Ashok Pall 03/09/2019, 7:54 AM

## 2019-03-11 ENCOUNTER — Encounter (HOSPITAL_COMMUNITY): Payer: Self-pay | Admitting: Neurosurgery

## 2020-12-03 ENCOUNTER — Encounter (INDEPENDENT_AMBULATORY_CARE_PROVIDER_SITE_OTHER): Payer: Self-pay

## 2020-12-03 ENCOUNTER — Ambulatory Visit (INDEPENDENT_AMBULATORY_CARE_PROVIDER_SITE_OTHER): Payer: Medicare Other | Admitting: Gastroenterology

## 2020-12-03 ENCOUNTER — Encounter (INDEPENDENT_AMBULATORY_CARE_PROVIDER_SITE_OTHER): Payer: Self-pay | Admitting: Gastroenterology

## 2020-12-03 ENCOUNTER — Other Ambulatory Visit: Payer: Self-pay

## 2020-12-03 ENCOUNTER — Other Ambulatory Visit (INDEPENDENT_AMBULATORY_CARE_PROVIDER_SITE_OTHER): Payer: Self-pay

## 2020-12-03 VITALS — BP 100/62 | HR 101 | Temp 98.2°F | Ht 71.0 in | Wt 148.6 lb

## 2020-12-03 DIAGNOSIS — K219 Gastro-esophageal reflux disease without esophagitis: Secondary | ICD-10-CM | POA: Diagnosis not present

## 2020-12-03 DIAGNOSIS — R140 Abdominal distension (gaseous): Secondary | ICD-10-CM | POA: Diagnosis not present

## 2020-12-03 DIAGNOSIS — Z79891 Long term (current) use of opiate analgesic: Secondary | ICD-10-CM | POA: Insufficient documentation

## 2020-12-03 DIAGNOSIS — R142 Eructation: Secondary | ICD-10-CM

## 2020-12-03 DIAGNOSIS — R110 Nausea: Secondary | ICD-10-CM

## 2020-12-03 DIAGNOSIS — R634 Abnormal weight loss: Secondary | ICD-10-CM | POA: Insufficient documentation

## 2020-12-03 MED ORDER — ONDANSETRON HCL 4 MG PO TABS: 4.0000 mg | ORAL_TABLET | Freq: Three times a day (TID) | ORAL | 2 refills | Status: DC | PRN

## 2020-12-03 NOTE — H&P (View-Only) (Signed)
[name], M.D. Gastroenterology & Hepatology [facility] [address] [place], [place] [address]  Primary Care Physician: Kennieth Rad, MD [facility] [address] [place] [address]  I will communicate my assessment and recommendations to the referring MD via EMR.  Problems: Nausea GERD Short segment Barrett's esophagus Chronic opiate use  History of Present Illness: Kevin Carlson. is a 87 y.o. male with past medical history of arthritis, BPH, skin cancer, atrial fibrillation on anticoagulation, GERD complicated by short segment Barrett's esophagus, hyperlipidemia, hypertension, polymyalgia rheumatica and chronic use of opiates, who presents for follow up of nausea.  The patient was last seen on 12/07/2017. At that time, the patient was being evaluated for an positive stool, had stool testing x3 negative.  Patient comes to the office with his daughter who also helps providing any information..  Patient reports that for the last 6 months he has presented recurrent episodes of belching, which became worse for the last month as he has presented recurrent episodes on a daily basis, usually after eating. He also endorses significant bloating in his abdomen and poor appetite. Denies any abdominal pain, no hematochezia. States that his stools are darker as he has been taking Peptobismol to improve his nausea which slightly helps the nausea. He reports having frequent nausea during the day but no vomiting.  States that the nausea has also led to decreased appetite   Patient used to weigh 160 lb 6 months ago but due to poor oral intake he has lost close to 12 lb.   Takes Prevacid every day, which control his GERD episodes adequately.  He has been taking his medication compliantly and denies having any side effects with it.  Denies any heartburn, dysphagia or odynophagia.  Patient has been Norco for rheumatoid  arthritis and back pain for multiple years.  Denies drinking or smoking since 1976. Brother had prostate cancer.  The patient denies having any fever, chills, hematochezia, melena, hematemesis, abdominal pain, diarrhea, jaundice, pruritus.  Last EGD: 11/18/2016, short segment Barrett's esophagus with maximal disease of 1cm (biopsies consistent with Barrett's esophagus, short segment), 4 cm hiatal hernia, normal stomach, normal duodenum. Last Colonoscopy: 11/18/2016, 3 7 to 12 mm polyps were located in the cecum which were removed with piecemeal hot snare.  2 hemostatic clips were placed.  There was present diverticulosis.  External hemorrhoids.  Pathology consistent with tubular adenomas. Patient developed post polypectomy bleeding that was monitored in the hospital overnight without requirement of any intubation but required transfusion of 2 units.   Past Medical History: Past Medical History:  Diagnosis Date   Anemia    Arthritis    BPH (benign prostatic hyperplasia)    Cancer ([place])    Skin Cancer- face   Complication of anesthesia 2011   " bleeding from Esophagus after extubated, had to have an endo   Constipation due to opioid therapy    no current problems per patient 03/06/19   DDD (degenerative disc disease), cervical    Dysrhythmia    afib   GERD (gastroesophageal reflux disease)    History of blood transfusion    History of hiatal hernia    History of kidney stones    passed   Hyperlipemia    Hypertension    Nasal congestion    Neuropathy    pt denies this dx 03/06/19   Osteopenia    Pneumonia    approx 2015   Polymyalgia rheumatica ([place])  Past Surgical History: Past Surgical History:  Procedure Laterality Date   ANTERIOR LAT LUMBAR FUSION Right 03/07/2019   Procedure: Right Lumbar Two-Three Anterolateral Lumbar Interbody Fusion with Lateral Plate;  Surgeon: Erline Levine, MD;  Location: [place];  Service: Neurosurgery;  Laterality: Right;  Right Lumbar 2-3  Anterolateral lumbar interbody fusion with lateral plate   APPENDECTOMY  1951   BACK SURGERY     x2   BIOPSY  11/18/2016   Procedure: BIOPSY;  Surgeon: Rogene Houston, MD;  [facility];  Service: Endoscopy;;  esophagus   CARDIAC CATHETERIZATION     yrs ago, no blockages per patient    [place]   after appendectomy   COLONOSCOPY N/A 11/18/2016   Procedure: COLONOSCOPY;  Surgeon: Rogene Houston, MD;  [facility];  Service: Endoscopy;  Laterality: N/A;   COLONOSCOPY W/ POLYPECTOMY     ESOPHAGOGASTRODUODENOSCOPY N/A 11/18/2016   Procedure: ESOPHAGOGASTRODUODENOSCOPY (EGD);  Surgeon: Rogene Houston, MD;  [facility];  Service: Endoscopy;  Laterality: N/A;  12:10   EYE SURGERY Bilateral    cataract   FINGER SURGERY Right    index- pinned   HERNIA REPAIR Right    HERNIA REPAIR Right 1948   KNEE SURGERY Right 1974   "cleaned it out" laparoscopy   LUMBAR LAMINECTOMY/DECOMPRESSION MICRODISCECTOMY N/A 03/20/2015   Procedure: Decompressive lumbar laminectomy L3-L5 ;  Surgeon: Erline Levine, MD;  Location: [place] [facility];  Service: Neurosurgery;  Laterality: N/A;   LUMBAR LAMINECTOMY/DECOMPRESSION MICRODISCECTOMY Bilateral 02/26/2019   Procedure: REDO LUMBAR LAMINECTOMY AND DISCECTOMY BILATERALLY LUMBAR TWO- LUMBAR THREE RIGHT WITH REVISION OF LUMBAR TWO-THREE FUSION WITH POSTEROLATERAL ARTHRODESIS;  Surgeon: Erline Levine, MD;  Location: [place];  Service: Neurosurgery;  Laterality: Bilateral;   POLYPECTOMY  11/18/2016   Procedure: POLYPECTOMY;  Surgeon: Rogene Houston, MD;  [facility];  Service: Endoscopy;;  colon    REPLACEMENT TOTAL KNEE Left 2011   REPLACEMENT TOTAL KNEE Right 2006   TONSILLECTOMY      Family History: Family History  Problem Relation Age of Onset   Hypertension Mother    Hypertension Father    Stroke Father    Heart failure Father    Colon cancer Neg Hx     Social  History: Social History   Tobacco Use  Smoking Status Former   Years: 25.00   Types: Cigarettes   Quit date: 05/02/1974   Years since quitting: 46.6  Smokeless Tobacco Never  Tobacco Comments   quit smoking in 1976   Social History   Substance and Sexual Activity  Alcohol Use No   Comment: None since 1999   Social History   Substance and Sexual Activity  Drug Use No    Allergies: No Known Allergies  Medications: Current Outpatient Medications  Medication Sig Dispense Refill   albuterol (ACCUNEB) 1.25 MG/3ML nebulizer solution Take 1 ampule by nebulization every 6 (six) hours as needed for wheezing.     amLODipine-benazepril (LOTREL) 5-10 MG capsule Take 1 capsule by mouth daily.     apixaban (ELIQUIS) 5 MG TABS tablet Take 5 mg by mouth 2 (two) times daily.     diphenhydramine-acetaminophen (TYLENOL PM) 25-500 MG TABS tablet Take 2 tablets by mouth at bedtime as needed (sleep).     ergocalciferol (VITAMIN D2) 1.25 MG (50000 UT) capsule Take 50,000 Units by mouth every Monday.     gabapentin (NEURONTIN) 800 MG tablet Take  800 mg by mouth 2 (two) times daily.     guaiFENesin (MUCINEX) 600 MG 12 hr tablet Take 600 mg by mouth 2 (two) times daily as needed (congestion).     HYDROcodone-acetaminophen (NORCO) 10-325 MG tablet Take 1 tablet by mouth every 6 (six) hours as needed for moderate pain. 60 tablet 0   lansoprazole (PREVACID) 30 MG capsule Take 30 mg by mouth daily.      methocarbamol (ROBAXIN) 500 MG tablet Take 1 tablet (500 mg total) by mouth every 6 (six) hours as needed for muscle spasms. 60 tablet 1   metoprolol succinate (TOPROL-XL) 25 MG 24 hr tablet Take 25 mg by mouth daily.     predniSONE (DELTASONE) 5 MG tablet Take 5 mg by mouth daily with breakfast.     simvastatin (ZOCOR) 40 MG tablet Take 40 mg by mouth daily.     sodium chloride (OCEAN) 0.65 % SOLN nasal spray Place 1 spray into both nostrils at bedtime as needed for congestion.     tamsulosin (FLOMAX)  0.4 MG CAPS capsule Take 0.4 mg by mouth daily.      tiZANidine (ZANAFLEX) 4 MG tablet Take 4 mg by mouth at bedtime.      No current facility-administered medications for this visit.    Review of Systems: GENERAL: negative for malaise, night sweats HEENT: No changes in hearing or vision, no nose bleeds or other nasal problems. NECK: Negative for lumps, goiter, pain and significant neck swelling RESPIRATORY: Negative for cough, wheezing CARDIOVASCULAR: Negative for chest pain, leg swelling, palpitations, orthopnea GI: SEE HPI MUSCULOSKELETAL: Negative for joint pain or swelling, back pain, and muscle pain. SKIN: Negative for lesions, rash PSYCH: Negative for sleep disturbance, mood disorder and recent psychosocial stressors. HEMATOLOGY Negative for prolonged bleeding, bruising easily, and swollen nodes. ENDOCRINE: Negative for cold or heat intolerance, polyuria, polydipsia and goiter. NEURO: negative for tremor, gait imbalance, syncope and seizures. The remainder of the review of systems is noncontributory.   Physical Exam: BP 100/62 (BP Location: Left Arm, Patient Position: Sitting, Cuff Size: Small)   Pulse (!) 101   Temp 98.2 F (36.8 C) (Oral)   Ht '5\' 11"'$  (1.803 m)   Wt 148 lb 9.6 oz (67.4 kg)   BMI 20.73 kg/m  GENERAL: The patient is AO x3, in no acute distress. Sitting in wheelchair. HEENT: Head is normocephalic and atraumatic. EOMI are intact. Mouth is well hydrated and without lesions. NECK: Supple. No masses LUNGS: Clear to auscultation. No presence of rhonchi/wheezing/rales. Adequate chest expansion HEART: RRR, normal s1 and s2. ABDOMEN: Soft, nontender but with discomfort upon epigastric palpation, no guarding, no peritoneal signs, and nondistended. BS +. No masses. EXTREMITIES: Without any cyanosis, clubbing, rash, lesions or edema. NEUROLOGIC: AOx3, no focal motor deficit. SKIN: no jaundice, no rashes  Imaging/Labs: as above  I personally reviewed and  interpreted the available labs, imaging and endoscopic files.  Impression and Plan: Kevin Carlson. is a 87 y.o. male with past medical history of arthritis, BPH, skin cancer, atrial fibrillation on anticoagulation, GERD complicated by short segment Barrett's esophagus, hyperlipidemia, hypertension, polymyalgia rheumatica and chronic use of opiates, who presents for follow up of nausea and bloating.  The patient has presented worsening symptoms which have actually been concerning due to the significant weight loss due to appetite changes he has presented.  Given his age and significant unintentional weight loss we will schedule an EGD to evaluate this further.  If this is unremarkable, we we will need  to consider performing cross-sectional abdominal imaging.  I explained to the patient and his daughter that it is possible his chronic opiate intake is causing his symptomatology, ideally he should decrease the intake of these medications.  For now he can continue taking Zofran as needed. In terms of his acid reflux, this is well controlled on Prevacid and he should continue taking the same dose for now.  - Schedule EGD - Take zofran as needed for nausea - Attempt to decrease use of opiates - Continue Prevacid 30 mg daily  - If negative EGD, will perform CT abdomen and pelvis with IV contrast  All questions were answered.      Harvel Quale, MD Gastroenterology and Hepatology [facility]

## 2020-12-03 NOTE — Progress Notes (Signed)
Kevin Carlson, M.D. Gastroenterology & Hepatology [facility] [address] [place], [place] [address]  Primary Care Physician: Kennieth Rad, MD [facility] [address] [place] [address]  I will communicate my assessment and recommendations to the referring MD via EMR.  Problems: Nausea GERD Short segment Barrett's esophagus Chronic opiate use  History of Present Illness: Kevin Carlson. is a 87 y.o. male with past medical history of arthritis, BPH, skin cancer, atrial fibrillation on anticoagulation, GERD complicated by short segment Barrett's esophagus, hyperlipidemia, hypertension, polymyalgia rheumatica and chronic use of opiates, who presents for follow up of nausea.  The patient was last seen on 12/07/2017. At that time, the patient was being evaluated for an positive stool, had stool testing x3 negative.  Patient comes to the office with his daughter who also helps providing any information..  Patient reports that for the last 6 months he has presented recurrent episodes of belching, which became worse for the last month as he has presented recurrent episodes on a daily basis, usually after eating. He also endorses significant bloating in his abdomen and poor appetite. Denies any abdominal pain, no hematochezia. States that his stools are darker as he has been taking Peptobismol to improve his nausea which slightly helps the nausea. He reports having frequent nausea during the day but no vomiting.  States that the nausea has also led to decreased appetite   Patient used to weigh 160 lb 6 months ago but due to poor oral intake he has lost close to 12 lb.   Takes Prevacid every day, which control his GERD episodes adequately.  He has been taking his medication compliantly and denies having any side effects with it.  Denies any heartburn, dysphagia or odynophagia.  Patient has been Norco for rheumatoid  arthritis and back pain for multiple years.  Denies drinking or smoking since 1976. Brother had prostate cancer.  The patient denies having any fever, chills, hematochezia, melena, hematemesis, abdominal pain, diarrhea, jaundice, pruritus.  Last EGD: 11/18/2016, short segment Barrett's esophagus with maximal disease of 1cm (biopsies consistent with Barrett's esophagus, short segment), 4 cm hiatal hernia, normal stomach, normal duodenum. Last Colonoscopy: 11/18/2016, 3 7 to 12 mm polyps were located in the cecum which were removed with piecemeal hot snare.  2 hemostatic clips were placed.  There was present diverticulosis.  External hemorrhoids.  Pathology consistent with tubular adenomas. Patient developed post polypectomy bleeding that was monitored in the hospital overnight without requirement of any intubation but required transfusion of 2 units.   Past Medical History: Past Medical History:  Diagnosis Date   Anemia    Arthritis    BPH (benign prostatic hyperplasia)    Cancer ([place])    Skin Cancer- face   Complication of anesthesia 2011   " bleeding from Esophagus after extubated, had to have an endo   Constipation due to opioid therapy    no current problems per patient 03/06/19   DDD (degenerative disc disease), cervical    Dysrhythmia    afib   GERD (gastroesophageal reflux disease)    History of blood transfusion    History of hiatal hernia    History of kidney stones    passed   Hyperlipemia    Hypertension    Nasal congestion    Neuropathy    pt denies this dx 03/06/19   Osteopenia    Pneumonia    approx 2015   Polymyalgia rheumatica ([place])  Past Surgical History: Past Surgical History:  Procedure Laterality Date   ANTERIOR LAT LUMBAR FUSION Right 03/07/2019   Procedure: Right Lumbar Two-Three Anterolateral Lumbar Interbody Fusion with Lateral Plate;  Surgeon: Erline Levine, MD;  Location: [place];  Service: Neurosurgery;  Laterality: Right;  Right Lumbar 2-3  Anterolateral lumbar interbody fusion with lateral plate   APPENDECTOMY  1951   BACK SURGERY     x2   BIOPSY  11/18/2016   Procedure: BIOPSY;  Surgeon: Rogene Houston, MD;  [facility];  Service: Endoscopy;;  esophagus   CARDIAC CATHETERIZATION     yrs ago, no blockages per patient    [place]   after appendectomy   COLONOSCOPY N/A 11/18/2016   Procedure: COLONOSCOPY;  Surgeon: Rogene Houston, MD;  [facility];  Service: Endoscopy;  Laterality: N/A;   COLONOSCOPY W/ POLYPECTOMY     ESOPHAGOGASTRODUODENOSCOPY N/A 11/18/2016   Procedure: ESOPHAGOGASTRODUODENOSCOPY (EGD);  Surgeon: Rogene Houston, MD;  [facility];  Service: Endoscopy;  Laterality: N/A;  12:10   EYE SURGERY Bilateral    cataract   FINGER SURGERY Right    index- pinned   HERNIA REPAIR Right    HERNIA REPAIR Right 1948   KNEE SURGERY Right 1974   "cleaned it out" laparoscopy   LUMBAR LAMINECTOMY/DECOMPRESSION MICRODISCECTOMY N/A 03/20/2015   Procedure: Decompressive lumbar laminectomy L3-L5 ;  Surgeon: Erline Levine, MD;  Location: [place] [facility];  Service: Neurosurgery;  Laterality: N/A;   LUMBAR LAMINECTOMY/DECOMPRESSION MICRODISCECTOMY Bilateral 02/26/2019   Procedure: REDO LUMBAR LAMINECTOMY AND DISCECTOMY BILATERALLY LUMBAR TWO- LUMBAR THREE RIGHT WITH REVISION OF LUMBAR TWO-THREE FUSION WITH POSTEROLATERAL ARTHRODESIS;  Surgeon: Erline Levine, MD;  Location: [place];  Service: Neurosurgery;  Laterality: Bilateral;   POLYPECTOMY  11/18/2016   Procedure: POLYPECTOMY;  Surgeon: Rogene Houston, MD;  [facility];  Service: Endoscopy;;  colon    REPLACEMENT TOTAL KNEE Left 2011   REPLACEMENT TOTAL KNEE Right 2006   TONSILLECTOMY      Family History: Family History  Problem Relation Age of Onset   Hypertension Mother    Hypertension Father    Stroke Father    Heart failure Father    Colon cancer Neg Hx     Social  History: Social History   Tobacco Use  Smoking Status Former   Years: 25.00   Types: Cigarettes   Quit date: 05/02/1974   Years since quitting: 46.6  Smokeless Tobacco Never  Tobacco Comments   quit smoking in 1976   Social History   Substance and Sexual Activity  Alcohol Use No   Comment: None since 1999   Social History   Substance and Sexual Activity  Drug Use No    Allergies: No Known Allergies  Medications: Current Outpatient Medications  Medication Sig Dispense Refill   albuterol (ACCUNEB) 1.25 MG/3ML nebulizer solution Take 1 ampule by nebulization every 6 (six) hours as needed for wheezing.     amLODipine-benazepril (LOTREL) 5-10 MG capsule Take 1 capsule by mouth daily.     apixaban (ELIQUIS) 5 MG TABS tablet Take 5 mg by mouth 2 (two) times daily.     diphenhydramine-acetaminophen (TYLENOL PM) 25-500 MG TABS tablet Take 2 tablets by mouth at bedtime as needed (sleep).     ergocalciferol (VITAMIN D2) 1.25 MG (50000 UT) capsule Take 50,000 Units by mouth every Monday.     gabapentin (NEURONTIN) 800 MG tablet Take  800 mg by mouth 2 (two) times daily.     guaiFENesin (MUCINEX) 600 MG 12 hr tablet Take 600 mg by mouth 2 (two) times daily as needed (congestion).     HYDROcodone-acetaminophen (NORCO) 10-325 MG tablet Take 1 tablet by mouth every 6 (six) hours as needed for moderate pain. 60 tablet 0   lansoprazole (PREVACID) 30 MG capsule Take 30 mg by mouth daily.      methocarbamol (ROBAXIN) 500 MG tablet Take 1 tablet (500 mg total) by mouth every 6 (six) hours as needed for muscle spasms. 60 tablet 1   metoprolol succinate (TOPROL-XL) 25 MG 24 hr tablet Take 25 mg by mouth daily.     predniSONE (DELTASONE) 5 MG tablet Take 5 mg by mouth daily with breakfast.     simvastatin (ZOCOR) 40 MG tablet Take 40 mg by mouth daily.     sodium chloride (OCEAN) 0.65 % SOLN nasal spray Place 1 spray into both nostrils at bedtime as needed for congestion.     tamsulosin (FLOMAX)  0.4 MG CAPS capsule Take 0.4 mg by mouth daily.      tiZANidine (ZANAFLEX) 4 MG tablet Take 4 mg by mouth at bedtime.      No current facility-administered medications for this visit.    Review of Systems: GENERAL: negative for malaise, night sweats HEENT: No changes in hearing or vision, no nose bleeds or other nasal problems. NECK: Negative for lumps, goiter, pain and significant neck swelling RESPIRATORY: Negative for cough, wheezing CARDIOVASCULAR: Negative for chest pain, leg swelling, palpitations, orthopnea GI: SEE HPI MUSCULOSKELETAL: Negative for joint pain or swelling, back pain, and muscle pain. SKIN: Negative for lesions, rash PSYCH: Negative for sleep disturbance, mood disorder and recent psychosocial stressors. HEMATOLOGY Negative for prolonged bleeding, bruising easily, and swollen nodes. ENDOCRINE: Negative for cold or heat intolerance, polyuria, polydipsia and goiter. NEURO: negative for tremor, gait imbalance, syncope and seizures. The remainder of the review of systems is noncontributory.   Physical Exam: BP 100/62 (BP Location: Left Arm, Patient Position: Sitting, Cuff Size: Small)   Pulse (!) 101   Temp 98.2 F (36.8 C) (Oral)   Ht '5\' 11"'$  (1.803 m)   Wt 148 lb 9.6 oz (67.4 kg)   BMI 20.73 kg/m  GENERAL: The patient is AO x3, in no acute distress. Sitting in wheelchair. HEENT: Head is normocephalic and atraumatic. EOMI are intact. Mouth is well hydrated and without lesions. NECK: Supple. No masses LUNGS: Clear to auscultation. No presence of rhonchi/wheezing/rales. Adequate chest expansion HEART: RRR, normal s1 and s2. ABDOMEN: Soft, nontender but with discomfort upon epigastric palpation, no guarding, no peritoneal signs, and nondistended. BS +. No masses. EXTREMITIES: Without any cyanosis, clubbing, rash, lesions or edema. NEUROLOGIC: AOx3, no focal motor deficit. SKIN: no jaundice, no rashes  Imaging/Labs: as above  I personally reviewed and  interpreted the available labs, imaging and endoscopic files.  Impression and Plan: Kevin Vito. is a 87 y.o. male with past medical history of arthritis, BPH, skin cancer, atrial fibrillation on anticoagulation, GERD complicated by short segment Barrett's esophagus, hyperlipidemia, hypertension, polymyalgia rheumatica and chronic use of opiates, who presents for follow up of nausea and bloating.  The patient has presented worsening symptoms which have actually been concerning due to the significant weight loss due to appetite changes he has presented.  Given his age and significant unintentional weight loss we will schedule an EGD to evaluate this further.  If this is unremarkable, we we will need  to consider performing cross-sectional abdominal imaging.  I explained to the patient and his daughter that it is possible his chronic opiate intake is causing his symptomatology, ideally he should decrease the intake of these medications.  For now he can continue taking Zofran as needed. In terms of his acid reflux, this is well controlled on Prevacid and he should continue taking the same dose for now.  - Schedule EGD - Take zofran as needed for nausea - Attempt to decrease use of opiates - Continue Prevacid 30 mg daily  - If negative EGD, will perform CT abdomen and pelvis with IV contrast  All questions were answered.      Kevin Quale, MD Gastroenterology and Hepatology [facility]

## 2020-12-03 NOTE — Patient Instructions (Addendum)
Schedule EGD Take zofran as needed for nausea Attempt to decrease use of opiates Continue Prevacid

## 2020-12-04 NOTE — Patient Instructions (Signed)
Kevin Carlson.  12/04/2020     '@PREFPERIOPPHARMACY'$ @   Your procedure is scheduled on  12/08/2020.   Report to [facility] at  0930 AM   Call this number if you have problems the morning of surgery:  [phone]   Remember:  Follow the diet and prep instructions given to you by the office.     Use your nebulizer before you come. If you have a rescue inhaler, bring that with you.     Your last dose of eliquis should have been 12/05/2020.     Take these medicines the morning of surgery with A SIP OF WATER         amlodipine, gabapentin, hydrocodone (if needed), prevacid, robaxin, metoprolol, zofran (if needed), prednisone, flomax.      Do not wear jewelry, make-up or nail polish.  Do not wear lotions, powders, or perfumes, or deodorant.  Do not shave 48 hours prior to surgery.  Men may shave face and neck.  Do not bring valuables to the hospital.  [facility] is not responsible for any belongings or valuables.  Contacts, dentures or bridgework may not be worn into surgery.  Leave your suitcase in the car.  After surgery it may be brought to your room.  For patients admitted to the hospital, discharge time will be determined by your treatment team.  Patients discharged the day of surgery will not be allowed to drive home and must have someone with them for 24 hours.    Special instructions:   DO NOT smoke tobacco or vape for 24 hours before your procedure.  Please read over the following fact sheets that you were given. Anesthesia Post-op Instructions and Care and Recovery After Surgery      Upper Endoscopy, Adult, Care After This sheet gives you information about how to care for yourself after your procedure. Your health care provider may also give you more specific instructions. If you have problems or questions, contact your health careprovider. What can I expect after the procedure? After the procedure, it is common to have: A sore throat. Mild stomach  pain or discomfort. Bloating. Nausea. Follow these instructions at home:  Follow instructions from your health care provider about what to eat or drink after your procedure. Return to your normal activities as told by your health care provider. Ask your health care provider what activities are safe for you. Take over-the-counter and prescription medicines only as told by your health care provider. If you were given a sedative during the procedure, it can affect you for several hours. Do not drive or operate machinery until your health care provider says that it is safe. Keep all follow-up visits as told by your health care provider. This is important. Contact a health care provider if you have: A sore throat that lasts longer than one day. Trouble swallowing. Get help right away if: You vomit blood or your vomit looks like coffee grounds. You have: A fever. Bloody, black, or tarry stools. A severe sore throat or you cannot swallow. Difficulty breathing. Severe pain in your chest or abdomen. Summary After the procedure, it is common to have a sore throat, mild stomach discomfort, bloating, and nausea. If you were given a sedative during the procedure, it can affect you for several hours. Do not drive or operate machinery until your health care provider says that it is safe. Follow instructions from your health care provider about what to eat or drink  after your procedure. Return to your normal activities as told by your health care provider. This information is not intended to replace advice given to you by your health care provider. Make sure you discuss any questions you have with your healthcare provider. Document Revised: 04/16/2019 Document Reviewed: 09/18/2017 Elsevier Patient Education  2022 [employer] After This sheet gives you information about how to care for yourself after your procedure. Your health care provider may also give you more  specific instructions. If you have problems or questions, contact your health careprovider. What can I expect after the procedure? After the procedure, it is common to have: Tiredness. Forgetfulness about what happened after the procedure. Impaired judgment for important decisions. Nausea or vomiting. Some difficulty with balance. Follow these instructions at home: For the time period you were told by your health care provider:     Rest as needed. Do not participate in activities where you could fall or become injured. Do not drive or use machinery. Do not drink alcohol. Do not take sleeping pills or medicines that cause drowsiness. Do not make important decisions or sign legal documents. Do not take care of children on your own. Eating and drinking Follow the diet that is recommended by your health care provider. Drink enough fluid to keep your urine pale yellow. If you vomit: Drink water, juice, or soup when you can drink without vomiting. Make sure you have little or no nausea before eating solid foods. General instructions Have a responsible adult stay with you for the time you are told. It is important to have someone help care for you until you are awake and alert. Take over-the-counter and prescription medicines only as told by your health care provider. If you have sleep apnea, surgery and certain medicines can increase your risk for breathing problems. Follow instructions from your health care provider about wearing your sleep device: Anytime you are sleeping, including during daytime naps. While taking prescription pain medicines, sleeping medicines, or medicines that make you drowsy. Avoid smoking. Keep all follow-up visits as told by your health care provider. This is important. Contact a health care provider if: You keep feeling nauseous or you keep vomiting. You feel light-headed. You are still sleepy or having trouble with balance after 24 hours. You develop a  rash. You have a fever. You have redness or swelling around the IV site. Get help right away if: You have trouble breathing. You have new-onset confusion at home. Summary For several hours after your procedure, you may feel tired. You may also be forgetful and have poor judgment. Have a responsible adult stay with you for the time you are told. It is important to have someone help care for you until you are awake and alert. Rest as told. Do not drive or operate machinery. Do not drink alcohol or take sleeping pills. Get help right away if you have trouble breathing, or if you suddenly become confused. This information is not intended to replace advice given to you by your health care provider. Make sure you discuss any questions you have with your healthcare provider. Document Revised: 01/02/2020 Document Reviewed: 03/21/2019 Elsevier Patient Education  2022 [employer].

## 2020-12-07 ENCOUNTER — Other Ambulatory Visit: Payer: Self-pay

## 2020-12-07 ENCOUNTER — Encounter (HOSPITAL_COMMUNITY): Payer: Self-pay

## 2020-12-07 ENCOUNTER — Encounter (HOSPITAL_COMMUNITY)
Admission: RE | Admit: 2020-12-07 | Discharge: 2020-12-07 | Disposition: A | Discharge status: Home / Self Care | Payer: Medicare Other | Source: Ambulatory Visit | Attending: Gastroenterology | Admitting: Gastroenterology

## 2020-12-07 DIAGNOSIS — Z01818 Encounter for other preprocedural examination: Secondary | ICD-10-CM | POA: Diagnosis present

## 2020-12-07 LAB — BASIC METABOLIC PANEL
Anion gap: 6 (ref 5–15)
BUN: 21 mg/dL (ref 8–23)
CO2: 23 mmol/L (ref 22–32)
Calcium: 8 mg/dL — ABNORMAL LOW (ref 8.9–10.3)
Chloride: 104 mmol/L (ref 98–111)
Creatinine, Ser: 1.29 mg/dL — ABNORMAL HIGH (ref 0.61–1.24)
GFR, Estimated: 53 mL/min — ABNORMAL LOW (ref >60)
Glucose, Bld: 94 mg/dL (ref 70–99)
Potassium: 4.2 mmol/L (ref 3.5–5.1)
Sodium: 133 mmol/L — ABNORMAL LOW (ref 135–145)

## 2020-12-07 LAB — CBC WITH DIFFERENTIAL/PLATELET
Abs Immature Granulocytes: 0.32 10*3/uL — ABNORMAL HIGH (ref 0.00–0.07)
Basophils Absolute: 0.1 10*3/uL (ref 0.0–0.1)
Basophils Relative: 1 %
Eosinophils Absolute: 0.1 10*3/uL (ref 0.0–0.5)
Eosinophils Relative: 1 %
HCT: 30.5 % — ABNORMAL LOW (ref 39.0–52.0)
Hemoglobin: 9.5 g/dL — ABNORMAL LOW (ref 13.0–17.0)
Immature Granulocytes: 3 %
Lymphocytes Relative: 9 %
Lymphs Abs: 1.1 10*3/uL (ref 0.7–4.0)
MCH: 32 pg (ref 26.0–34.0)
MCHC: 31.1 g/dL (ref 30.0–36.0)
MCV: 102.7 fL — ABNORMAL HIGH (ref 80.0–100.0)
Monocytes Absolute: 1 10*3/uL (ref 0.1–1.0)
Monocytes Relative: 8 %
Neutro Abs: 9.5 10*3/uL — ABNORMAL HIGH (ref 1.7–7.7)
Neutrophils Relative %: 78 %
Platelets: 347 10*3/uL (ref 150–400)
RBC: 2.97 MIL/uL — ABNORMAL LOW (ref 4.22–5.81)
RDW: 19.5 % — ABNORMAL HIGH (ref 11.5–15.5)
WBC: 12 10*3/uL — ABNORMAL HIGH (ref 4.0–10.5)
nRBC: 0 % (ref 0.0–0.2)

## 2020-12-08 ENCOUNTER — Encounter (HOSPITAL_COMMUNITY)
Admission: RE | Disposition: A | Discharge status: Home / Self Care | Payer: Self-pay | Source: Home / Self Care | Attending: Gastroenterology

## 2020-12-08 ENCOUNTER — Ambulatory Visit (HOSPITAL_COMMUNITY)
Admission: RE | Admit: 2020-12-08 | Discharge: 2020-12-08 | Disposition: A | Discharge status: Home / Self Care | Payer: Medicare Other | Attending: Gastroenterology | Admitting: Gastroenterology

## 2020-12-08 ENCOUNTER — Telehealth (INDEPENDENT_AMBULATORY_CARE_PROVIDER_SITE_OTHER): Payer: Self-pay | Admitting: *Deleted

## 2020-12-08 ENCOUNTER — Encounter (HOSPITAL_COMMUNITY): Payer: Self-pay | Admitting: Gastroenterology

## 2020-12-08 ENCOUNTER — Ambulatory Visit (HOSPITAL_COMMUNITY): Payer: Medicare Other | Admitting: Certified Registered"

## 2020-12-08 ENCOUNTER — Other Ambulatory Visit: Payer: Self-pay

## 2020-12-08 DIAGNOSIS — M069 Rheumatoid arthritis, unspecified: Secondary | ICD-10-CM | POA: Insufficient documentation

## 2020-12-08 DIAGNOSIS — Z85828 Personal history of other malignant neoplasm of skin: Secondary | ICD-10-CM | POA: Diagnosis not present

## 2020-12-08 DIAGNOSIS — Z8719 Personal history of other diseases of the digestive system: Secondary | ICD-10-CM | POA: Insufficient documentation

## 2020-12-08 DIAGNOSIS — K219 Gastro-esophageal reflux disease without esophagitis: Secondary | ICD-10-CM | POA: Insufficient documentation

## 2020-12-08 DIAGNOSIS — Z87891 Personal history of nicotine dependence: Secondary | ICD-10-CM | POA: Insufficient documentation

## 2020-12-08 DIAGNOSIS — M353 Polymyalgia rheumatica: Secondary | ICD-10-CM | POA: Insufficient documentation

## 2020-12-08 DIAGNOSIS — I10 Essential (primary) hypertension: Secondary | ICD-10-CM | POA: Diagnosis not present

## 2020-12-08 DIAGNOSIS — Z79899 Other long term (current) drug therapy: Secondary | ICD-10-CM | POA: Insufficient documentation

## 2020-12-08 DIAGNOSIS — E785 Hyperlipidemia, unspecified: Secondary | ICD-10-CM | POA: Insufficient documentation

## 2020-12-08 DIAGNOSIS — R110 Nausea: Secondary | ICD-10-CM | POA: Insufficient documentation

## 2020-12-08 DIAGNOSIS — M549 Dorsalgia, unspecified: Secondary | ICD-10-CM | POA: Diagnosis not present

## 2020-12-08 DIAGNOSIS — Z96653 Presence of artificial knee joint, bilateral: Secondary | ICD-10-CM | POA: Insufficient documentation

## 2020-12-08 DIAGNOSIS — R140 Abdominal distension (gaseous): Secondary | ICD-10-CM | POA: Diagnosis not present

## 2020-12-08 DIAGNOSIS — Z7952 Long term (current) use of systemic steroids: Secondary | ICD-10-CM | POA: Diagnosis not present

## 2020-12-08 DIAGNOSIS — Z7901 Long term (current) use of anticoagulants: Secondary | ICD-10-CM | POA: Diagnosis not present

## 2020-12-08 DIAGNOSIS — I4891 Unspecified atrial fibrillation: Secondary | ICD-10-CM | POA: Diagnosis not present

## 2020-12-08 DIAGNOSIS — K449 Diaphragmatic hernia without obstruction or gangrene: Secondary | ICD-10-CM

## 2020-12-08 DIAGNOSIS — N400 Enlarged prostate without lower urinary tract symptoms: Secondary | ICD-10-CM | POA: Insufficient documentation

## 2020-12-08 DIAGNOSIS — Z79891 Long term (current) use of opiate analgesic: Secondary | ICD-10-CM | POA: Diagnosis not present

## 2020-12-08 HISTORY — PX: ESOPHAGOGASTRODUODENOSCOPY (EGD) WITH PROPOFOL: SHX5813

## 2020-12-08 HISTORY — PX: BIOPSY: SHX5522

## 2020-12-08 SURGERY — ESOPHAGOGASTRODUODENOSCOPY (EGD) WITH PROPOFOL
Anesthesia: General

## 2020-12-08 MED ORDER — PHENYLEPHRINE 40 MCG/ML (10ML) SYRINGE FOR IV PUSH (FOR BLOOD PRESSURE SUPPORT)
PREFILLED_SYRINGE | INTRAVENOUS | Status: DC | PRN
  Administered 2020-12-08 (×2): 80 ug via INTRAVENOUS

## 2020-12-08 MED ORDER — PROPOFOL 10 MG/ML IV BOLUS
INTRAVENOUS | Status: DC | PRN
  Administered 2020-12-08: 30 mg via INTRAVENOUS
  Administered 2020-12-08: 80 mg via INTRAVENOUS

## 2020-12-08 MED ORDER — STERILE WATER FOR IRRIGATION IR SOLN
Status: DC | PRN
  Administered 2020-12-08: 100 mL

## 2020-12-08 MED ORDER — PHENYLEPHRINE 40 MCG/ML (10ML) SYRINGE FOR IV PUSH (FOR BLOOD PRESSURE SUPPORT)
PREFILLED_SYRINGE | INTRAVENOUS | Status: AC
  Filled 2020-12-08: qty 10

## 2020-12-08 MED ORDER — LIDOCAINE HCL (CARDIAC) PF 100 MG/5ML IV SOSY
PREFILLED_SYRINGE | INTRAVENOUS | Status: DC | PRN
  Administered 2020-12-08: 50 mg via INTRAVENOUS

## 2020-12-08 MED ORDER — LACTATED RINGERS IV SOLN: INTRAVENOUS | Status: DC

## 2020-12-08 NOTE — Anesthesia Procedure Notes (Signed)
Date/Time: 12/08/2020 11:02 AM Performed by: Orlie Dakin, CRNA Pre-anesthesia Checklist: Patient identified, Suction available, Emergency Drugs available and Patient being monitored Patient Re-evaluated:Patient Re-evaluated prior to induction Oxygen Delivery Method: Nasal cannula Induction Type: IV induction Placement Confirmation: positive ETCO2

## 2020-12-08 NOTE — Anesthesia Preprocedure Evaluation (Addendum)
Anesthesia Evaluation  Patient identified by MRN, date of birth, ID band Patient awake    Reviewed: Allergy & Precautions, NPO status , Patient's Chart, lab work & pertinent test results, reviewed documented beta blocker date and time   History of Anesthesia Complications Negative for: history of anesthetic complications  Airway Mallampati: II  TM Distance: >3 FB Neck ROM: Full    Dental no notable dental hx. (+) Dental Advisory Given   Pulmonary former smoker,    Pulmonary exam normal        Cardiovascular hypertension, Pt. on medications and Pt. on home beta blockers Normal cardiovascular exam+ dysrhythmias (on Eliquis, last dose 03/04/19) Atrial Fibrillation      Neuro/Psych  Headaches, negative psych ROS   GI/Hepatic Neg liver ROS, hiatal hernia, GERD  Controlled and Medicated,  Endo/Other  Hyperthyroidism   Renal/GU Renal InsufficiencyRenal disease  negative genitourinary   Musculoskeletal  (+) Arthritis , Polymyalgia rheumatica   Abdominal   Peds  Hematology  (+) anemia , Hgb 10.9   Anesthesia Other Findings   Reproductive/Obstetrics negative OB ROS                            Anesthesia Physical  Anesthesia Plan  ASA: 3  Anesthesia Plan: General   Post-op Pain Management:    Induction: Intravenous  PONV Risk Score and Plan: 3 and Treatment may vary due to age or medical condition, Ondansetron and TIVA  Airway Management Planned:   Additional Equipment: None  Intra-op Plan:   Post-operative Plan:   Informed Consent: I have reviewed the patients History and Physical, chart, labs and discussed the procedure including the risks, benefits and alternatives for the proposed anesthesia with the patient or authorized representative who has indicated his/her understanding and acceptance.       Plan Discussed with: CRNA  Anesthesia Plan Comments:         Anesthesia  Quick Evaluation

## 2020-12-08 NOTE — Telephone Encounter (Signed)
Per EGD op note - Schedule abdominopelvic CT scan with IV contrast

## 2020-12-08 NOTE — Discharge Instructions (Addendum)
You are being discharged to home.  Resume your previous diet.  We are waiting for your pathology results.  Schedule abdominopelvic CT scan with IV contrast.  PATIENT INSTRUCTIONS POST-ANESTHESIA  IMMEDIATELY FOLLOWING SURGERY:  Do not drive or operate machinery for the first twenty four hours after surgery.  Do not make any important decisions for twenty four hours after surgery or while taking narcotic pain medications or sedatives.  If you develop intractable nausea and vomiting or a severe headache please notify your doctor immediately.  FOLLOW-UP:  Please make an appointment with your surgeon as instructed. You do not need to follow up with anesthesia unless specifically instructed to do so.  WOUND CARE INSTRUCTIONS (if applicable):  Keep a dry clean dressing on the anesthesia/puncture wound site if there is drainage.  Once the wound has quit draining you may leave it open to air.  Generally you should leave the bandage intact for twenty four hours unless there is drainage.  If the epidural site drains for more than 36-48 hours please call the anesthesia department.  QUESTIONS?:  Please feel free to call your physician or the hospital operator if you have any questions, and they will be happy to assist you.

## 2020-12-08 NOTE — Anesthesia Postprocedure Evaluation (Signed)
Anesthesia Post Note  Patient: Kevin Carlson.  Procedure(s) Performed: ESOPHAGOGASTRODUODENOSCOPY (EGD) WITH PROPOFOL BIOPSY  Patient location during evaluation: PACU Anesthesia Type: General Level of consciousness: awake and alert Pain management: pain level controlled Vital Signs Assessment: post-procedure vital signs reviewed and stable Respiratory status: spontaneous breathing, nonlabored ventilation, respiratory function stable and patient connected to nasal cannula oxygen Cardiovascular status: blood pressure returned to baseline and stable Postop Assessment: no apparent nausea or vomiting Anesthetic complications: no   No notable events documented.   Last Vitals:  Vitals:   12/08/20 0945 12/08/20 1116  BP: (!) 164/92 (!) 104/51  Pulse: 66 67  Resp: 20 17  Temp: 36.6 C 36.6 C  SpO2: 97% 100%    Last Pain:  Vitals:   12/08/20 1116  TempSrc: Oral  PainSc: 0-No pain                 Nicanor Alcon

## 2020-12-08 NOTE — Interval H&P Note (Signed)
History and Physical Interval Note:  12/08/2020 10:08 AM Kevin Dessert Castulo Imel. is a 87 y.o. male with past medical history of arthritis, BPH, skin cancer, atrial fibrillation on anticoagulation, GERD complicated by short segment Barrett's esophagus, hyperlipidemia, hypertension, polymyalgia rheumatica and chronic use of opiates, who presents for follow up of nausea and bloating.   Patient reports that this nausea has improved on its own but he reports that he has bloating frequently, which varies depending on the type of food he eats. Denies any nausea, vomiting, fever, chills, melena or hemaotchezia.  BP (!) 164/92   Pulse 66   Temp 97.9 F (36.6 C) (Oral)   Resp 20   Ht '6\' 2"'$  (1.88 m)   Wt 67.1 kg   SpO2 97%   BMI 19.00 kg/m  GENERAL: The patient is AO x3, in no acute distress. HEENT: Head is normocephalic and atraumatic. EOMI are intact. Mouth is well hydrated and without lesions. NECK: Supple. No masses LUNGS: Clear to auscultation. No presence of rhonchi/wheezing/rales. Adequate chest expansion HEART: RRR, normal s1 and s2. ABDOMEN: Soft, nontender, no guarding, no peritoneal signs, and nondistended. BS +. No masses. EXTREMITIES: Without any cyanosis, clubbing, rash, lesions or edema. NEUROLOGIC: AOx3, no focal motor deficit. SKIN: no jaundice, no rashes  Kevin Carlson.  has presented today for surgery, with the diagnosis of Belching Epigastric Pain Weight Loss.  The various methods of treatment have been discussed with the patient and family. After consideration of risks, benefits and other options for treatment, the patient has consented to  Procedure(s): ESOPHAGOGASTRODUODENOSCOPY (EGD) WITH PROPOFOL (N/A) as a surgical intervention.  The patient's history has been reviewed, patient examined, no change in status, stable for surgery.  I have reviewed the patient's chart and labs.  Questions were answered to the patient's satisfaction.     Kevin Carlson

## 2020-12-08 NOTE — Op Note (Signed)
[facility] Patient Name: Kevin Carlson Procedure Date: 12/08/2020 10:51 AM MRN: [id number] Date of Birth: Feb 15, 1930 Attending MD: [name] ,  CSN: [id number] Age: 87 Admit Type: Outpatient Procedure:                Upper GI endoscopy Indications:              Abdominal bloating, Nausea Providers:                [name], Lambert Mody Raphael Gibney,                            Technician Referring MD:              Medicines:                Monitored Anesthesia Care Complications:            No immediate complications. Estimated Blood Loss:     Estimated blood loss: none. Procedure:                Pre-Anesthesia Assessment:                           - Prior to the procedure, a History and Physical                            was performed, and patient medications, allergies                            and sensitivities were reviewed. The patient's                            tolerance of previous anesthesia was reviewed.                           - The risks and benefits of the procedure and the                            sedation options and risks were discussed with the                            patient. All questions were answered and informed                            consent was obtained.                           - ASA Grade Assessment: III - A patient with severe                            systemic disease.                           After obtaining informed consent, the endoscope was                            passed under direct vision. Throughout the  procedure, the patient's blood pressure, pulse, and                            oxygen saturations were monitored continuously. The                            [id number] scope was introduced through the                            mouth, and advanced to the second part of duodenum.                            The upper GI endoscopy was accomplished without                             difficulty. The patient tolerated the procedure                            well. Scope In: 11:05:32 AM Scope Out: 11:11:19 AM Total Procedure Duration: 0 hours 5 minutes 47 seconds  Findings:      A 2 cm hiatal hernia was present.      The entire examined stomach was normal.      The examined duodenum was normal. Biopsies were taken with a cold       forceps for histology. Impression:               - 2 cm hiatal hernia.                           - Normal stomach.                           - Normal examined duodenum. Biopsied. Moderate Sedation:      Per Anesthesia Care Recommendation:           - Discharge patient to home (ambulatory).                           - Resume previous diet.                           - Await pathology results.                           - Schedule abdominopelvic CT scan with IV contrast. Procedure Code(s):        --- Professional ---                           [phone], Esophagogastroduodenoscopy, flexible,                            transoral; with biopsy, single or multiple Diagnosis Code(s):        --- Professional ---                           K44.9, Diaphragmatic hernia without obstruction or  gangrene                           R14.0, Abdominal distension (gaseous)                           R11.0, Nausea CPT copyright 2019 American Medical Association. All rights reserved. The codes documented in this report are preliminary and upon coder review may  be revised to meet current compliance requirements. [name], MD [name],  12/08/2020 11:20:00 AM This report has been signed electronically. Number of Addenda: 0

## 2020-12-08 NOTE — Transfer of Care (Signed)
Immediate Anesthesia Transfer of Care Note  Patient: Kevin Carlson.  Procedure(s) Performed: ESOPHAGOGASTRODUODENOSCOPY (EGD) WITH PROPOFOL BIOPSY  Patient Location: Short Stay  Anesthesia Type:General  Level of Consciousness: awake and oriented  Airway & Oxygen Therapy: Patient Spontanous Breathing  Post-op Assessment: Report given to RN and Post -op Vital signs reviewed and stable  Post vital signs: Reviewed and stable  Last Vitals:  Vitals Value Taken Time  BP    Temp    Pulse    Resp    SpO2      Last Pain:  Vitals:   12/08/20 1059  TempSrc:   PainSc: 0-No pain      Patients Stated Pain Goal: 8 ([id number])  Complications: No notable events documented.

## 2020-12-09 LAB — SURGICAL PATHOLOGY

## 2020-12-15 ENCOUNTER — Other Ambulatory Visit (INDEPENDENT_AMBULATORY_CARE_PROVIDER_SITE_OTHER): Payer: Self-pay

## 2020-12-15 DIAGNOSIS — R140 Abdominal distension (gaseous): Secondary | ICD-10-CM

## 2020-12-15 DIAGNOSIS — R110 Nausea: Secondary | ICD-10-CM

## 2020-12-15 DIAGNOSIS — R112 Nausea with vomiting, unspecified: Secondary | ICD-10-CM

## 2020-12-16 ENCOUNTER — Encounter (HOSPITAL_COMMUNITY): Payer: Self-pay | Admitting: Gastroenterology

## 2021-01-06 ENCOUNTER — Other Ambulatory Visit: Payer: Self-pay

## 2021-01-06 ENCOUNTER — Encounter (HOSPITAL_COMMUNITY): Payer: Self-pay | Admitting: Radiology

## 2021-01-06 ENCOUNTER — Ambulatory Visit (HOSPITAL_COMMUNITY)
Admission: RE | Admit: 2021-01-06 | Discharge: 2021-01-06 | Disposition: A | Discharge status: Home / Self Care | Payer: Medicare Other | Source: Ambulatory Visit | Attending: Gastroenterology | Admitting: Gastroenterology

## 2021-01-06 DIAGNOSIS — R110 Nausea: Secondary | ICD-10-CM | POA: Insufficient documentation

## 2021-01-06 DIAGNOSIS — R140 Abdominal distension (gaseous): Secondary | ICD-10-CM | POA: Insufficient documentation

## 2021-01-06 DIAGNOSIS — R112 Nausea with vomiting, unspecified: Secondary | ICD-10-CM | POA: Diagnosis present

## 2021-01-06 MED ORDER — IOHEXOL 350 MG/ML SOLN
100.0000 mL | Freq: Once | INTRAVENOUS | Status: AC | PRN
  Administered 2021-01-06: 80 mL via INTRAVENOUS

## 2021-03-18 ENCOUNTER — Ambulatory Visit (INDEPENDENT_AMBULATORY_CARE_PROVIDER_SITE_OTHER): Payer: Medicare Other | Admitting: Gastroenterology

## 2021-04-05 ENCOUNTER — Ambulatory Visit (INDEPENDENT_AMBULATORY_CARE_PROVIDER_SITE_OTHER): Payer: Medicare Other | Admitting: Gastroenterology

## 2021-04-05 ENCOUNTER — Other Ambulatory Visit: Payer: Self-pay

## 2021-04-05 ENCOUNTER — Telehealth (INDEPENDENT_AMBULATORY_CARE_PROVIDER_SITE_OTHER): Payer: Self-pay | Admitting: *Deleted

## 2021-04-05 ENCOUNTER — Encounter (INDEPENDENT_AMBULATORY_CARE_PROVIDER_SITE_OTHER): Payer: Self-pay | Admitting: Gastroenterology

## 2021-04-05 VITALS — BP 152/81 | HR 112 | Temp 97.8°F | Ht 73.0 in | Wt 163.7 lb

## 2021-04-05 DIAGNOSIS — K219 Gastro-esophageal reflux disease without esophagitis: Secondary | ICD-10-CM | POA: Diagnosis not present

## 2021-04-05 DIAGNOSIS — R634 Abnormal weight loss: Secondary | ICD-10-CM

## 2021-04-05 NOTE — Progress Notes (Signed)
Referring Provider: Kennieth Rad, MD Primary Care Physician:  Kennieth Rad, MD Primary GI Physician: Jenetta Downer  Chief Complaint  Patient presents with   Follow-up    Pt states he was about 195 before and had lost a lot of weight. Weight on 12/03/20 was 148. Today 163.7. eating better now. Still having pain in mid low back.    HPI:   Kevin Carlson. is a 87 y.o. male with past medical history of anemia, arthritis, BPH, skin cancer, DDD, GERD, hyperlipemia, HTN, Osteopenia, Polymyalgia rheumatica.  Patient presenting today for follow up of weight loss.  Last seen in clinic 12/03/20, at that time he had lost approx 12 pounds over the past 6 months, he endorsed a lot of belching and nausea, for which he underwent EGD 12/08/20 without significant findings.he then underwent CT A/P 01/06/21 for further evaluation of weight loss and nausea, CT was without acute findings to explain his symptoms.   Today he reports that he is eating better, weight is 163.7, last weight was 148 on 12/03/20. He is maintained on prevacid 30mg  daily for GERD. He reports that he is eating two meals per day, usually just breakfast and dinner. Nausea has improved, he is no longer using Zofran and feels that nausea is very infrequent at this time. He denies any abdominal pain.  No dysphagia or odynophagia. He reports that his acid reflux is very well controlled on prevacid once daily. He occasionally takes  a tums If he has breakthrough reflux, however, this is needed very infrequently. He Denies constipation or diarrhea. He typically has a BM once per day. Denies blood in stools or black stools. He is still taking 1-2 Norco per day for chronic back pain and continues to have issues with his back, he is followed by Dr. Vertell Limber for this.  Last Colonoscopy: 11/18/16 three 7-12 mm plyps in cecum, removed piecemeal hot snare, 2 hemostatic clips placed, diverticulosis, external hemorrhoids, path showed tubular adenomas, he developed post  polypectomy bleeding which required overnight hospitalization and 2 units PRBCs. Last Endoscopy:12/08/20- 2 cm hiatal hernia. - Normal stomach. - Normal examined duodenum. (Benign small bowel mucosa)  Past Medical History:  Diagnosis Date   Anemia    Arthritis    BPH (benign prostatic hyperplasia)    Cancer (HCC)    Skin Cancer- face   Complication of anesthesia 2011   " bleeding from Esophagus after extubated, had to have an endo   Constipation due to opioid therapy    no current problems per patient 03/06/19   DDD (degenerative disc disease), cervical    Dysrhythmia    afib   GERD (gastroesophageal reflux disease)    History of blood transfusion    History of hiatal hernia    History of kidney stones    passed   Hyperlipemia    Hypertension    Nasal congestion    Neuropathy    pt denies this dx 03/06/19   Osteopenia    Pneumonia    approx 2015   Polymyalgia rheumatica ([place])     Past Surgical History:  Procedure Laterality Date   ANTERIOR LAT LUMBAR FUSION Right 03/07/2019   Procedure: Right Lumbar Two-Three Anterolateral Lumbar Interbody Fusion with Lateral Plate;  Surgeon: Erline Levine, MD;  Location: [place];  Service: Neurosurgery;  Laterality: Right;  Right Lumbar 2-3 Anterolateral lumbar interbody fusion with lateral plate   APPENDECTOMY  1951   BACK SURGERY     x2   BIOPSY  11/18/2016  Procedure: BIOPSY;  Surgeon: Rogene Houston, MD;  [facility];  Service: Endoscopy;;  esophagus   BIOPSY  12/08/2020   Procedure: BIOPSY;  Surgeon: Montez Morita, Quillian Quince, MD;  [facility];  Service: Gastroenterology;;   CARDIAC CATHETERIZATION     yrs ago, no blockages per patient    [place]   after appendectomy   COLONOSCOPY N/A 11/18/2016   Procedure: COLONOSCOPY;  Surgeon: Rogene Houston, MD;  [facility];  Service: Endoscopy;  Laterality: N/A;   COLONOSCOPY W/ POLYPECTOMY      ESOPHAGOGASTRODUODENOSCOPY N/A 11/18/2016   Procedure: ESOPHAGOGASTRODUODENOSCOPY (EGD);  Surgeon: Rogene Houston, MD;  [facility];  Service: Endoscopy;  Laterality: N/A;  12:10   ESOPHAGOGASTRODUODENOSCOPY (EGD) WITH PROPOFOL N/A 12/08/2020   Procedure: ESOPHAGOGASTRODUODENOSCOPY (EGD) WITH PROPOFOL;  Surgeon: Harvel Quale, MD;  [facility];  Service: Gastroenterology;  Laterality: N/A;   EYE SURGERY Bilateral    cataract   FINGER SURGERY Right    index- pinned   HERNIA REPAIR Right    HERNIA REPAIR Right 1948   KNEE SURGERY Right 1974   "cleaned it out" laparoscopy   LUMBAR LAMINECTOMY/DECOMPRESSION MICRODISCECTOMY N/A 03/20/2015   Procedure: Decompressive lumbar laminectomy L3-L5 ;  Surgeon: Erline Levine, MD;  Location: [facility];  Service: Neurosurgery;  Laterality: N/A;   LUMBAR LAMINECTOMY/DECOMPRESSION MICRODISCECTOMY Bilateral 02/26/2019   Procedure: REDO LUMBAR LAMINECTOMY AND DISCECTOMY BILATERALLY LUMBAR TWO- LUMBAR THREE RIGHT WITH REVISION OF LUMBAR TWO-THREE FUSION WITH POSTEROLATERAL ARTHRODESIS;  Surgeon: Erline Levine, MD;  Location: [facility];  Service: Neurosurgery;  Laterality: Bilateral;   POLYPECTOMY  11/18/2016   Procedure: POLYPECTOMY;  Surgeon: Rogene Houston, MD;  [facility];  Service: Endoscopy;;  colon    REPLACEMENT TOTAL KNEE Left 2011   REPLACEMENT TOTAL KNEE Right 2006   TONSILLECTOMY      Current Outpatient Medications  Medication Sig Dispense Refill   albuterol (ACCUNEB) 1.25 MG/3ML nebulizer solution Take 1 ampule by nebulization every 6 (six) hours as needed for wheezing.     apixaban (ELIQUIS) 5 MG TABS tablet Take 5 mg by mouth 2 (two) times daily.     diphenhydramine-acetaminophen (TYLENOL PM) 25-500 MG TABS tablet Take 2 tablets by mouth at bedtime as needed (sleep).     gabapentin (NEURONTIN) 800 MG tablet Take 800 mg by mouth 2 (two) times daily.     HYDROcodone-acetaminophen (NORCO) 10-325 MG  tablet Take 1 tablet by mouth every 6 (six) hours as needed for moderate pain. 60 tablet 0   lansoprazole (PREVACID) 30 MG capsule Take 30 mg by mouth daily.      methocarbamol (ROBAXIN) 500 MG tablet Take 1 tablet (500 mg total) by mouth every 6 (six) hours as needed for muscle spasms. 60 tablet 1   metoprolol succinate (TOPROL-XL) 25 MG 24 hr tablet Take 25 mg by mouth daily.     predniSONE (DELTASONE) 5 MG tablet Take 5 mg by mouth daily with breakfast.     simvastatin (ZOCOR) 40 MG tablet Take 40 mg by mouth daily.     tamsulosin (FLOMAX) 0.4 MG CAPS capsule Take 0.4 mg by mouth daily.      tiZANidine (ZANAFLEX) 4 MG tablet Take 4 mg by mouth at bedtime.      amLODipine-benazepril (LOTREL) 5-10 MG capsule Take 1 capsule by mouth daily. (Patient not taking: Reported on 04/05/2021)     No  current facility-administered medications for this visit.    Allergies as of 04/05/2021   (No Known Allergies)    Family History  Problem Relation Age of Onset   Hypertension Mother    Hypertension Father    Stroke Father    Heart failure Father    Colon cancer Neg Hx     Social History   Socioeconomic History   Marital status: Widowed    Spouse name: Not on file   Number of children: Not on file   Years of education: Not on file   Highest education level: Not on file  Occupational History   Not on file  Tobacco Use   Smoking status: Former    Years: 25.00    Types: Cigarettes    Quit date: 05/02/1974    Years since quitting: 46.9   Smokeless tobacco: Never   Tobacco comments:    quit smoking in 1976  Vaping Use   Vaping Use: Never used  Substance and Sexual Activity   Alcohol use: No    Comment: None since 1999   Drug use: No   Sexual activity: Not on file  Other Topics Concern   Not on file  Social History Narrative   Not on file   Social Determinants of Health   Financial Resource Strain: Not on file  Food Insecurity: Not on file  Transportation Needs: Not on file   Physical Activity: Not on file  Stress: Not on file  Social Connections: Not on file   Review of systems General: negative for malaise, night sweats, fever, chills, weight loss Neck: Negative for lumps, goiter, pain and significant neck swelling Resp: Negative for cough, wheezing, dyspnea at rest CV: Negative for chest pain, leg swelling, palpitations, orthopnea GI: denies melena, hematochezia, nausea, vomiting, diarrhea, constipation, dysphagia, odyonophagia, early satiety or unintentional weight loss.  MSK: Negative for joint pain or swelling, back pain, and muscle pain. Derm: Negative for itching or rash Psych: Denies depression, anxiety, memory loss, confusion. No homicidal or suicidal ideation.  Heme: Negative for prolonged bleeding, bruising easily, and swollen nodes. Endocrine: Negative for cold or heat intolerance, polyuria, polydipsia and goiter. Neuro: negative for tremor, gait imbalance, syncope and seizures. The remainder of the review of systems is noncontributory.  Physical Exam: BP (!) 152/81 (BP Location: Left Arm, Patient Position: Sitting, Cuff Size: Large)   Pulse (!) 112   Temp 97.8 F (36.6 C) (Oral)   Ht 6\' 1"  (1.854 m)   Wt 163 lb 11.2 oz (74.3 kg)   BMI 21.60 kg/m  General:   Alert and oriented. No distress noted. Pleasant and cooperative.  Head:  Normocephalic and atraumatic. Eyes:  Conjuctiva clear without scleral icterus. Mouth:  Oral mucosa pink and moist. Good dentition. No lesions. Heart: normal rate, irregular rhythm r/t a fib Lungs: Clear lung sounds in all lobes. Respirations equal and unlabored. Abdomen:  +BS, soft, non-tender and non-distended. No rebound or guarding. No HSM or masses noted. Derm: No palmar erythema or jaundice Msk:  Symmetrical without gross deformities. Normal posture. Extremities:  Without edema. Neurologic:  Alert and  oriented x4 Psych:  Alert and cooperative. Normal mood and affect.  Invalid input(s): 6 MONTHS    ASSESSMENT: Kevin Carlson. is a 87 y.o. male presenting today for follow up of weight loss and nausea.  Weight has improved since august, and nausea has now subsided, he has gained approx 15 lbs over the past 4 months. EGD and CT A/P done previously for  evaluation of nausea and weight loss were unremarkable for etiologic findings. Symptoms likely related to chronic opiate use for chronic back pain. His appetite reassuringly has improved, as has his weight. He can add boost or ensure shakes between meals to help support nutrition since he is only eating 2 meals per day. He has no alarm symptoms.  GERD is well controlled on prevacid 30mg  once daily, with very occasional breakthrough reflux that he takes a tums for, he will let me know if this becomes more frequent.   PLAN:  Continue prevacid 30mg  daily 2. Can try boost or ensure between meals to support weight/nutrition 3. Pt to let me know if he develops any new symptoms  Follow Up: 1 year  Jacqualine Weichel L. Alver Sorrow, MSN, APRN, AGNP-C Adult-Gerontology Nurse Practitioner [facility]

## 2021-04-05 NOTE — Telephone Encounter (Signed)
Pt was in today for office visit. After leaving office he came back in and told receptionist he fell in elevator that is in the hallway. Crystal Sutton CMA  and I went to lobby and took him back to exam room. States he tripped over crack in elevator and feel and hurt right arm. Took off his coat and his right arm near elbow was a skin tear and it was bleeding. Crystal got chelsea NP that had seen him today to come check him. I cleaned the area and put some antibiotic ointment on it and a bandaid. After [facility] NP finished I asked him if he wanted me to walk with him down stairs to parking lot and he states he is ok to go alone but I walked with him anyways to the elevator and rode elevator down with him and walked with him to the parking lot.

## 2021-04-05 NOTE — Patient Instructions (Signed)
Continue prevacid 30mg  once daily Can try boost or ensure protein shakes in between meals to help with weight/nutrition. We will plan to see you again in 1 year, if you develop any new GI symptoms before then, please let us know

## 2022-04-01 DEATH — deceased

## 2023-09-05 IMAGING — CT CT ABD-PELV W/ CM
2 of 5 series · 15 of 46 positions shown, 17 images · IV contrast (Omnipaque or Isovue)
Comparison: None

CLINICAL DATA: Nausea, vomiting, bloating, dark stool, excessive
bulging and poor appetite for 6 months, GERD, hypertension, BPH,
former smoker

EXAM:
CT ABDOMEN AND PELVIS WITH CONTRAST
TECHNIQUE: Multidetector CT imaging of the abdomen and pelvis was performed
using the standard protocol following bolus administration of
intravenous contrast. Sagittal and coronal MPR images reconstructed
from axial data set.
CONTRAST:  80mL OMNIPAQUE IOHEXOL 350 MG/ML SOLN IV. Dilute oral
contrast.

[Series 2: axial st · axial · 0.70mm/px · z∈[+833,+1228]mm · 12 of 89 slices shown, 14 images]
[im 5/89  soft-tissue]
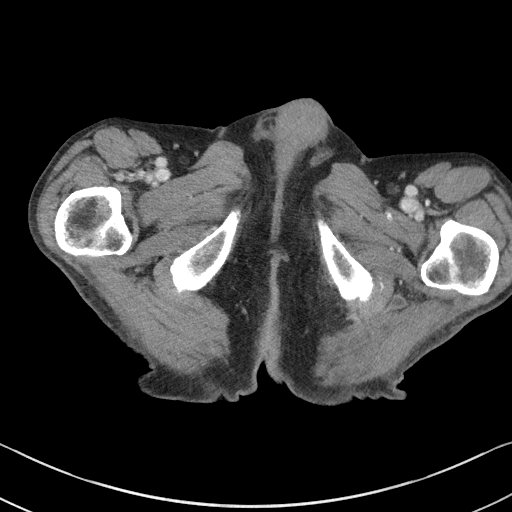
[im 5/89  bone]
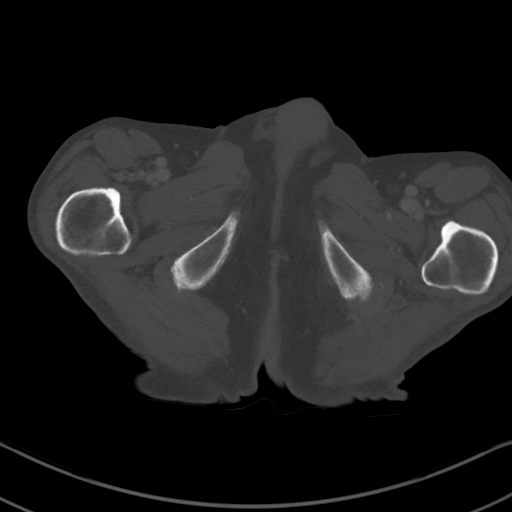
[im 14/89  soft-tissue]
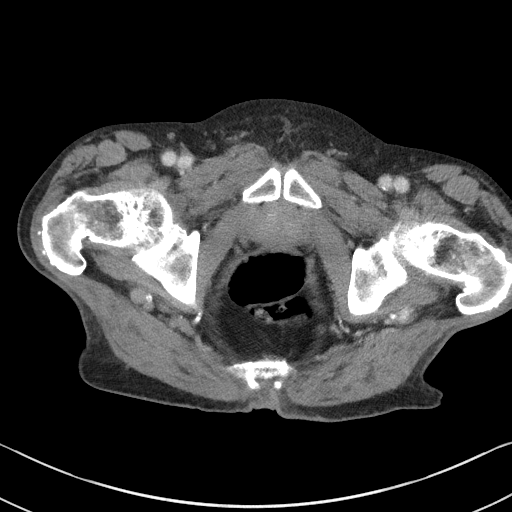
[im 19/89  soft-tissue]
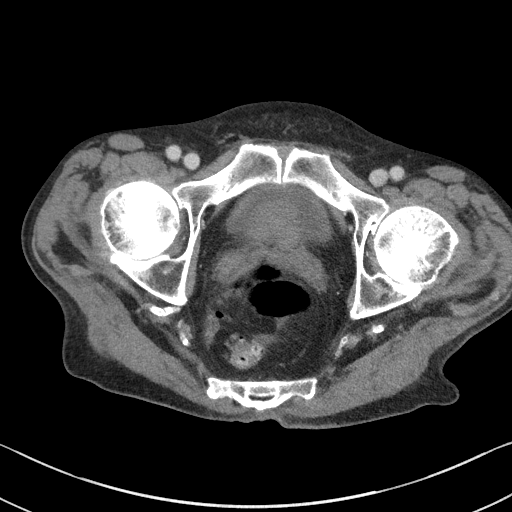
[im 28/89  soft-tissue]
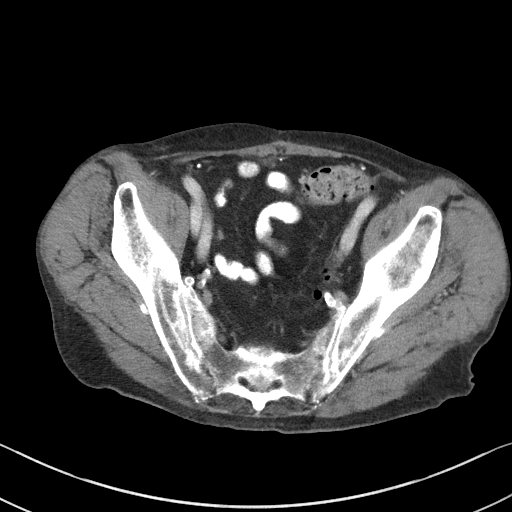
[im 33/89  soft-tissue]
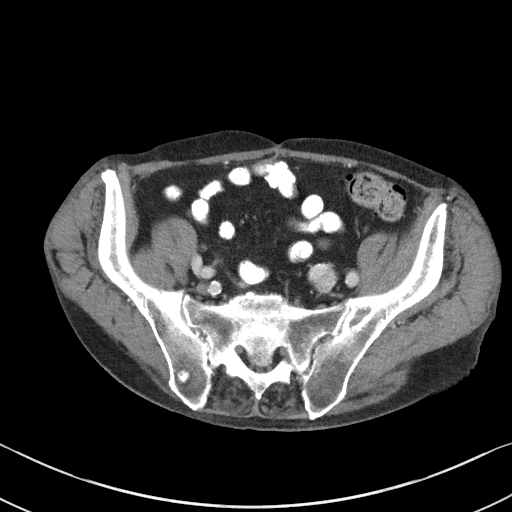
[im 42/89  soft-tissue]
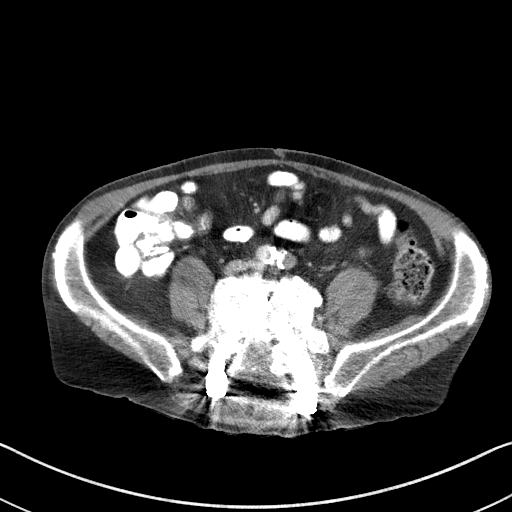
[im 47/89  soft-tissue]
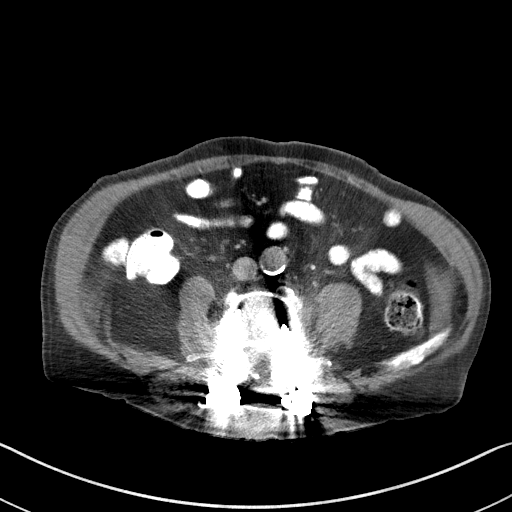
[im 56/89  soft-tissue]
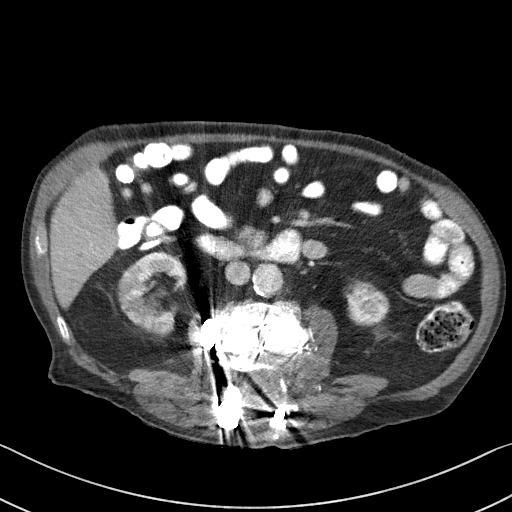
[im 61/89  soft-tissue]
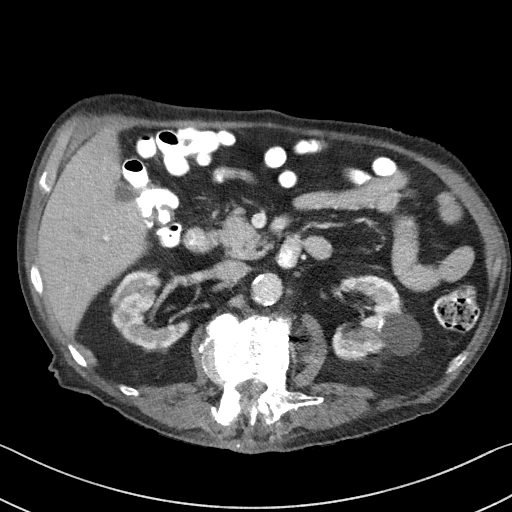
[im 61/89  bone]
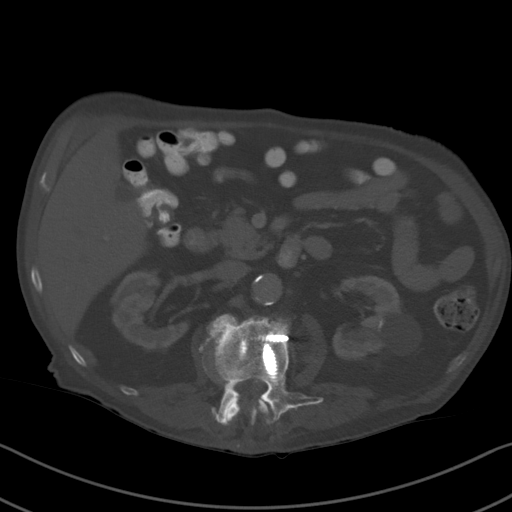
[im 70/89  soft-tissue]
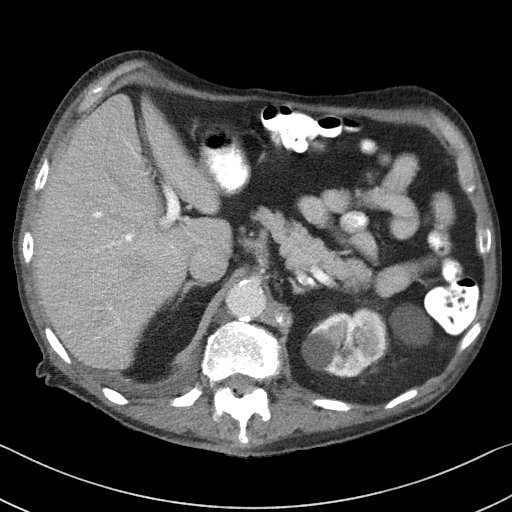
[im 75/89  soft-tissue]
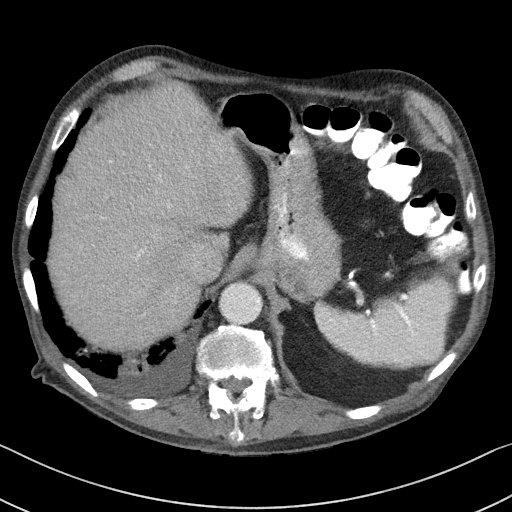
[im 84/89  soft-tissue]
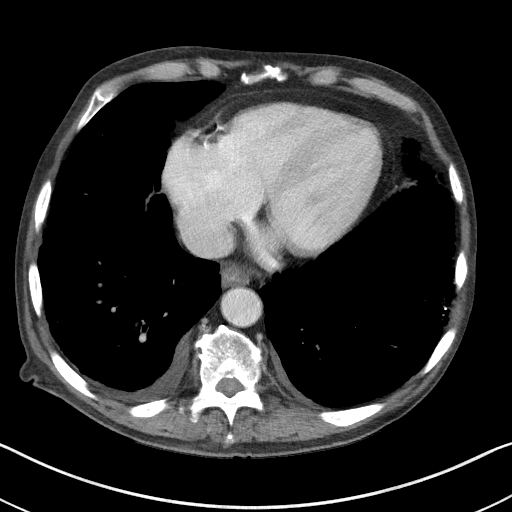

[Series 5: coronal st · coronal · 0.72mm/px · 3 of 100 slices shown]
[im 34/100  soft-tissue]
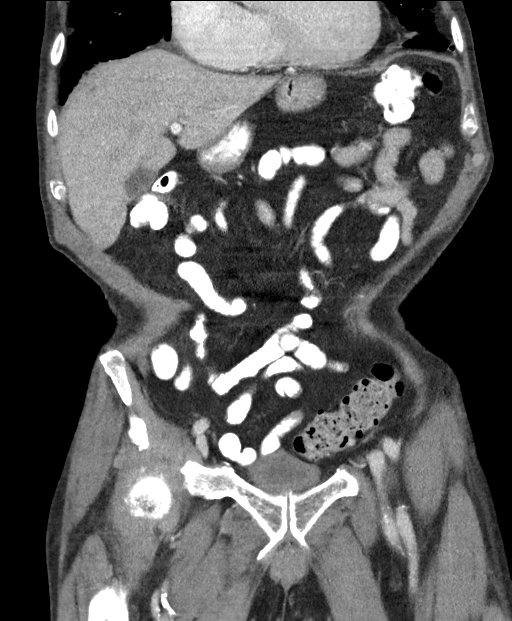
[im 45/100  soft-tissue]
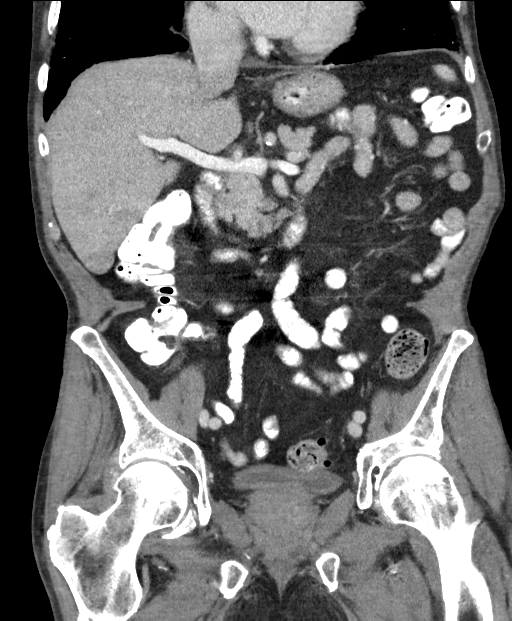
[im 56/100  soft-tissue]
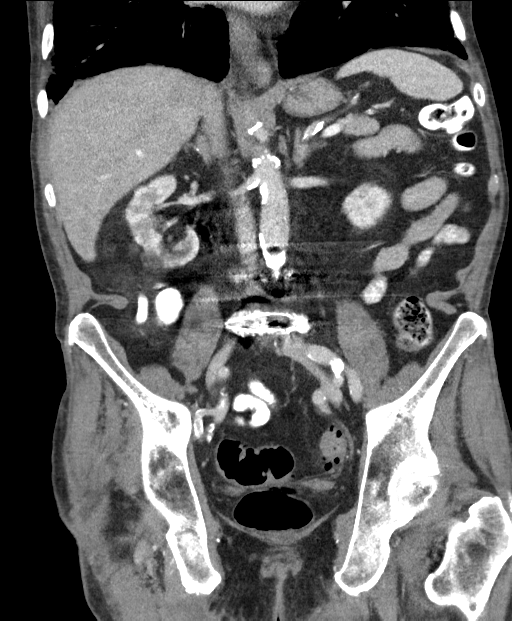

[15 of 46 positions shown; findings below may reference images not displayed]

FINDINGS: Lower chest: Emphysematous and bronchitic changes at lung bases with
peripheral chronic interstitial lung disease and progressive patchy
bibasilar opacities which could be inflammatory or infectious. Small
BILATERAL pleural effusions larger on RIGHT.

Hepatobiliary: Gallbladder and liver normal appearance

Pancreas: Normal appearance

Spleen: Normal appearance.  Small splenule inferior to spleen.

Adrenals/Urinary Tract: Adrenal glands normal appearance. Cortical
thinning of both kidneys. Multiple LEFT and single RIGHT renal cysts
up to 4.6 cm greatest size. Single cyst at mid inferior LEFT kidney
demonstrates mural calcification. No solid renal masses,
hydronephrosis, or calculi. Ureters and bladder unremarkable.

Stomach/Bowel: Appendix surgically absent by history. Sigmoid
diverticulosis without evidence of diverticulitis. Large and small
bowel loops otherwise normal appearance. Stomach underdistended with
small hiatal hernia, unable to exclude proximal to mid gastric wall
thickening with this appearance.

Vascular/Lymphatic: Atherosclerotic calcifications aorta, iliac
arteries, coronary arteries, and the visceral artery origins. Aorta
normal caliber. No adenopathy. Mild enlargement of cardiac chambers.

Reproductive: Minimal prostatic enlargement.

Other: No free air or free fluid. No hernia or inflammatory process.

Musculoskeletal: Multilevel degenerative disc disease, facet
disease, and postsurgical changes of the lumbar spine. Osseous
demineralization. AP narrowing of spinal canal at inferior L1 due to
bulky spurs especially on LEFT extending into LEFT foramen.
IMPRESSION: Sigmoid diverticulosis without evidence of diverticulitis.

Small hiatal hernia with suboptimal assessment of gastric wall
thickness due to underdistention.

Chronic interstitial lung disease changes at lung bases with
progressive patchy bibasilar opacities which could be inflammatory
or infectious in etiology.

Small BILATERAL pleural effusions larger on RIGHT.

No acute intra-abdominal or intrapelvic abnormalities.

BILATERAL renal cysts, single cyst in LEFT kidney demonstrating a
segment of mural calcification which has increased since the
previous exam; consider follow-up renal ultrasound assessment if
clinically indicated based on age and comorbidities.

Extensive atherosclerotic calcifications including the origins of
the visceral arteries.

Aortic Atherosclerosis (UD1ZX-NOP.P) and Emphysema (UD1ZX-1ZS.V).
